# Patient Record
Sex: Male | Born: 1945 | Race: White | Hispanic: No | Marital: Married | Smoking: Former smoker
Health system: Southern US, Community
[De-identification: ages and names within clinical notes are randomized; demographics above are authoritative.]

## PROBLEM LIST (undated history)

## (undated) DIAGNOSIS — Z8739 Personal history of other diseases of the musculoskeletal system and connective tissue: Secondary | ICD-10-CM

## (undated) DIAGNOSIS — R7303 Prediabetes: Secondary | ICD-10-CM

## (undated) DIAGNOSIS — I7121 Aneurysm of the ascending aorta, without rupture: Secondary | ICD-10-CM

## (undated) DIAGNOSIS — Z951 Presence of aortocoronary bypass graft: Secondary | ICD-10-CM

## (undated) DIAGNOSIS — D6851 Activated protein C resistance: Secondary | ICD-10-CM

## (undated) DIAGNOSIS — Z9889 Other specified postprocedural states: Secondary | ICD-10-CM

## (undated) DIAGNOSIS — F1021 Alcohol dependence, in remission: Secondary | ICD-10-CM

## (undated) DIAGNOSIS — M1990 Unspecified osteoarthritis, unspecified site: Secondary | ICD-10-CM

## (undated) DIAGNOSIS — I712 Thoracic aortic aneurysm, without rupture: Secondary | ICD-10-CM

## (undated) DIAGNOSIS — Z86718 Personal history of other venous thrombosis and embolism: Secondary | ICD-10-CM

## (undated) DIAGNOSIS — K5904 Chronic idiopathic constipation: Secondary | ICD-10-CM

## (undated) DIAGNOSIS — N201 Calculus of ureter: Secondary | ICD-10-CM

## (undated) DIAGNOSIS — Z8546 Personal history of malignant neoplasm of prostate: Secondary | ICD-10-CM

## (undated) DIAGNOSIS — E119 Type 2 diabetes mellitus without complications: Secondary | ICD-10-CM

## (undated) DIAGNOSIS — I5032 Chronic diastolic (congestive) heart failure: Secondary | ICD-10-CM

## (undated) DIAGNOSIS — D3500 Benign neoplasm of unspecified adrenal gland: Secondary | ICD-10-CM

## (undated) DIAGNOSIS — Z85828 Personal history of other malignant neoplasm of skin: Secondary | ICD-10-CM

## (undated) DIAGNOSIS — Z923 Personal history of irradiation: Secondary | ICD-10-CM

## (undated) DIAGNOSIS — Z7901 Long term (current) use of anticoagulants: Secondary | ICD-10-CM

## (undated) DIAGNOSIS — Z86010 Personal history of colonic polyps: Secondary | ICD-10-CM

## (undated) DIAGNOSIS — R310 Gross hematuria: Secondary | ICD-10-CM

## (undated) DIAGNOSIS — E042 Nontoxic multinodular goiter: Secondary | ICD-10-CM

## (undated) DIAGNOSIS — IMO0002 Reserved for concepts with insufficient information to code with codable children: Secondary | ICD-10-CM

## (undated) DIAGNOSIS — I10 Essential (primary) hypertension: Secondary | ICD-10-CM

## (undated) DIAGNOSIS — I2510 Atherosclerotic heart disease of native coronary artery without angina pectoris: Secondary | ICD-10-CM

## (undated) DIAGNOSIS — Z860101 Personal history of adenomatous and serrated colon polyps: Secondary | ICD-10-CM

## (undated) DIAGNOSIS — Z9189 Other specified personal risk factors, not elsewhere classified: Secondary | ICD-10-CM

## (undated) DIAGNOSIS — K627 Radiation proctitis: Secondary | ICD-10-CM

## (undated) DIAGNOSIS — I482 Chronic atrial fibrillation, unspecified: Secondary | ICD-10-CM

## (undated) HISTORY — PX: CARDIOVASCULAR STRESS TEST: SHX262

## (undated) HISTORY — PX: TONSILLECTOMY: SUR1361

## (undated) HISTORY — PX: CORONARY ARTERY BYPASS GRAFT: SHX141

## (undated) HISTORY — PX: KNEE SURGERY: SHX244

## (undated) HISTORY — DX: Chronic idiopathic constipation: K59.04

## (undated) HISTORY — PX: TRANSTHORACIC ECHOCARDIOGRAM: SHX275

## (undated) HISTORY — PX: CARDIAC CATHETERIZATION: SHX172

## (undated) HISTORY — DX: Type 2 diabetes mellitus without complications: E11.9

## (undated) HISTORY — DX: Radiation proctitis: K62.7

---

## 1970-11-24 HISTORY — PX: OTHER SURGICAL HISTORY: SHX169

## 1998-04-26 ENCOUNTER — Ambulatory Visit (HOSPITAL_COMMUNITY): Admission: RE | Admit: 1998-04-26 | Discharge: 1998-04-26 | Payer: Self-pay | Admitting: Interventional Cardiology

## 1998-11-24 HISTORY — PX: HEMORRHOID SURGERY: SHX153

## 1999-03-02 ENCOUNTER — Emergency Department (HOSPITAL_COMMUNITY): Admission: EM | Admit: 1999-03-02 | Discharge: 1999-03-03 | Payer: Self-pay | Admitting: Emergency Medicine

## 1999-03-14 ENCOUNTER — Ambulatory Visit (HOSPITAL_COMMUNITY): Admission: RE | Admit: 1999-03-14 | Discharge: 1999-03-14 | Payer: Self-pay | Admitting: Orthopedic Surgery

## 1999-06-14 ENCOUNTER — Encounter: Payer: Self-pay | Admitting: Emergency Medicine

## 1999-06-14 ENCOUNTER — Emergency Department (HOSPITAL_COMMUNITY): Admission: EM | Admit: 1999-06-14 | Discharge: 1999-06-14 | Payer: Self-pay | Admitting: Emergency Medicine

## 1999-09-03 ENCOUNTER — Ambulatory Visit (HOSPITAL_COMMUNITY): Admission: RE | Admit: 1999-09-03 | Discharge: 1999-09-03 | Payer: Self-pay | Admitting: Interventional Cardiology

## 1999-09-14 ENCOUNTER — Encounter: Payer: Self-pay | Admitting: Emergency Medicine

## 1999-09-14 ENCOUNTER — Inpatient Hospital Stay (HOSPITAL_COMMUNITY): Admission: EM | Admit: 1999-09-14 | Discharge: 1999-09-22 | Payer: Self-pay | Admitting: Emergency Medicine

## 1999-09-16 ENCOUNTER — Encounter: Payer: Self-pay | Admitting: Cardiothoracic Surgery

## 1999-09-17 ENCOUNTER — Encounter: Payer: Self-pay | Admitting: Cardiothoracic Surgery

## 1999-09-18 ENCOUNTER — Encounter: Payer: Self-pay | Admitting: Cardiothoracic Surgery

## 1999-09-19 ENCOUNTER — Encounter: Payer: Self-pay | Admitting: Cardiothoracic Surgery

## 1999-10-08 ENCOUNTER — Encounter (HOSPITAL_COMMUNITY): Admission: RE | Admit: 1999-10-08 | Discharge: 2000-01-06 | Payer: Self-pay | Admitting: Interventional Cardiology

## 2000-01-07 ENCOUNTER — Encounter (HOSPITAL_COMMUNITY): Admission: RE | Admit: 2000-01-07 | Discharge: 2000-01-24 | Payer: Self-pay | Admitting: Interventional Cardiology

## 2000-02-21 ENCOUNTER — Ambulatory Visit (HOSPITAL_COMMUNITY): Admission: RE | Admit: 2000-02-21 | Discharge: 2000-02-21 | Payer: Self-pay | Admitting: Critical Care Medicine

## 2000-02-21 ENCOUNTER — Encounter: Payer: Self-pay | Admitting: Critical Care Medicine

## 2000-08-03 ENCOUNTER — Encounter: Payer: Self-pay | Admitting: Gastroenterology

## 2000-08-03 ENCOUNTER — Encounter: Admission: RE | Admit: 2000-08-03 | Discharge: 2000-08-03 | Payer: Self-pay | Admitting: Gastroenterology

## 2000-10-12 ENCOUNTER — Ambulatory Visit (HOSPITAL_COMMUNITY): Admission: RE | Admit: 2000-10-12 | Discharge: 2000-10-12 | Payer: Self-pay | Admitting: Gastroenterology

## 2000-10-12 ENCOUNTER — Encounter (INDEPENDENT_AMBULATORY_CARE_PROVIDER_SITE_OTHER): Payer: Self-pay | Admitting: Specialist

## 2002-08-09 ENCOUNTER — Encounter (INDEPENDENT_AMBULATORY_CARE_PROVIDER_SITE_OTHER): Payer: Self-pay | Admitting: *Deleted

## 2002-08-09 ENCOUNTER — Ambulatory Visit (HOSPITAL_BASED_OUTPATIENT_CLINIC_OR_DEPARTMENT_OTHER): Admission: RE | Admit: 2002-08-09 | Discharge: 2002-08-09 | Payer: Self-pay | Admitting: Orthopedic Surgery

## 2002-08-09 HISTORY — PX: OTHER SURGICAL HISTORY: SHX169

## 2003-02-16 ENCOUNTER — Encounter: Payer: Self-pay | Admitting: Urology

## 2003-02-20 ENCOUNTER — Inpatient Hospital Stay (HOSPITAL_COMMUNITY): Admission: RE | Admit: 2003-02-20 | Discharge: 2003-02-23 | Payer: Self-pay | Admitting: Urology

## 2003-02-20 ENCOUNTER — Encounter (INDEPENDENT_AMBULATORY_CARE_PROVIDER_SITE_OTHER): Payer: Self-pay | Admitting: Specialist

## 2003-02-20 HISTORY — PX: OTHER SURGICAL HISTORY: SHX169

## 2003-11-24 ENCOUNTER — Ambulatory Visit (HOSPITAL_COMMUNITY): Admission: RE | Admit: 2003-11-24 | Discharge: 2003-11-24 | Payer: Self-pay | Admitting: Gastroenterology

## 2003-11-24 ENCOUNTER — Encounter (INDEPENDENT_AMBULATORY_CARE_PROVIDER_SITE_OTHER): Payer: Self-pay | Admitting: Specialist

## 2007-10-15 ENCOUNTER — Ambulatory Visit (HOSPITAL_COMMUNITY): Admission: RE | Admit: 2007-10-15 | Discharge: 2007-10-15 | Payer: Self-pay | Admitting: Orthopedic Surgery

## 2008-04-04 ENCOUNTER — Ambulatory Visit: Payer: Self-pay | Admitting: Hematology & Oncology

## 2008-04-26 LAB — CBC WITH DIFFERENTIAL/PLATELET
BASO%: 0.5 % (ref 0.0–2.0)
Basophils Absolute: 0 10*3/uL (ref 0.0–0.1)
Eosinophils Absolute: 0 10*3/uL (ref 0.0–0.5)
LYMPH%: 26.9 % (ref 14.0–48.0)
MCHC: 34.3 g/dL (ref 32.0–35.9)
MONO#: 0.4 10*3/uL (ref 0.1–0.9)
MONO%: 7.8 % (ref 0.0–13.0)
NEUT%: 64 % (ref 40.0–75.0)
RBC: 4.96 10*6/uL (ref 4.20–5.71)
RDW: 13.8 % (ref 11.2–14.6)
WBC: 5.1 10*3/uL (ref 4.0–10.0)

## 2008-04-26 LAB — CHCC SMEAR

## 2008-04-28 LAB — PROTEIN ELECTROPHORESIS, SERUM
Albumin ELP: 68 % — ABNORMAL HIGH (ref 55.8–66.1)
Alpha-2-Globulin: 8.1 % (ref 7.1–11.8)
Beta Globulin: 6.6 % (ref 4.7–7.2)
Total Protein, Serum Electrophoresis: 7.1 g/dL (ref 6.0–8.3)

## 2008-04-28 LAB — ANA: Anti Nuclear Antibody(ANA): NEGATIVE

## 2008-04-28 LAB — PLATELET ANTIBODIES, DIRECT: Platelet IgG Ab, Direct: NEGATIVE

## 2008-04-28 LAB — LACTATE DEHYDROGENASE: LDH: 182 U/L (ref 94–250)

## 2008-04-28 LAB — VITAMIN B12: Vitamin B-12: 356 pg/mL (ref 211–911)

## 2008-07-04 ENCOUNTER — Ambulatory Visit: Payer: Self-pay | Admitting: Hematology & Oncology

## 2008-08-28 ENCOUNTER — Ambulatory Visit: Payer: Self-pay | Admitting: Vascular Surgery

## 2008-09-04 ENCOUNTER — Encounter: Admission: RE | Admit: 2008-09-04 | Discharge: 2008-09-04 | Payer: Self-pay | Admitting: Internal Medicine

## 2009-04-28 ENCOUNTER — Emergency Department (HOSPITAL_COMMUNITY): Admission: EM | Admit: 2009-04-28 | Discharge: 2009-04-28 | Payer: Self-pay | Admitting: Emergency Medicine

## 2009-05-07 ENCOUNTER — Ambulatory Visit: Payer: Self-pay | Admitting: Vascular Surgery

## 2009-08-21 ENCOUNTER — Encounter (HOSPITAL_COMMUNITY): Admission: RE | Admit: 2009-08-21 | Discharge: 2009-08-21 | Payer: Self-pay | Admitting: Internal Medicine

## 2011-04-08 NOTE — Procedures (Signed)
DUPLEX DEEP VENOUS EXAM - LOWER EXTREMITY   INDICATION:  Left lower extremity pain and swelling.   HISTORY:  Edema:  Left lower extremity.  Trauma/Surgery:  Larey Seat June 5th on left knee.  Pain:  Left lower extremity.  PE:  No.  Previous DVT:  Bilateral superficial femoral and popliteal vein DVT.  Anticoagulants:  Coumadin.  Other:   DUPLEX EXAM:                CFV   SFV   PopV  PTV    GSV                R  L  R  L  R  L  R   L  R  L  Thrombosis    0  0     +     +      0     0  Spontaneous   +  +     +     +      +     +  Phasic        +  +     +     +      +     +  Augmentation  +  +     +     +      +     +  Compressible  +  +     P     P      +     +  Competent     +  0     0     0            +   Legend:  + - yes  o - no  p - partial  D - decreased   IMPRESSION:  1. 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  2. All other imaged veins appear patent.  3. No significant changes from previous study 08/28/2008.          _____________________________  Janetta Hora. Fields, MD   AS/MEDQ  D:  05/07/2009  T:  05/07/2009  Job:  [id number]   cc:   Claude Manges. Cleophas Dunker, M.D.

## 2011-04-08 NOTE — Procedures (Signed)
DUPLEX DEEP VENOUS EXAM - LOWER EXTREMITY   INDICATION:  Evaluate for DVT.   HISTORY:  Edema:  Left leg swelling when patient flies.  Trauma/Surgery:  No.  Pain:  No.  PE:  No.  Previous DVT:  Bilateral lower extremity DVT 20 years ago.  Anticoagulants:  Yes.  Other:    DUPLEX EXAM:                CFV   SFV   PopV  PTV    GSV                R  L  R  L  R  L  R   L  R  L  Thrombosis    o  o  +  +  +  +  o   o  o  o  Spontaneous   +  +  +  +  +  +  +   +  +  +  Phasic        +  +  +  +  +  +  +   +  +  +  Augmentation  +  +  +  +  +  +  +   +  +  +  Compressible  +  +  P  P  P  P  +   +  +  +  Competent                                 +   Legend:  + - yes  o - no  p - partial  D - decreased   IMPRESSION:  1. No evidence of acute deep venous thrombosis noted in the bilateral      lower extremities.  2. Mild areas of minimal occlusive chronic thrombus noted in the      bilateral superficial femoral and popliteal veins.   Preliminary report was faxed to Dr. Petrinitz's office on 08/28/08.      _____________________________  Janetta Hora. Fields, MD   CH/MEDQ  D:  08/28/2008  T:  08/28/2008  Job:  [id number]

## 2011-04-11 NOTE — Op Note (Signed)
NAME:  Jacob [name], Jacob [name] NO.:  [id number]   MEDICAL RECORD NO.:  [id number]                   PATIENT TYPE:  AMB   LOCATION:  DSC                                  FACILITY:  [facility]   PHYSICIAN:  Katy Fitch. Naaman Plummer., M.D.          DATE OF BIRTH:  1946-02-24   DATE OF PROCEDURE:  08/09/2002  DATE OF DISCHARGE:                                 OPERATIVE REPORT   PREOPERATIVE DIAGNOSIS:  A 2.5 x 1.5 cm subcutaneous, subdermal, and  perifascial mass, left distal brachium.   POSTOPERATIVE DIAGNOSIS:  A 2.5 x 1.5 cm subcutaneous, subdermal, and  perifascial mass, left distal brachium.   PROCEDURE:  Excisional biopsy of mass, left distal brachium.   SURGEON:  Katy Fitch. Sypher, M.D.   ASSISTANT:  Jonni Sanger, P.A.   ANESTHESIA:  1% lidocaine and 0.25% Marcaine field block supplemented by IV  sedation supervised by the anesthesiologist, Janetta Hora. Gelene Mink, M.D.   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   He noticed this while showering several weeks ago and at one point noted  what he thought was some inflammation.   He was evaluated by Barbette Or, M.D., primary care physician, who  placed him on oral Keflex.  On the medication the patient thought his mass  may have become less inflamed; however, the mass became quite firm and  persisted.   He presented for an upper extremity orthopedic consult one week ago and at  that time was advised to observe this for another seven days.   With no significant change in the mass measurement, I recommended proceeding  with excisional biopsy for diagnosis.   His past medical history reveals a history of gout.  He is currently managed  on allopurinol.  He has had no episodes of acute gouty arthropathy or signs  of soft tissue tophus formation in the past.  In addition, he  has chronic  coronary artery disease and is status post coronary artery bypass graft.   DESCRIPTION OF PROCEDURE:  The patient is brought to the operating room and  placed in the supine position on the operating table.   Following light sedation, the left arm was prepped with Betadine soap and  solution and sterilely draped.  A pneumatic tourniquet was applied to the  proximal brachium.   Following exsanguination of the limb with an Esmarch bandage, an arterial  tourniquet on the proximal brachium was inflated to 220 mmHg.  The procedure  commenced with infiltration of 1% lidocaine and 0.25% Marcaine both with  epinephrine into the region of the intended incision and surrounding the  mass.   After waiting for approximately five minutes for proper analgesia, the mass  was exposed through a transverse incision directly over the palpable  fullness.  The subcutaneous tissues were carefully divided, revealing what  appeared to be a moderately firm inflammatory scirrhous mass, adherent to  the deep surface of the dermis and the superficial and deep fascia.  This  was circumferentially dissected and removed as a single entity, placed in  formalin, and passed off for pathologic evaluation.   The wound was then palpated for other satellite masses, and none were  identified.  The wound was then thoroughly lavaged with sterile saline,  subsequently repaired with intradermal 3-0 Prolene and a Steri-Strip.   A pressure dressing was applied with sterile gauze, a sterile ABD pad, and a  four-inch Ace wrap.   The patient was advised to elevate his arm for 48 hours.  He will remove his  dressing in 72 hours and apply a Band-Aid.  We should have the biopsy report  back within 72-96 hours.  Note that he is scheduled to return to our office  for follow-up in one week for suture removal.                                               Katy Fitch. Naaman Plummer., M.D.    RVS/MEDQ  D:  08/09/2002  T:   08/10/2002  Job:  [id number]   cc:   Barbette Or, MD  [address] [place]  [place] [address]  Fax: [phone]

## 2011-04-11 NOTE — Op Note (Signed)
NAME:  Jacob [name], Jacob [name] NO.:  [id number]   MEDICAL RECORD NO.:  [id number]                   PATIENT TYPE:  INP   LOCATION:  X003                                 FACILITY:  [facility]   PHYSICIAN:  Valetta Fuller, M.D.               DATE OF BIRTH:  May 20, 1946   DATE OF PROCEDURE:  DATE OF DISCHARGE:                                 OPERATIVE REPORT   PREOPERATIVE DIAGNOSES:  Clinical stage T1C adenocarcinoma of the prostate.   POSTOPERATIVE DIAGNOSES:  Clinical stage T1C adenocarcinoma of the prostate.   PROCEDURE:  Radical retropubic prostatectomy.   SURGEON:  Valetta Fuller, M.D.   ASSISTANT:  Bertram Millard. Dahlstedt, M.D.   ANESTHESIA:  General endotracheal.   INDICATIONS FOR PROCEDURE:  Mr. More is a 65 year old male. He was  recently noted to have a mildly elevated PSA of approximately 4.2. Digital  rectal exam was unremarkable and he was asymptomatic. The patient  subsequently had ultrasound and biopsy which revealed bilateral  adenocarcinoma of the prostate with a Gleason score of 3+3=6. Biopsies were  positive bilaterally but he had less than 10% of the biopsy material  involved with cancer. Given this PSA of well less than 10 and the Gleason 6  tumor, we did not feel CT and bone scan were indicated. The patient does  have some medical comorbidities but has been assessed and is felt to be a  reasonably good candidate for surgical intervention. The patient underwent  extensive counseling with regard to treatment options and specifically we at  length discussed the pros and cons of radical prostatectomy versus radiation  approach such as interstitial seed implantation. 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 We felt that given his  exceedingly low numbers and very low risk for pelvic lymph node metastases  that we could do away with the  pelvic lymph node dissection which would  shorten the procedure and also avoid dissection of the iliac vessels which  could potentially result in some trauma and increase his risk for DVT. After  much discussion, the patient elected to proceed with radical retropubic  prostatectomy. He had his Coumadin discontinued approximately 5-6 days ago  and has been on a Lovenox bridge for his anticoagulation with his last dose  last evening. His coag status is now normal. His labs are unremarkable and  he present now for the procedure.   TECHNIQUE:  The patient was brought to the operating room where he was  placed in the supine position. Compression beads were placed preoperatively  and compression was initiated before induction of anesthesia. Once  successful induction of general endotracheal anesthesia had occurred, the  patient was then prepped and draped in the usual manner. A Foley catheter  was inserted. We had difficulty establishing drainage  with a straight  catheter and a Coude catheter was applied. A standard lower midline incision  was performed. An Omni retractor was then utilized and the retropubic space  was dissected. Once the bladder was decompressed, we exposed the endopelvic  fascia bilaterally which was then incised. The apex of the prostate was  easily identified and there was no evidence of induration on either side. We  felt that a bilateral nerve sparing approach was reasonable in this case. A  back bleeder stitch to the dorsal vein was utilized. The puboprostatics were  easily identified and incised. A right angled clamp was placed behind the  dorsal vein complex and above the urethra. This was doubly ligated and then  suture ligated. The dorsal vein complex was then incised and a very nice  apical dissection ensued. One could identify the apex out the underlying  urethra. We gently dissected out the proximal urethral stump which was then  transected and the catheter was  brought out the incision. The posterior  aspect of the urethra was then transected and again a nice urethral stump  was retained. A small amount of rectourethralis fascia needed to be incised  utilizing sharp dissection with a right angle underneath that area. The  posterior plane of the prostate was then very easily established and there  was no evidence of difficulty in getting the posterior plane. The endopelvic  fascia was then incised sharply bilaterally with the neurovascular bundles  allowed to release. In the midline, we were able to identify the seminal  vesicle which then allowed [place] to easily establish a plane between the  seminal vesicles and the lateral pedicles of the prostate which were then  either clipped with hemolock clips or tied with silk suture. Once we had all  the pedicles taken down, we turned our attention to the bladder neck.  Utilizing a combination of sharp and blunt dissection technique, we were  able to do a complete bladder neck sparing. Those planes between the  prostate and bladder neck were also normal. The entire circular fibers of  the bladder neck were preserved. In the midline, the vas were identified,  clipped and transected. Both seminal vesicles were then dissected out  completely with the seminal vesicle vessels clipped with locking clips. The  prostate was then removed. Hemostasis was quite good. There was a slight  amount of oozing but nothing unusual. Bladder mucosa was everted with 4-0  Vicryl suture. The bladder neck did not need to be closed and admitted the  tip of my little finger. Utilizing the sound in the urethra, we were able to  identify a very nice urethral stump and five anastomotic sutures of 2-0  Vicryl were placed in anatomic positions and then placed at the  corresponding positions at the bladder neck. The reanastomosis was then done  over a 22 French Foley catheter. At the completion of the anastomosis, no obvious leakage  occurred on irrigation. A Jackson-Pratt drain was placed  through a separate stab incision. The pelvis and wound was copiously  irrigated. Marcaine was infiltrated. The fascia was closed with a #1 PDS and  the skin was closed with clips. The total procedure time was about an hour  and a half. Estimated blood loss was about 700 mL and the patient appeared  to tolerate the procedure well. Sponge, needle and instrument counts were  correct and he was brought to the recovery room in stable condition.  Valetta Fuller, M.D.    DSG/MEDQ  D:  02/20/2003  T:  02/20/2003  Job:  [id number]

## 2011-04-11 NOTE — H&P (Signed)
NAME:  Jacob [name], PROCH NO.:  [id number]   MEDICAL RECORD NO.:  [id number]                   PATIENT TYPE:  INP   LOCATION:  X003                                 FACILITY:  [facility]   PHYSICIAN:  Valetta Fuller, M.D.               DATE OF BIRTH:  1946-11-16   DATE OF ADMISSION:  02/20/2003  DATE OF DISCHARGE:                                HISTORY & PHYSICAL   ADMITTING DIAGNOSES:  Clinical stage T1c adenocarcinoma of the prostate for  radical retropubic prostatectomy.   HISTORY OF PRESENT ILLNESS:  The patient is a 65 year old male.  He was sent  to me with a mildly elevated PSA of just over 4.  Biopsies revealed  bilateral Gleason 3+3=6 adenocarcinoma of the prostate.  The 10% of the  biopsy material was positive on both sides.  We did not feel additional  staging studies were required.  We talked to the patient extensively about  treatment options.  He elected to proceed with radical retropubic  prostatectomy for management of his cancer.  He was completely asymptomatic.  Of note, the patient is chronically on Coumadin for a history of DVT and  factor V Leiden deficiency.  He has been converted on a Lovenox bridge which  was discontinued.   PAST MEDICAL HISTORY:  1. Coronary artery disease.  He had a Cardiolite stress test about three to     four months ago which he did well with.  2. He also has a history of chronic atrial fibrillation.  3. He has had bypass surgery.  4. He has also had a history of DVT in the 1980s.   CURRENT MEDICATIONS:  1. Zocor.  2. Coumadin which has been on hold.  3. Allopurinol.  4. Folic acid.  5. Cardizem.  6. Altace.  7. Coreg.   ALLERGIES:  He has no drug allergies, but has an intolerance to PERCOCET.   SOCIAL HISTORY:  He has a previous tobacco use history.  He did have a  previous alcohol consumption history, but has not had any alcohol for  approximately four years.   REVIEW OF SYSTEMS:  Otherwise  noncontributory.   PHYSICAL EXAMINATION:  GENERAL:  He is a well-developed, well-nourished  male.  His current weight is approximately 245 pounds.  His blood pressure  is 110/78 with a pulse of 76.  Room air saturation was 98%.  NECK:  Without JVD.  CHEST:  Clear to auscultation.  ABDOMEN:  Soft.  He had a couple of small bruised areas of ecchymosis  consistent with his Lovenox injections.  GENITOURINARY:  External genitalia shows normal penis, scrotum, testes,  adnexal structures.  His prostate is 1+ without worrisome nodules or  induration.  EXTREMITIES:  No edema.   DATA:  Hemoglobin was 15.7.  Platelet count was slightly reduced at 110,000.  His PT and INR were within normal limits and BMET was also  unremarkable.   ASSESSMENT:  Clinical stage T1c adenocarcinoma of the prostate.  The patient  is to undergo radical retropubic prostatectomy this morning and hopefully be  admitted for routine postoperative care.                                               Valetta Fuller, M.D.    DSG/MEDQ  D:  02/20/2003  T:  02/20/2003  Job:  [id number]

## 2011-04-11 NOTE — Discharge Summary (Signed)
NAME:  Jacob [name], Jacob [name] NO.:  [id number]   MEDICAL RECORD NO.:  [id number]                   PATIENT TYPE:  INP   LOCATION:  1610                                 FACILITY:  [facility]   PHYSICIAN:  Valetta Fuller, M.D.               DATE OF BIRTH:  12/03/1945   DATE OF ADMISSION:  02/20/2003  DATE OF DISCHARGE:  02/23/2003                                 DISCHARGE SUMMARY   DISCHARGE DIAGNOSES:  1. Clinical stage T1C and final pathologic stage P T3A adenocarcinoma of the     prostate.  2. History of deep vein thrombosis.  3. Thrombocytopenia.   PROCEDURE PERFORMED:  Radical retropubic prostatectomy on February 20, 2003.   HOSPITAL COURSE:  The patient is a 65 year old male.  He was sent to me with  a mildly elevated PSA of approximately 4.  Biopsies revealed bilateral  Gleason's 3+3=6 adenocarcinoma of the prostate.  The patient underwent  extensive counseling with regard to treatment options.  He was felt to be a  good candidate for radical retropubic prostatectomy, although he did have a  personal history of DVT and had been documented to have a factor V Leiden  deficiency.  He has a history of chronic atrial fibrillation and a history  of DVT and therefore is on Coumadin.  We had Candyce Churn, M.D.  involved and patient had been converted to Lovenox prior to his surgical  procedure.  That was discontinued approximately 24 hours prior to surgery.  On February 20, 2003 the patient underwent an uneventful radical retropubic  prostatectomy.  Given his very low PSA and Gleason's 6 tumor we did not feel  that lymph node dissection was necessary and felt that that would  potentially increase his risk of DVT.  The surgery itself was fairly  uncomplicated.  Estimated blood loss was approximately 700 mL.  Postoperatively he remained stable.  His urine continued to drain clear  urine.  He was noted postoperatively to have reduced platelet count of  approximately 73,000.  His hemoglobin was approximately 10.2.  Compression  stockings were utilized but given his low platelet count we elected not to  restart his anticoagulation at that time.  Early ambulation was encouraged.  The patient was able to resume a diet.  Urine output remained excellent and  JP drainage minimal.  He continued to have a reduced platelet count for  several days postoperatively with a platelet count of approximately 70,000.  Hemoglobin remained stable.  The patient was kept until postoperative day  three.  At that time he was up and ambulating well.  He was tolerating a  general diet well and had a bowel movement.  His examination was  unremarkable.   DISPOSITION:  The patient was discharged to home with a catheter to  drainage.  He will be sent home with some Vicodin.  He is to follow up with  Candyce Churn, M.D. in the next day or two to reassess platelet count  and hemoglobin.  Candyce Churn, M.D. will then resume his Lovenox and  Coumadin when he feels that it is  appropriate.  The patient will be set up to see me in a week for staple  removal, approximately 10 days for catheter removal.  Of note, his final  pathology did reveal extensive involvement in the right lobe and only focal  omental involvement in the left lobe.  Adjacent to the right seminal vesicle  there was a focal area of extracapsular extension.                                               Valetta Fuller, M.D.    DSG/MEDQ  D:  03/22/2003  T:  03/22/2003  Job:  [id number]   cc:   Candyce Churn, M.D.  [address] [place]  [place] [address]  Fax: [phone]

## 2011-04-11 NOTE — Cardiovascular Report (Signed)
[place]  Patient:    Jacob [name]                         MRN: [id number] Proc. Date: 09/03/99 Adm. Date:  [id number] Attending:  Lyn Records. Iii CC:         Pearla Dubonnet, M.D.             Celso Sickle, M.D.             Gwenith Daily Tyrone Sage, M.D.                        Cardiac Catheterization  CINE NUMBER:  [id number]  INDICATIONS FOR PROCEDURES:  Abnormal stress Cardiolite demonstrating anterior all ischemia and a fixed inferior wall defect.  This study is being done to rule out progression of coronary disease.  PROCEDURES PERFORMED: 1. Left heart catheterization. 2. Selective coronary angiography. 3. Left ventriculography.  EQUIPMENT:  #4 6-French left Judkins catheter, #4 6-French right Judkins catheter, and a 6-French angled pigtail catheter.  A 6-French A2 multipurpose catheter was inadequate to complete the case.  DESCRIPTION OF PROCEDURE:  After informed consent and following 2 mg of IV Versed, 2% Xylocaine was used to achieve local anesthesia.  The patient then underwent placement of an arterial sheath using the modified Seldinger technique.  A 6-French A2 multipurpose catheter was then used for hemodynamic recordings, but was unable to cross the aortic valve or cannulate the coronaries due to aortic ectasia. We then switched out to Judkins catheters for coronary angiography and angled pigtail catheter for ventriculography.  The guide wire was necessary at the end of the eft ventricle.  The patient tolerated the procedure without complications.  RESULTS:    I. HEMODYNAMIC DATA:       a. Left ventricular pressure:  134/21.       b. Aortic pressure:  134/82.   II. LEFT VENTRICULOGRAPHY:  The left ventricle is normal in size to mildly      dilated.  Overall contractility is normal.  The estimated ejection fraction      is 55% to 65%.  III. SELECTIVE CORONARY ANGIOGRAPHY:  All three coronaries,  especially in the      proximal segments, but in the LAD extending into the mid segment, contained      heavy circumferential calcification that is demonstrable by cine fluoroscopy.       a. Left main:  No significant obstructive lesions.       b. Left anterior descending coronary:  The left anterior descending coronary          artery is large and is free of significant obstruction in the proximal  segment where there is extremely heavy calcification.  In the mid vessel,          there is an eccentric 85% to 90% stenosis.  The remainder of the mid and          distal LAD is free of any significant obstruction.  Collaterals via the          septal perforator branches fill the distal right coronary.       c. Circumflex artery:  The circumflex artery is large.  It contains          significant proximal calcification.  There is a 65% to 80% proximal          segmental stenosis in the  first obtuse marginal.  A much small second          obtuse marginal is totally occluded in its mid portion and fills by          left-to-left collaterals.  The mid circumflex and the distal two obtuse          marginal branches are free of any significant obstruction.  Again, it oes          appear that collaterals from the distal left circumflex help to fill the          distal right coronary by collaterals.       d. Right coronary artery:  The right coronary artery contains heavy proximal          calcification.  There is total occlusion of the proximal right coronary.          This segment is heavily calcified.  The mid and distal right coronary          fills late by right-to-right bridging collaterals.  The distal right          coronary is large.  CONCLUSIONS: 1. Significant coronary atherosclerotic heart disease with an eccentric calcified    80% mid left anterior descending artery stenosis and total occlusion of the    proximal right coronary.  The right coronary fills late by collaterals  from oth    the right coronary proximal segment and also the left circumflex and left    anterior descending artery.  There is also a significant lesion in the first  obtuse marginal branch, with total occlusion of a very small second obtuse    marginal branch. 2. Preserved left ventricular systolic function, with evidence of diastolic    function noted by elevated end-diastolic pressure.  RECOMMENDATION:  Coronary artery bypass grafting. DD:  09/03/99 TD:  09/04/99 Job: [id number]

## 2011-04-11 NOTE — H&P (Signed)
[place]. [facility]  Patient:    Jacob [name]                         MRN: [id number] Adm. Date:  [id number] Attending:  Lyn Records. Iii CC:         Pearla Dubonnet, M.D.             Darci Needle, M.D.             Gwenith Daily Tyrone Sage, M.D.                         History and Physical  REASON FOR ADMISSION:  Shoulder discomfort.  HISTORY OF PRESENT ILLNESS:  This 65 year old male has a history of hypertension, chronic atrial fibrillation on Coumadin, hyperlipidemia, and asymptomatic coronary artery disease. His heart history goes back several years when he had a syncopal episode and wound up having a catheterization showing some moderate coronary disease which has been followed over the years. He recently had had an abnormal  adenosine Cardiolite showing a small focus of anterior ischemia. Repeat catheterization done by Dr. Katrinka Blazing recently showed heavily calcified left main and LAD vessels. The right coronary artery was 100% occluded. LV function was normal. The LAD had calcification and an 85 to 90% mid vessel stenosis. Collaterals in he ______  filled the distal right coronary artery with a 65 to 80% proximal marginal stenosis. Second marginal branch is occluded and fills the left ______ collaterals. Mid circumflex and distal two marginal branches were free of obstruction. Right coronary artery was occluded and filled by collaterals from he left coronary system. Dr. Katrinka Blazing felt that he would best be served with coronary  bypass grafting, and arrangements were made for him to see Dr. Tyrone Sage.  He had stopped his Coumadin with the last dose being Wednesday in anticipation f surgery this next Tuesday. He and wife were out at a movie this evening. He had the vague onset of left shoulder and upper arm aching type symptoms and some in the  scapular region. The symptoms were somewhat vague but were worrisome to him and  would wax and  wane. He presented to the emergency room where an EKG showed no acute changes. He had not previously had chest pressure, tightness, or heaviness or any symptoms of coronary disease. He has also had previous known DVT. In view of his known coronary artery disease and a coagulation abnormality, previous DVT, and atrial fibrillation, he is admitted at this time to institute anticoagulation and monitoring to rule out unstable angina and in anticipation of upcoming coronary  bypass grafting.  PAST MEDICAL HISTORY:  Prior history of hypertension. He has known hyperlipidemia. There is a history of gout. There is a history of some alcohol abuse. There is history of DVT in the past. He was diagnosed with factor V Leiden deficiency in  1997. There is a history of some back spasms and some skin cancer and some shingles.  PAST SURGICAL HISTORY:  Tonsillectomy and lumbar laminectomy. Left knee surgery.  ALLERGIES:  None.  CURRENT MEDICATIONS: 1. Lipitor 10 mg daily. 2. Atenolol 50 mg daily. 3. Cardizem 300 mg daily. 4. He has been off of Coumadin since last Wednesday. 5. He has also been on allopurinol 100 mg daily for gout. 6. Has been taking colchicine 0.6 mg daily.  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 He quit smoking 12 years ago. He has had an extensive alcohol history in the past. Drank heavily for 25 years until December 1994. He had an alcoholic binge earlier this year but is not currently drinking at this time.  FAMILY HISTORY:  Positive for heart disease. Father died of an MI at age 84. First MI was in his 9s. Mother died of CHF and valvular disease.  REVIEW OF SYSTEMS:  Since his previous catheterization two weeks ago, denies any other symptoms or problems.  PHYSICAL EXAMINATION:  GENERAL:  He is a slightly obese male who is mildly  anxious.  VITAL SIGNS:  Blood pressure is 130/80, pulse is 60 and irregular.  SKIN:  Warm and dry. Changes of chronic venous stasis are noted.  ENT/NECK:  No JVD. No carotid bruits.  LUNGS:  Clear.  CARDIAC:  Irregular rhythm. No S3 or murmur.  ABDOMEN:  Obese and soft.  EXTREMITIES:  There are changes of chronic venous stasis in both lower extremities. Pedal pulses are 2+.  LABORATORY DATA:  Twelve-lead ECG shows atrial fibrillation with moderate nonspecific ST-T wave changes.  CPK is slightly elevated with an MB of 4. CBC was normal.  IMPRESSION: 1. Vague left shoulder pain, not associated with tenderness in the shoulder area.    Rule out an atypical presentation of ischemia. No ECG abnormalities are noted. 2. Severe three-vessel coronary artery disease awaiting surgery on Tuesday by    Gwenith Daily. Tyrone Sage, M.D. 3. Hyperlipidemia under treatment. 4. Hypertensive heart disease. 5. History of deep venous thrombosis with venous insufficiency. 6. Factor V Leiden deficiency. 7. Gout. 8. Prior history of alcohol abuse, none currently.  RECOMMENDATIONS:  Dr. Tyrone Sage was notified of the patients admission. He will e admitted. We will give a single dose of aspirin, begin Lovenox per pharmacy protocol in preparation for upcoming surgery to cover with anticoagulation. Continue beta blockers. Rule out MI with serial CPK and EKG. If recurrent symptoms, IV nitroglycerin. DD:  09/14/99 TD:  09/15/99 Job: [id number]

## 2011-04-11 NOTE — Discharge Summary (Signed)
[place]. [facility]  Patient:    Jacob [name]                         MRN: [id number] Adm. Date:  [id number] Disc. Date: 09/22/99 Attending:  Lyn Records. Iii Dictator:   Loura Pardon, P.A. CC:         Gwenith Daily. Tyrone Sage, M.D.             Pearla Dubonnet, M.D.             Darci Needle, M.D.                           Discharge Summary  DATE OF BIRTH:  [birth year]  ATTENDING SURGEON:  Dr. Sheliah Plane.  PRIMARY CAREGIVER:  Dr. Robley Fries.  CARDIOLOGIST:  Dr. Darci Needle.  FINAL DIAGNOSIS:  Atherosclerotic coronary artery disease.  SECONDARY DIAGNOSES:  1. History of chronic atrial fibrillation.  2. History of deep venous thrombosis.  3. Factor V Leiden deficiency.  4. 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  5. Gout.  6. Status post lumbar fusion.  7. Hypertension.  8. Hypercholesterolemia.  9. History of skin cancer and chronic back pain. 10. Herpes zoster November 1998. 11. Status post tonsillectomy. 12. Status post left knee arthroplasty.  PROCEDURE:  September 17, 1999:  Coronary artery bypass graft surgery x 2, Sheliah Plane, Careers adviser.  In this procedure the left internal mammary artery was connected in an end-to-side fashion to the left anterior descending coronary artery and the right internal mammary artery was connected in an end-to-side fashion to the right coronary artery.  DISCHARGE DISPOSITION:  Jacob [name] is judged a suitable candidate for discharge on postoperative day #5, October 29.  He has remained afebrile in the  postoperative period, his wounds are healing nicely, he is ambulating without desaturation, he is taking oral nourishment and tolerate it well, and he has full GI tract function.  He has been alert and oriented without confusion in the postoperative period.  For a brief period after the surgery he was in a sinus rhythm; however, he converted quickly on the  evening of postoperative day #1 to  atrial fibrillation and has remained in atrial fibrillation throughout his recovery period here at [facility].  He was started on Coumadin on postoperative day #2 nd has received three doses prior to his discharge on 7.5 mg q.d.  DISCHARGE MEDICATIONS:  He goes home on the following medications: 1. Darvocet-N 100 one to two tabs p.o. q.3-4h. p.r.n. pain. 2. Coumadin 7.5 mg q.d. 3. Lopressor 50 mg one-half tab in the a.m., one-half tab in the p.m. 4. Pepcid 20 mg h.s. 5. Folic acid 1 mg q.d. 6. Colchicine 0.6 mg q.d. 7. Lipitor 10 mg h.s.  DISCHARGE INSTRUCTIONS:  Activity:  Ambulate as tolerated.  Is asked not to lift any weight more than 10 pounds for the next four to six weeks.  He is asked not to drive until he sees his cardiologist, Dr. Darci Needle.  Wound care:  He may shower daily keeping his incision clean and dry.  DISCHARGE DIET:  Low-sodium low-cholesterol diet.  FOLLOWUP:  He will see Dr. Katrinka Blazing in the office two weeks from his discharge. He is asked to call [phone] to arrange the appointment.  Chest x-ray will be taken at that time.  He also has an office visit with Dr. Tyrone Sage scheduled for three weeks after discharge and the office of [facility] will call him to arrange this appointment. He is to bring the chest x-ray to this visit.  ADMISSION HISTORY:  Jacob [name] is a 65 year old male with a known history of atherosclerotic coronary artery disease.  He has a history also of recurrent atrial fibrillation and chronic Coumadin therapy.  He also has a history of deep venous thrombosis and factor V Leiden deficiency.  He has had no classic anginal symptoms; however, he did have a Cardiolite stress test which was administered by Dr. Verdis Prime.  The study showed anterior ischemia.  He also had a left heart catheterization which was performed September 03, 1999.  This study showed normal  left ventricular  contractility with an ejection fraction of 55%.  The left anterior descending had extensive circumferential calcification with an 85-90% stenosis t the midpoint.  The left circumflex coronary artery contains a proximal calcification with a 70-80% proximal lesion at the first obtuse marginal and a second obtuse marginal is totally occluded.  Collaterals from the circumflex help fill the distal right coronary artery.  The right coronary artery is totally occluded proximally.  HOSPITAL COURSE:  Jacob [name] was admitted to [facility] on October 21 with a diagnosis of atherosclerotic coronary artery disease.  He had  stopped taking his Coumadin prior admission, and he was maintained on a subcu dose of Lovenox.  On October 24 he underwent coronary artery bypass graft surgery x by Sheliah Plane.  The details of the procedure have been dictated above.  On postoperative day #1 his cardiac index was 2.73.  He had a good urine output, hematocrit of 32%.  He was initially in sinus rhythm, but converted to his atrial fibrillation in the evening of postoperative day #1.  Postoperative day #2, he as in atrial fibrillation with rapid ventricular rate which was controlled on Cardizem and Lopressor.  He was relieved of all supplemental oxygen by postoperative day #2, chest tubes were removed, hematocrit was 27.5%, and INR was 1.5.  He was started on a dose of 5 mg Coumadin.  By postoperative day #3 his atrial fibrillation continued.  He was totally asymptomatic with this.  He had a room air oxygen saturation of 95%.  His wounds were healing nicely, he remained afebrile, and his INR was still 1.5.  By postoperative day #4 he was ambulating without desaturation, his appetite was continuing to improve, his incision was without swelling, erythema, or drainage, and he was judged a suitable candidate for discharge on postoperative day #5, October 29, going home with his home dose of  Coumadin 7.5 mg. DD:  09/21/99 TD:  09/22/99 Job: [id number]

## 2011-08-06 ENCOUNTER — Ambulatory Visit
Admission: RE | Admit: 2011-08-06 | Discharge: 2011-08-06 | Disposition: A | Discharge status: Home / Self Care | Payer: Commercial Managed Care - PPO | Source: Ambulatory Visit | Attending: Radiation Oncology | Admitting: Radiation Oncology

## 2011-08-06 DIAGNOSIS — Z79899 Other long term (current) drug therapy: Secondary | ICD-10-CM | POA: Insufficient documentation

## 2011-08-06 DIAGNOSIS — C61 Malignant neoplasm of prostate: Secondary | ICD-10-CM | POA: Insufficient documentation

## 2011-08-06 DIAGNOSIS — E785 Hyperlipidemia, unspecified: Secondary | ICD-10-CM | POA: Insufficient documentation

## 2011-08-06 DIAGNOSIS — Z951 Presence of aortocoronary bypass graft: Secondary | ICD-10-CM | POA: Insufficient documentation

## 2011-08-06 DIAGNOSIS — Z7901 Long term (current) use of anticoagulants: Secondary | ICD-10-CM | POA: Insufficient documentation

## 2011-08-06 DIAGNOSIS — I4891 Unspecified atrial fibrillation: Secondary | ICD-10-CM | POA: Insufficient documentation

## 2011-08-06 DIAGNOSIS — I2510 Atherosclerotic heart disease of native coronary artery without angina pectoris: Secondary | ICD-10-CM | POA: Insufficient documentation

## 2012-02-02 ENCOUNTER — Other Ambulatory Visit: Payer: Self-pay

## 2012-02-02 DIAGNOSIS — C61 Malignant neoplasm of prostate: Secondary | ICD-10-CM

## 2012-02-12 ENCOUNTER — Encounter (HOSPITAL_COMMUNITY): Payer: Self-pay

## 2012-02-12 ENCOUNTER — Encounter (HOSPITAL_COMMUNITY)
Admission: RE | Admit: 2012-02-12 | Discharge: 2012-02-12 | Disposition: A | Discharge status: Home / Self Care | Payer: Commercial Managed Care - PPO | Source: Ambulatory Visit | Attending: Radiology | Admitting: Radiology

## 2012-02-12 DIAGNOSIS — C7951 Secondary malignant neoplasm of bone: Secondary | ICD-10-CM | POA: Insufficient documentation

## 2012-02-12 DIAGNOSIS — C7952 Secondary malignant neoplasm of bone marrow: Secondary | ICD-10-CM | POA: Insufficient documentation

## 2012-02-12 DIAGNOSIS — C61 Malignant neoplasm of prostate: Secondary | ICD-10-CM | POA: Insufficient documentation

## 2012-02-12 MED ORDER — FLUDEOXYGLUCOSE F - 18 (FDG) INJECTION
16.1000 | Freq: Once | INTRAVENOUS | Status: AC | PRN
  Administered 2012-02-12: 16.1 via INTRAVENOUS

## 2012-06-02 ENCOUNTER — Other Ambulatory Visit: Payer: Self-pay | Admitting: Interventional Cardiology

## 2012-06-02 DIAGNOSIS — I719 Aortic aneurysm of unspecified site, without rupture: Secondary | ICD-10-CM

## 2012-08-23 ENCOUNTER — Other Ambulatory Visit: Payer: Self-pay | Admitting: Internal Medicine

## 2012-08-23 DIAGNOSIS — C61 Malignant neoplasm of prostate: Secondary | ICD-10-CM

## 2012-08-25 ENCOUNTER — Ambulatory Visit
Admission: RE | Admit: 2012-08-25 | Discharge: 2012-08-25 | Disposition: A | Discharge status: Home / Self Care | Payer: Commercial Managed Care - PPO | Source: Ambulatory Visit | Attending: Internal Medicine | Admitting: Internal Medicine

## 2012-08-25 DIAGNOSIS — C61 Malignant neoplasm of prostate: Secondary | ICD-10-CM

## 2012-08-25 MED ORDER — IOHEXOL 300 MG/ML  SOLN
75.0000 mL | Freq: Once | INTRAMUSCULAR | Status: AC | PRN
  Administered 2012-08-25: 75 mL via INTRAVENOUS

## 2012-12-14 ENCOUNTER — Other Ambulatory Visit: Payer: Self-pay | Admitting: Dermatology

## 2013-01-12 ENCOUNTER — Encounter (HOSPITAL_COMMUNITY): Payer: Self-pay

## 2013-01-13 ENCOUNTER — Encounter (HOSPITAL_COMMUNITY)
Admission: RE | Admit: 2013-01-13 | Discharge: 2013-01-13 | Disposition: A | Discharge status: Home / Self Care | Payer: Commercial Managed Care - PPO | Source: Ambulatory Visit | Attending: Orthopaedic Surgery | Admitting: Orthopaedic Surgery

## 2013-01-13 ENCOUNTER — Encounter (HOSPITAL_COMMUNITY): Payer: Self-pay

## 2013-01-13 HISTORY — DX: Atherosclerotic heart disease of native coronary artery without angina pectoris: I25.10

## 2013-01-13 HISTORY — DX: Essential (primary) hypertension: I10

## 2013-01-13 HISTORY — DX: Activated protein C resistance: D68.51

## 2013-01-13 LAB — BASIC METABOLIC PANEL
BUN: 16 mg/dL (ref 6–23)
Chloride: 107 mEq/L (ref 96–112)
GFR calc Af Amer: >90 mL/min (ref >90)
GFR calc non Af Amer: >90 mL/min (ref >90)
Glucose, Bld: 98 mg/dL (ref 70–99)
Potassium: 4.6 mEq/L (ref 3.5–5.1)
Sodium: 144 mEq/L (ref 135–145)

## 2013-01-13 LAB — HEPATIC FUNCTION PANEL
ALT: 27 U/L (ref 0–53)
AST: 30 U/L (ref 0–37)
Albumin: 4.2 g/dL (ref 3.5–5.2)

## 2013-01-13 LAB — TYPE AND SCREEN
ABO/RH(D): A NEG
Antibody Screen: NEGATIVE

## 2013-01-13 LAB — CBC
HCT: 40.7 % (ref 39.0–52.0)
Hemoglobin: 13.2 g/dL (ref 13.0–17.0)
MCHC: 32.4 g/dL (ref 30.0–36.0)
RBC: 4.78 MIL/uL (ref 4.22–5.81)

## 2013-01-13 LAB — PROTIME-INR
INR: 1.78 — ABNORMAL HIGH (ref 0.00–1.49)
Prothrombin Time: 20.1 seconds — ABNORMAL HIGH (ref 11.6–15.2)

## 2013-01-13 NOTE — H&P (Signed)
Jacob Campbell, MD   Jacob Code, PA-C [address] [place], [place]  [address]                             [phone]   Jacob [name] MRN:  [id number] DOB/SEX:  [birth year]/[name]  ORTHOPAEDIC HISTORY & PHYSICAL  CHIEF COMPLAINT:  Painful left Hip  HISTORY: Jacob [name] a 67 y.o. [name]  Who has a history of pain and functional disability in the left hip(s) due to arthritis and patient has failed non-surgical conservative treatments for greater than 12 weeks to include NSAID's and/or analgesics and activity modification.  Onset of symptoms was gradual starting 1 years ago with gradually worsening course since that time.The patient noted no past surgery on the left hip(s).  Patient currently rates pain in the left hip at 7 out of 10 with activity. Patient has night pain, worsening of pain with activity and weight bearing, trendelenberg gait, pain that interfers with activities of daily living and pain with passive range of motion. Patient has evidence of subchondral cysts, subchondral sclerosis, periarticular osteophytes and joint space narrowing by imaging studies. This condition presents safety issues increasing the risk of falls.   There is no current active infection.  PAST MEDICAL HISTORY: There are no active problems to display for this patient.  Past Medical History  Diagnosis Date  . Coronary artery disease   . Hypertension   . Factor 5 Leiden mutation, heterozygous   . DVT (deep venous thrombosis)   . Hemorrhoid   . Arthritis   . Cancer     . Skin cancer - basal cell. Forehead.  . Aortic aneurysm     be monitored   Past Surgical History  Procedure Laterality Date  . Prostate surgery    . Cardiac catheterization  2000  . Colonscopy       Polyps   . Skin cancer excision    . Coronary artery bypass graft  2000    2 vesssels  . Back surgery      Laminectomy fusion- --L 4/5     MEDICATIONS PRIOR TO ADMISSION: Prior to Admission medications     Medication Sig Start Date End Date Taking? Authorizing Provider  acetaminophen (TYLENOL) 500 MG tablet Take 500 mg by mouth 2 (two) times daily as needed for pain.   Yes Historical Provider, MD  Calcium-Magnesium-Vitamin D (CALCIUM 500 PO) Take 2 tablets by mouth 2 (two) times daily.   Yes Historical Provider, MD  celecoxib (CELEBREX) 200 MG capsule Take 200 mg by mouth 2 (two) times daily.   Yes Historical Provider, MD  Cholecalciferol (VITAMIN D3) 5000 UNITS TABS Take 1-2 tablets by mouth See admin instructions. Alternates 5000 to 10000 units every other day. Takes 5000 units every day then every other day takes 46962 units   Yes Historical Provider, MD  dutasteride (AVODART) 0.5 MG capsule Take 0.5 mg by mouth daily.   Yes Historical Provider, MD  folic acid (FOLVITE) 1 MG tablet Take 1 mg by mouth daily.   Yes Historical Provider, MD  magnesium gluconate (MAGONATE) 500 MG tablet Take 500 mg by mouth 2 (two) times daily.   Yes Historical Provider, MD  MELATONIN PO Take 1 tablet by mouth at bedtime as needed (sleep).   Yes Historical Provider, MD  metFORMIN (GLUCOPHAGE-XR) 750 MG 24 hr tablet Take 750 mg by mouth daily with breakfast.   Yes Historical Provider, MD  nebivolol (BYSTOLIC) 5 MG tablet Take 5 mg by mouth 2 (two) times daily.   Yes Historical Provider, MD  ramipril (ALTACE) 2.5 MG capsule Take 2.5 mg by mouth daily.   Yes Historical Provider, MD  rosuvastatin (CRESTOR) 40 MG tablet Take 40 mg by mouth daily.   Yes Historical Provider, MD  warfarin (COUMADIN) 5 MG tablet Take 2.5-5 mg by mouth See admin instructions. Takes 7.5 mg (1.5 tab)  on Monday, Wednesday and Friday and takes 5 mg (1 tab)on Sunday, Tuesday, Thursday, and saturday   Yes Historical Provider, MD     ALLERGIES:   Allergies  Allergen Reactions  . Percocet (Oxycodone-Acetaminophen) Other (See Comments)    Paranoid    REVIEW OF SYSTEMS: See past medical history  FAMILY HISTORY:  No family history on  file.  SOCIAL HISTORY:   History  Substance Use Topics  . Smoking status: Former Smoker -- 15 years  . Smokeless tobacco: Not on file     Comment: quit 25 years ago  . Alcohol Use: No     EXAMINATION:  Vital signs in last 24 hours: Temp:  [97.8 F (36.6 C)] 97.8 F (36.6 C) (02/25 0812) Pulse Rate:  [70] 70 (02/25 0812) Resp:  [20] 20 (02/25 0812) BP: (151)/(84) 151/84 mmHg (02/25 0812) SpO2:  [99 %] 99 % (02/25 0812)  Head is normocephalic.   Eyes:  Pupils equal, round and reactive to light and accommodation.  Extraocular intact. ENT: Ears, nose, and throat were benign.   Neck: supple, no bruits were noted.   Chest: good expansion.   Lungs: essentially clear.   Cardiac: regular rhythm and rate, normal S1, S2.  No murmurs appreciated. Pulses :  1+ bilateral and symmetric in lower extremities. Abdomen is scaphoid, soft, nontender, no masses palpable, normal bowel sounds                  present. CNS:  He is oriented x3 and cranial nerves II-XII grossly intact. Breast, rectal, and genital exams: not performed and not indicated for an orthopedic evaluation. Musculoskeletal: ROM IR 10 degrees,  ER 10 degrees. Positive log roll. Pain with ROM   Imaging Review Plain radiographs demonstrate severe degenerative joint disease of the left hip. The bone quality appears to be good for age and reported activity level.  Assessment: End stage arthritis, left Hip  Past Medical History  Diagnosis Date  . Coronary artery disease   . Hypertension   . Factor 5 Leiden mutation, heterozygous   . DVT (deep venous thrombosis)   . Hemorrhoid   . Arthritis   . Cancer     . Skin cancer - basal cell. Forehead.  . Aortic aneurysm     be monitored    Plan: for left total hip replacement.  The patient history, physical examination, clinical judgement of the provider and imaging studies are consistent with end stage degenerative joint disease of the left hip(s) and total hip arthroplasty is  deemed medically necessary. The treatment options including medical management, injection therapy, arthroscopy and arthroplasty were discussed at length. The risks and benefits of total hip arthroplasty were presented and reviewed. The risks due to aseptic loosening, infection, stiffness, dislocation/subluxation,  thromboembolic complications and other imponderables were discussed.  The patient acknowledged the explanation, agreed to proceed with the plan. The clearance notes recently received were reviewed and concurs with proceeding then surgical intervention.  Patient is being admitted for inpatient treatment for surgery, pain control, PT, OT, prophylactic antibiotics, VTE  prophylaxis, progressive ambulation and ADL's and discharge planning.The patient is planning to be discharged home with home health services   [facility] 01/18/2013, 9:41 AM

## 2013-01-13 NOTE — Pre-Procedure Instructions (Signed)
IAAN OREGEL  01/13/2013   Your procedure is scheduled on:  Tuesday, February 25th.  Report to [facility] at 8:10AM.  Call this number if you have problems the morning of surgery: [phone]   Remember:   Do not eat food or drink liquids after midnight.   Take these medicines the morning of surgery with A SIP OF WATER: Avodart, Nebivolol (Bystolic).  Stop taking Aspirin, Coumadin, Plavix, Effient and Herbal medications.  Do not take any NSAIDs  (Celebrex) ie: Ibuprofen,  Advil,Naproxen or any medication containing Aspirin.   Do not wear jewelry, make-up or nail polish.  Do not wear lotions, powders, or perfumes. You may wear deodorant.             Men may shave face and neck.  Do not bring valuables to the hospital.  Contacts, dentures or bridgework may not be worn into surgery.  Leave suitcase in the car. After surgery it may be brought to your room.  For patients admitted to the hospital, checkout time is 11:00 AM the day of discharge.    Special Instructions: Shower using CHG 2 nights before surgery and the night before surgery.  If you shower the day of surgery use CHG.  Use special wash - you have one bottle of CHG for all showers.  You should use approximately 1/3 of the bottle for each shower.    Please read over the following fact sheets that you were given: Pain Booklet, Coughing and Deep Breathing, Blood Transfusion Information and Surgical Site Infection Prevention

## 2013-01-13 NOTE — Progress Notes (Signed)
01/13/13 1608  OBSTRUCTIVE SLEEP APNEA  Have you ever been diagnosed with sleep apnea through a sleep study? No  Do you snore loudly (loud enough to be heard through closed doors)?  1  Do you often feel tired, fatigued, or sleepy during the daytime? 0  Has anyone observed you stop breathing during your sleep? 0  Do you have, or are you being treated for high blood pressure? 1  BMI more than 35 kg/m2? 0  Age over 69 years old? 1  Neck circumference greater than 40 cm/18 inches? 0  Gender: 1  Obstructive Sleep Apnea Score 4

## 2013-01-13 NOTE — Progress Notes (Addendum)
Medical clearance from Dr. Kevan Ny and cardiology history from Dr Mendel Ryder on chart.  Pt thinks he had a chest X- Ray at Dr Kevan Ny' office. I faxed a request to Dr Kevan Ny office.  I called Dr Rexene Edison Smith's office and requested EKG, Echo and Stress test.

## 2013-01-17 MED ORDER — CEFAZOLIN SODIUM-DEXTROSE 2-3 GM-% IV SOLR
2.0000 g | INTRAVENOUS | Status: AC
  Administered 2013-01-18: 2 g via INTRAVENOUS
  Filled 2013-01-17: qty 50

## 2013-01-18 ENCOUNTER — Inpatient Hospital Stay (HOSPITAL_COMMUNITY)
Admission: RE | Admit: 2013-01-18 | Discharge: 2013-01-20 | DRG: 470 | Disposition: A | Discharge status: Home Health | Payer: Commercial Managed Care - PPO | Source: Ambulatory Visit | Attending: Orthopaedic Surgery | Admitting: Orthopaedic Surgery

## 2013-01-18 ENCOUNTER — Ambulatory Visit (HOSPITAL_COMMUNITY): Payer: Commercial Managed Care - PPO | Admitting: Anesthesiology

## 2013-01-18 ENCOUNTER — Ambulatory Visit (HOSPITAL_COMMUNITY): Payer: Commercial Managed Care - PPO

## 2013-01-18 ENCOUNTER — Encounter (HOSPITAL_COMMUNITY): Payer: Self-pay | Admitting: Anesthesiology

## 2013-01-18 ENCOUNTER — Inpatient Hospital Stay (HOSPITAL_COMMUNITY): Payer: Commercial Managed Care - PPO

## 2013-01-18 ENCOUNTER — Encounter (HOSPITAL_COMMUNITY)
Admission: RE | Disposition: A | Discharge status: Home Health | Payer: Self-pay | Source: Ambulatory Visit | Attending: Orthopaedic Surgery

## 2013-01-18 DIAGNOSIS — D6859 Other primary thrombophilia: Secondary | ICD-10-CM | POA: Diagnosis present

## 2013-01-18 DIAGNOSIS — I4891 Unspecified atrial fibrillation: Secondary | ICD-10-CM

## 2013-01-18 DIAGNOSIS — I10 Essential (primary) hypertension: Secondary | ICD-10-CM | POA: Diagnosis present

## 2013-01-18 DIAGNOSIS — M1612 Unilateral primary osteoarthritis, left hip: Secondary | ICD-10-CM | POA: Diagnosis present

## 2013-01-18 DIAGNOSIS — D6851 Activated protein C resistance: Secondary | ICD-10-CM | POA: Diagnosis present

## 2013-01-18 DIAGNOSIS — R55 Syncope and collapse: Secondary | ICD-10-CM | POA: Diagnosis not present

## 2013-01-18 DIAGNOSIS — I714 Abdominal aortic aneurysm, without rupture, unspecified: Secondary | ICD-10-CM | POA: Diagnosis present

## 2013-01-18 DIAGNOSIS — M1710 Unilateral primary osteoarthritis, unspecified knee: Principal | ICD-10-CM | POA: Diagnosis present

## 2013-01-18 DIAGNOSIS — Z7901 Long term (current) use of anticoagulants: Secondary | ICD-10-CM

## 2013-01-18 DIAGNOSIS — Z85828 Personal history of other malignant neoplasm of skin: Secondary | ICD-10-CM

## 2013-01-18 DIAGNOSIS — I2510 Atherosclerotic heart disease of native coronary artery without angina pectoris: Secondary | ICD-10-CM | POA: Diagnosis present

## 2013-01-18 DIAGNOSIS — Z86718 Personal history of other venous thrombosis and embolism: Secondary | ICD-10-CM

## 2013-01-18 HISTORY — PX: TOTAL HIP ARTHROPLASTY: SHX124

## 2013-01-18 LAB — URINALYSIS, ROUTINE W REFLEX MICROSCOPIC
Bilirubin Urine: NEGATIVE
Glucose, UA: NEGATIVE mg/dL
Hgb urine dipstick: NEGATIVE
Specific Gravity, Urine: 1.01 (ref 1.005–1.030)
Urobilinogen, UA: 0.2 mg/dL (ref 0.0–1.0)
pH: 7 (ref 5.0–8.0)

## 2013-01-18 LAB — HEMOGLOBIN AND HEMATOCRIT, BLOOD: Hemoglobin: 10.4 g/dL — ABNORMAL LOW (ref 13.0–17.0)

## 2013-01-18 LAB — CREATININE, SERUM
Creatinine, Ser: 0.78 mg/dL (ref 0.50–1.35)
GFR calc non Af Amer: >90 mL/min (ref >90)

## 2013-01-18 LAB — CBC
MCHC: 33.8 g/dL (ref 30.0–36.0)
RDW: 14.1 % (ref 11.5–15.5)

## 2013-01-18 SURGERY — ARTHROPLASTY, HIP, TOTAL,POSTERIOR APPROACH
Anesthesia: General | Site: Hip | Laterality: Left | Wound class: Clean

## 2013-01-18 MED ORDER — MENTHOL 3 MG MT LOZG: 1.0000 | LOZENGE | OROMUCOSAL | Status: DC | PRN

## 2013-01-18 MED ORDER — DEXTROSE 5 % IV SOLN
INTRAVENOUS | Status: DC | PRN
  Administered 2013-01-18: 10:00:00 via INTRAVENOUS

## 2013-01-18 MED ORDER — RAMIPRIL 2.5 MG PO CAPS
2.5000 mg | ORAL_CAPSULE | Freq: Every day | ORAL | Status: DC
  Administered 2013-01-18 – 2013-01-20 (×2): 2.5 mg via ORAL
  Filled 2013-01-18 (×3): qty 1

## 2013-01-18 MED ORDER — DOCUSATE SODIUM 100 MG PO CAPS
100.0000 mg | ORAL_CAPSULE | Freq: Two times a day (BID) | ORAL | Status: DC
  Administered 2013-01-18 – 2013-01-20 (×4): 100 mg via ORAL
  Filled 2013-01-18 (×5): qty 1

## 2013-01-18 MED ORDER — FLEET ENEMA 7-19 GM/118ML RE ENEM: 1.0000 | ENEMA | Freq: Once | RECTAL | Status: AC | PRN

## 2013-01-18 MED ORDER — PHENOL 1.4 % MT LIQD
1.0000 | OROMUCOSAL | Status: DC | PRN
  Administered 2013-01-18: 1 via OROMUCOSAL
  Filled 2013-01-18: qty 177

## 2013-01-18 MED ORDER — ROCURONIUM BROMIDE 100 MG/10ML IV SOLN
INTRAVENOUS | Status: DC | PRN
  Administered 2013-01-18: 50 mg via INTRAVENOUS

## 2013-01-18 MED ORDER — HYDROMORPHONE HCL PF 1 MG/ML IJ SOLN
0.2500 mg | INTRAMUSCULAR | Status: DC | PRN
  Administered 2013-01-18 (×4): 0.5 mg via INTRAVENOUS

## 2013-01-18 MED ORDER — HYDROMORPHONE HCL 2 MG PO TABS
1.0000 mg | ORAL_TABLET | ORAL | Status: DC | PRN
  Administered 2013-01-18: 1 mg via ORAL
  Administered 2013-01-19 – 2013-01-20 (×5): 2 mg via ORAL
  Filled 2013-01-18: qty 1
  Filled 2013-01-18: qty 2
  Filled 2013-01-18 (×4): qty 1

## 2013-01-18 MED ORDER — FENTANYL CITRATE 0.05 MG/ML IJ SOLN
INTRAMUSCULAR | Status: DC | PRN
  Administered 2013-01-18 (×2): 50 ug via INTRAVENOUS
  Administered 2013-01-18: 100 ug via INTRAVENOUS

## 2013-01-18 MED ORDER — SODIUM CHLORIDE 0.9 % IR SOLN
Status: DC | PRN
  Administered 2013-01-18: 1000 mL

## 2013-01-18 MED ORDER — BUPIVACAINE-EPINEPHRINE 0.25% -1:200000 IJ SOLN
50.0000 mL | Freq: Once | INTRAMUSCULAR | Status: DC
Start: 2013-01-18 — End: 2013-01-18
  Filled 2013-01-18: qty 50

## 2013-01-18 MED ORDER — LIDOCAINE HCL (CARDIAC) 20 MG/ML IV SOLN
INTRAVENOUS | Status: DC | PRN
  Administered 2013-01-18: 100 mg via INTRAVENOUS

## 2013-01-18 MED ORDER — OXYCODONE HCL 5 MG PO TABS: 5.0000 mg | ORAL_TABLET | Freq: Once | ORAL | Status: DC | PRN

## 2013-01-18 MED ORDER — WARFARIN SODIUM 7.5 MG PO TABS
7.5000 mg | ORAL_TABLET | ORAL | Status: AC
  Administered 2013-01-18: 7.5 mg via ORAL
  Filled 2013-01-18: qty 1

## 2013-01-18 MED ORDER — ONDANSETRON HCL 4 MG PO TABS: 4.0000 mg | ORAL_TABLET | Freq: Four times a day (QID) | ORAL | Status: DC | PRN

## 2013-01-18 MED ORDER — METOCLOPRAMIDE HCL 5 MG/ML IJ SOLN: 5.0000 mg | Freq: Three times a day (TID) | INTRAMUSCULAR | Status: DC | PRN

## 2013-01-18 MED ORDER — DEXTROSE 5 % IV SOLN
500.0000 mg | Freq: Four times a day (QID) | INTRAVENOUS | Status: DC | PRN
  Administered 2013-01-18: 500 mg via INTRAVENOUS
  Filled 2013-01-18: qty 5

## 2013-01-18 MED ORDER — BUPIVACAINE-EPINEPHRINE PF 0.25-1:200000 % IJ SOLN
INTRAMUSCULAR | Status: DC | PRN
  Administered 2013-01-18: 25 mL

## 2013-01-18 MED ORDER — PHENYLEPHRINE HCL 10 MG/ML IJ SOLN
INTRAMUSCULAR | Status: DC | PRN
  Administered 2013-01-18 (×2): 80 ug via INTRAVENOUS

## 2013-01-18 MED ORDER — INSULIN ASPART 100 UNIT/ML ~~LOC~~ SOLN: 0.0000 [IU] | Freq: Every day | SUBCUTANEOUS | Status: DC

## 2013-01-18 MED ORDER — ATORVASTATIN CALCIUM 80 MG PO TABS
80.0000 mg | ORAL_TABLET | Freq: Every day | ORAL | Status: DC
  Administered 2013-01-18 – 2013-01-19 (×2): 80 mg via ORAL
  Filled 2013-01-18 (×3): qty 1

## 2013-01-18 MED ORDER — HYDROMORPHONE HCL PF 1 MG/ML IJ SOLN
0.5000 mg | INTRAMUSCULAR | Status: DC | PRN
  Administered 2013-01-18: 0.5 mg via INTRAVENOUS
  Filled 2013-01-18 (×2): qty 1

## 2013-01-18 MED ORDER — CEFAZOLIN SODIUM-DEXTROSE 2-3 GM-% IV SOLR
2.0000 g | Freq: Four times a day (QID) | INTRAVENOUS | Status: AC
  Administered 2013-01-18 (×2): 2 g via INTRAVENOUS
  Filled 2013-01-18 (×4): qty 50

## 2013-01-18 MED ORDER — PROPOFOL 10 MG/ML IV BOLUS
INTRAVENOUS | Status: DC | PRN
  Administered 2013-01-18: 100 mg via INTRAVENOUS

## 2013-01-18 MED ORDER — METOCLOPRAMIDE HCL 10 MG PO TABS: 5.0000 mg | ORAL_TABLET | Freq: Three times a day (TID) | ORAL | Status: DC | PRN

## 2013-01-18 MED ORDER — HYDROMORPHONE HCL PF 1 MG/ML IJ SOLN
INTRAMUSCULAR | Status: AC
  Filled 2013-01-18: qty 1

## 2013-01-18 MED ORDER — SODIUM CHLORIDE 0.9 % IV SOLN: INTRAVENOUS | Status: DC

## 2013-01-18 MED ORDER — INSULIN ASPART 100 UNIT/ML ~~LOC~~ SOLN
0.0000 [IU] | Freq: Three times a day (TID) | SUBCUTANEOUS | Status: DC
Start: 2013-01-18 — End: 2013-01-18

## 2013-01-18 MED ORDER — ACETAMINOPHEN 10 MG/ML IV SOLN
1000.0000 mg | Freq: Four times a day (QID) | INTRAVENOUS | Status: AC
  Administered 2013-01-18 – 2013-01-19 (×3): 1000 mg via INTRAVENOUS
  Filled 2013-01-18 (×6): qty 100

## 2013-01-18 MED ORDER — ONDANSETRON HCL 4 MG/2ML IJ SOLN: 4.0000 mg | Freq: Four times a day (QID) | INTRAMUSCULAR | Status: DC | PRN

## 2013-01-18 MED ORDER — WARFARIN - PHARMACIST DOSING INPATIENT: Freq: Every day | Status: DC

## 2013-01-18 MED ORDER — LACTATED RINGERS IV SOLN
INTRAVENOUS | Status: DC
  Administered 2013-01-18: 10:00:00 via INTRAVENOUS

## 2013-01-18 MED ORDER — OXYCODONE HCL 5 MG/5ML PO SOLN: 5.0000 mg | Freq: Once | ORAL | Status: DC | PRN

## 2013-01-18 MED ORDER — MIDAZOLAM HCL 5 MG/5ML IJ SOLN
INTRAMUSCULAR | Status: DC | PRN
  Administered 2013-01-18 (×2): 1 mg via INTRAVENOUS

## 2013-01-18 MED ORDER — ALUM & MAG HYDROXIDE-SIMETH 200-200-20 MG/5ML PO SUSP: 30.0000 mL | ORAL | Status: DC | PRN

## 2013-01-18 MED ORDER — MAGNESIUM GLUCONATE 500 MG PO TABS: 500.0000 mg | ORAL_TABLET | Freq: Two times a day (BID) | ORAL | Status: DC

## 2013-01-18 MED ORDER — CHLORHEXIDINE GLUCONATE 4 % EX LIQD: 60.0000 mL | Freq: Once | CUTANEOUS | Status: DC

## 2013-01-18 MED ORDER — ARTIFICIAL TEARS OP OINT
TOPICAL_OINTMENT | OPHTHALMIC | Status: DC | PRN
  Administered 2013-01-18: 1 via OPHTHALMIC

## 2013-01-18 MED ORDER — NEBIVOLOL HCL 5 MG PO TABS
5.0000 mg | ORAL_TABLET | Freq: Two times a day (BID) | ORAL | Status: DC
  Administered 2013-01-18 – 2013-01-20 (×3): 5 mg via ORAL
  Filled 2013-01-18 (×5): qty 1

## 2013-01-18 MED ORDER — ENOXAPARIN SODIUM 30 MG/0.3ML ~~LOC~~ SOLN
30.0000 mg | Freq: Two times a day (BID) | SUBCUTANEOUS | Status: DC
  Administered 2013-01-19 – 2013-01-20 (×3): 30 mg via SUBCUTANEOUS
  Filled 2013-01-18 (×5): qty 0.3

## 2013-01-18 MED ORDER — MAGNESIUM HYDROXIDE 400 MG/5ML PO SUSP: 30.0000 mL | Freq: Every day | ORAL | Status: DC | PRN

## 2013-01-18 MED ORDER — ONDANSETRON HCL 4 MG/2ML IJ SOLN: 4.0000 mg | Freq: Once | INTRAMUSCULAR | Status: DC | PRN

## 2013-01-18 MED ORDER — NEOSTIGMINE METHYLSULFATE 1 MG/ML IJ SOLN
INTRAMUSCULAR | Status: DC | PRN
  Administered 2013-01-18: 5 mg via INTRAVENOUS

## 2013-01-18 MED ORDER — MEPERIDINE HCL 25 MG/ML IJ SOLN: 6.2500 mg | INTRAMUSCULAR | Status: DC | PRN

## 2013-01-18 MED ORDER — LACTATED RINGERS IV SOLN
INTRAVENOUS | Status: DC | PRN
  Administered 2013-01-18 (×3): via INTRAVENOUS

## 2013-01-18 MED ORDER — ENOXAPARIN SODIUM 30 MG/0.3ML ~~LOC~~ SOLN
30.0000 mg | Freq: Once | SUBCUTANEOUS | Status: AC
  Administered 2013-01-19: 30 mg via SUBCUTANEOUS
  Filled 2013-01-18: qty 0.3

## 2013-01-18 MED ORDER — BISACODYL 10 MG RE SUPP: 10.0000 mg | Freq: Every day | RECTAL | Status: DC | PRN

## 2013-01-18 MED ORDER — FOLIC ACID 1 MG PO TABS
1.0000 mg | ORAL_TABLET | Freq: Every day | ORAL | Status: DC
  Administered 2013-01-18 – 2013-01-20 (×3): 1 mg via ORAL
  Filled 2013-01-18 (×3): qty 1

## 2013-01-18 MED ORDER — CHLORHEXIDINE GLUCONATE 4 % EX LIQD: 60.0000 mL | Freq: Every day | CUTANEOUS | Status: DC

## 2013-01-18 MED ORDER — METHOCARBAMOL 500 MG PO TABS
500.0000 mg | ORAL_TABLET | Freq: Four times a day (QID) | ORAL | Status: DC | PRN
  Administered 2013-01-19 – 2013-01-20 (×4): 500 mg via ORAL
  Filled 2013-01-18 (×5): qty 1

## 2013-01-18 MED ORDER — GLYCOPYRROLATE 0.2 MG/ML IJ SOLN
INTRAMUSCULAR | Status: DC | PRN
  Administered 2013-01-18: .8 mg via INTRAVENOUS

## 2013-01-18 MED ORDER — ONDANSETRON HCL 4 MG/2ML IJ SOLN
INTRAMUSCULAR | Status: DC | PRN
  Administered 2013-01-18: 4 mg via INTRAVENOUS

## 2013-01-18 MED ORDER — KETOROLAC TROMETHAMINE 15 MG/ML IJ SOLN: 7.5000 mg | Freq: Four times a day (QID) | INTRAMUSCULAR | Status: DC

## 2013-01-18 MED ORDER — DUTASTERIDE 0.5 MG PO CAPS
0.5000 mg | ORAL_CAPSULE | Freq: Every day | ORAL | Status: DC
  Administered 2013-01-19 – 2013-01-20 (×2): 0.5 mg via ORAL
  Filled 2013-01-18 (×3): qty 1

## 2013-01-18 SURGICAL SUPPLY — 62 items
BLADE SAW SAG 73X25 THK (BLADE) ×1
BLADE SAW SGTL 73X25 THK (BLADE) ×1 IMPLANT
BRUSH FEMORAL CANAL (MISCELLANEOUS) IMPLANT
CATH FOLEY 2WAY SLVR  5CC 12FR (CATHETERS) ×1
CATH FOLEY 2WAY SLVR 5CC 12FR (CATHETERS) ×1 IMPLANT
CLOTH BEACON ORANGE TIMEOUT ST (SAFETY) ×2 IMPLANT
COVER BACK TABLE 24X17X13 BIG (DRAPES) IMPLANT
COVER SURGICAL LIGHT HANDLE (MISCELLANEOUS) ×2 IMPLANT
DRAPE INCISE IOBAN 66X45 STRL (DRAPES) ×2 IMPLANT
DRAPE ORTHO SPLIT 77X108 STRL (DRAPES) ×2
DRAPE SURG ORHT 6 SPLT 77X108 (DRAPES) ×2 IMPLANT
DRSG MEPILEX BORDER 4X12 (GAUZE/BANDAGES/DRESSINGS) ×2 IMPLANT
DURAPREP 26ML APPLICATOR (WOUND CARE) ×4 IMPLANT
ELECT BLADE 6.5 EXT (BLADE) IMPLANT
ELECT REM PT RETURN 9FT ADLT (ELECTROSURGICAL) ×2
ELECTRODE REM PT RTRN 9FT ADLT (ELECTROSURGICAL) ×1 IMPLANT
EVACUATOR 1/8 PVC DRAIN (DRAIN) IMPLANT
FACESHIELD LNG OPTICON STERILE (SAFETY) ×4 IMPLANT
GLOVE BIOGEL PI IND STRL 7.0 (GLOVE) ×1 IMPLANT
GLOVE BIOGEL PI IND STRL 8 (GLOVE) ×2 IMPLANT
GLOVE BIOGEL PI IND STRL 8.5 (GLOVE) ×1 IMPLANT
GLOVE BIOGEL PI INDICATOR 7.0 (GLOVE) ×1
GLOVE BIOGEL PI INDICATOR 8 (GLOVE) ×2
GLOVE BIOGEL PI INDICATOR 8.5 (GLOVE) ×1
GLOVE ECLIPSE 8.0 STRL XLNG CF (GLOVE) ×6 IMPLANT
GLOVE SURG ORTHO 8.5 STRL (GLOVE) ×6 IMPLANT
GLOVE SURG SS PI 6.5 STRL IVOR (GLOVE) ×2 IMPLANT
GOWN PREVENTION PLUS XLARGE (GOWN DISPOSABLE) ×2 IMPLANT
GOWN PREVENTION PLUS XXLARGE (GOWN DISPOSABLE) ×2 IMPLANT
GOWN STRL NON-REIN LRG LVL3 (GOWN DISPOSABLE) ×2 IMPLANT
HANDPIECE INTERPULSE COAX TIP (DISPOSABLE)
IMMOBILIZER KNEE 20 (SOFTGOODS)
IMMOBILIZER KNEE 20 THIGH 36 (SOFTGOODS) IMPLANT
IMMOBILIZER KNEE 22 UNIV (SOFTGOODS) IMPLANT
IMMOBILIZER KNEE 24 THIGH 36 (MISCELLANEOUS) IMPLANT
IMMOBILIZER KNEE 24 UNIV (MISCELLANEOUS)
KIT BASIN OR (CUSTOM PROCEDURE TRAY) ×2 IMPLANT
KIT ROOM TURNOVER OR (KITS) ×2 IMPLANT
MANIFOLD NEPTUNE II (INSTRUMENTS) ×2 IMPLANT
NEEDLE 22X1 1/2 (OR ONLY) (NEEDLE) ×2 IMPLANT
NS IRRIG 1000ML POUR BTL (IV SOLUTION) ×2 IMPLANT
PACK TOTAL JOINT (CUSTOM PROCEDURE TRAY) ×2 IMPLANT
PAD ARMBOARD 7.5X6 YLW CONV (MISCELLANEOUS) ×4 IMPLANT
PRESSURIZER FEMORAL UNIV (MISCELLANEOUS) IMPLANT
SET HNDPC FAN SPRY TIP SCT (DISPOSABLE) IMPLANT
STAPLER VISISTAT 35W (STAPLE) ×2 IMPLANT
SUCTION FRAZIER TIP 10 FR DISP (SUCTIONS) ×2 IMPLANT
SUT BONE WAX W31G (SUTURE) IMPLANT
SUT ETHIBOND NAB CT1 #1 30IN (SUTURE) ×4 IMPLANT
SUT MNCRL AB 3-0 PS2 18 (SUTURE) ×2 IMPLANT
SUT VIC AB 0 CT1 27 (SUTURE) ×2
SUT VIC AB 0 CT1 27XBRD ANBCTR (SUTURE) ×2 IMPLANT
SUT VIC AB 1 CT1 27 (SUTURE) ×2
SUT VIC AB 1 CT1 27XBRD ANBCTR (SUTURE) ×2 IMPLANT
SUT VIC AB 2-0 CT1 27 (SUTURE) ×1
SUT VIC AB 2-0 CT1 TAPERPNT 27 (SUTURE) ×1 IMPLANT
SYR CONTROL 10ML LL (SYRINGE) ×2 IMPLANT
TOWEL OR 17X24 6PK STRL BLUE (TOWEL DISPOSABLE) ×2 IMPLANT
TOWEL OR 17X26 10 PK STRL BLUE (TOWEL DISPOSABLE) ×2 IMPLANT
TOWER CARTRIDGE SMART MIX (DISPOSABLE) IMPLANT
TRAY FOLEY CATH 14FR (SET/KITS/TRAYS/PACK) ×2 IMPLANT
WATER STERILE IRR 1000ML POUR (IV SOLUTION) ×4 IMPLANT

## 2013-01-18 NOTE — Anesthesia Preprocedure Evaluation (Addendum)
Anesthesia Evaluation  Patient identified by MRN, date of birth, ID band Patient awake    Reviewed: Allergy & Precautions, H&P , NPO status , Patient's Chart, lab work & pertinent test results, reviewed documented beta blocker date and time   Airway Mallampati: I TM Distance: >3 FB Neck ROM: Full    Dental  (+) Teeth Intact and Dental Advisory Given   Pulmonary asthma , COPD Childhood asthma.  No current problem.         Cardiovascular hypertension, Pt. on medications and Pt. on home beta blockers + CAD  CABG 2000   Neuro/Psych    GI/Hepatic   Endo/Other    Renal/GU      Musculoskeletal   Abdominal   Peds  Hematology Factor 5 Leiden  deficiency   Anesthesia Other Findings   Reproductive/Obstetrics                         Anesthesia Physical Anesthesia Plan  ASA: III  Anesthesia Plan: General   Post-op Pain Management:    Induction: Intravenous  Airway Management Planned: Oral ETT  Additional Equipment:   Intra-op Plan:   Post-operative Plan: Extubation in OR  Informed Consent: I have reviewed the patients History and Physical, chart, labs and discussed the procedure including the risks, benefits and alternatives for the proposed anesthesia with the patient or authorized representative who has indicated his/her understanding and acceptance.     Plan Discussed with: CRNA and Surgeon  Anesthesia Plan Comments:         Anesthesia Quick Evaluation

## 2013-01-18 NOTE — Progress Notes (Signed)
PHARMACIST - PHYSICIAN ORDER COMMUNICATION  CONCERNING: P&T Medication Policy on Herbal Medications  DESCRIPTION:  This patient's order for:  Magnesium gluconate  has been noted.  This product(s) is classified as an "herbal" or natural product.  It contains a very low dose of Magnesium that is not available in a formulary alternative.  Due to a lack of definitive safety studies or FDA approval, nonstandard manufacturing practices, plus the potential risk of unknown drug-drug interactions while on inpatient medications, the Pharmacy and Therapeutics Committee does not permit the use of "herbal" or natural products of this type within [facility].   ACTION TAKEN: The pharmacy department is unable to verify this order at this time and your patient has been informed of this safety policy. Please reevaluate patient's clinical condition at discharge and address if the herbal or natural product(s) should be resumed at that time.

## 2013-01-18 NOTE — Anesthesia Procedure Notes (Signed)
Procedure Name: Intubation Date/Time: 01/18/2013 10:15 AM Performed by: Darcey Nora B Pre-anesthesia Checklist: Patient identified, Emergency Drugs available, Suction available and Patient being monitored Patient Re-evaluated:Patient Re-evaluated prior to inductionOxygen Delivery Method: Circle system utilized Preoxygenation: Pre-oxygenation with 100% oxygen Intubation Type: IV induction Ventilation: Mask ventilation without difficulty Laryngoscope Size: Mac and 4 Grade View: Grade I Tube size: 7.5 mm Number of attempts: 1 Airway Equipment and Method: Stylet Placement Confirmation: ETT inserted through vocal cords under direct vision,  breath sounds checked- equal and bilateral and positive ETCO2 Secured at: 23 (cm at teeth) cm Tube secured with: Tape Dental Injury: Teeth and Oropharynx as per pre-operative assessment

## 2013-01-18 NOTE — Progress Notes (Signed)
Patient called RN to beside for pain medication. When RN arrived to room patient was nonverbal, diaphoretic and bradycardic. Vital signs are 92/49(55), HR 58, Temp 98.3 Resp 12 and CBG 152. Dr. August Saucer notified. 250 NS bolus ordered and given, HBG and EKG ordered. Will continue to monitor.

## 2013-01-18 NOTE — Preoperative (Signed)
Beta Blockers   Reason not to administer Beta Blockers:Bystolic 0715

## 2013-01-18 NOTE — Progress Notes (Signed)
Patient reports no history of diabetes. Patient takes Metformin at home for prostate cancer regimen. Dr. August Saucer notified. CBG's and insulin orders discontinued.

## 2013-01-18 NOTE — Op Note (Signed)
PATIENT ID:      Jacob [name]  MRN:     [id number] DOB/AGE:    01-14-1946 / 67 y.o.       OPERATIVE REPORT    DATE OF PROCEDURE:  01/18/2013       PREOPERATIVE DIAGNOSIS:   Left Hip Osteoarthrosis                                                       There is no weight on file to calculate BMI.     POSTOPERATIVE DIAGNOSIS:   Left Hip Osteoarthrosis                                                                     There is no weight on file to calculate BMI.     PROCEDURE:  Procedure(s): TOTAL HIP ARTHROPLASTY left     SURGEON:  [name], MD    ASSISTANT:   [name], PA-C   (Present and scrubbed throughout the case, critical for assistance with exposure, retraction, instrumentation, and closure.)          ANESTHESIA: general     DRAINS: none :      TOURNIQUET TIME: * No tourniquets in log *    COMPLICATIONS:  None   CONDITION:  stable  PROCEDURE IN [id number]   [name] W 01/18/2013, 12:14 PM

## 2013-01-18 NOTE — Transfer of Care (Signed)
Immediate Anesthesia Transfer of Care Note  Patient: Jacob [name]  Procedure(s) Performed: Procedure(s) with comments: TOTAL HIP ARTHROPLASTY (Left) - Left Total Hip Arthroplasty [id number] Patient Location: PACU  Anesthesia Type:General  Level of Consciousness: awake, alert , oriented and patient cooperative  Airway & Oxygen Therapy: Patient Spontanous Breathing and Patient connected to nasal cannula oxygen  Post-op Assessment: Report given to PACU RN and Post -op Vital signs reviewed and stable  Post vital signs: Reviewed and stable  Complications: No apparent anesthesia complications

## 2013-01-18 NOTE — Consult Note (Signed)
ANTICOAGULATION CONSULT NOTE - Initial Consult  Pharmacy Consult for Coumadin  Indication: factor V Leiden deficiency, history of DVT  Allergies: Allergies  Allergen Reactions  . Percocet (Oxycodone-Acetaminophen) Other (See Comments)    Paranoid    Height/Weight: Height: 6' 3.2" (191 cm) Weight: 223 lb 8.7 oz (101.4 kg) IBW/kg (Calculated) : 84.95  Vital Signs: BP 130/78  Pulse 74  Temp(Src) 99 F (37.2 C) (Oral)  Resp 18  Ht 6' 3.2" (1.91 m)  Wt 223 lb 8.7 oz (101.4 kg)  BMI 27.8 kg/m2  SpO2 100%  Active Problems: Principal Problem:   Osteoarthritis of left hip   Labs:  Recent Labs  01/18/13 1623 01/18/13 1751  HGB 11.0*  --   HCT 32.5*  --   PLT 71*  --   LABPROT  --  13.9  INR  --  1.08  CREATININE 0.78  --    Lab Results  Component Value Date   INR 1.08 01/18/2013   INR 1.78* 01/13/2013   Estimated Creatinine Clearance: 106.3 ml/min (by C-G formula based on Cr of 0.78).  Medical / Surgical History: Past Medical History  Diagnosis Date  . Coronary artery disease   . Hypertension   . Factor 5 Leiden mutation, heterozygous   . DVT (deep venous thrombosis)   . Hemorrhoid   . Arthritis   . Cancer     . Skin cancer - basal cell. Forehead.  . Aortic aneurysm     be monitored   Past Surgical History  Procedure Laterality Date  . Prostate surgery    . Cardiac catheterization  2000  . Colonscopy       Polyps   . Skin cancer excision    . Coronary artery bypass graft  2000    2 vesssels  . Back surgery      Laminectomy fusion- --L 4/5  . Total hip arthroplasty Left 01/18/2013    Dr Cleophas Dunker    Medications:  Medication Sig  . acetaminophen (TYLENOL) 500 MG tablet Take 500 mg by mouth 2 (two) times daily as needed for pain.  . Calcium-Magnesium-Vitamin D (CALCIUM 500 PO) Take 2 tablets by mouth 2 (two) times daily.  . celecoxib (CELEBREX) 200 MG capsule Take 200 mg by mouth 2 (two) times daily.  . Cholecalciferol (VITAMIN D3) 5000 UNITS  TABS Take 1-2 tablets by mouth See admin instructions. Alternates 5000 to 10000 units every other day. Takes 5000 units every day then every other day takes 62130 units  . dutasteride (AVODART) 0.5 MG capsule Take 0.5 mg by mouth daily.  . folic acid (FOLVITE) 1 MG tablet Take 1 mg by mouth daily.  . magnesium gluconate (MAGONATE) 500 MG tablet Take 500 mg by mouth 2 (two) times daily.  Marland Kitchen MELATONIN PO Take 1 tablet by mouth at bedtime as needed (sleep).  . metFORMIN (GLUCOPHAGE-XR) 750 MG 24 hr tablet Take 750 mg by mouth daily with breakfast.  . nebivolol (BYSTOLIC) 5 MG tablet Take 5 mg by mouth 2 (two) times daily.  . ramipril (ALTACE) 2.5 MG capsule Take 2.5 mg by mouth daily.  . rosuvastatin (CRESTOR) 40 MG tablet Take 40 mg by mouth daily.  Marland Kitchen warfarin (COUMADIN) 5 MG tablet Takes 7.5 mg (1.5 tab)  on Monday, Wednesday and Friday and takes  5 mg (1 tab) on Sunday, Tuesday, Thursday, and Saturday    Assessment:  66 y.o.male s/p L-THA and medical history significant for Factor 5 Leiden mutation and DVT now to be restarted  on Coumadin for VTE prophylaxis.  Home dose of Coumadin is 5 mg TTSS and 7.5 mg MWF. WBC/Hgb/Hct/Plts:  5.8/11.0/32.5/71 .  The thrombocytopenia has been noted for some time.  He was on Lovenox bridging prior to surgery without adverse effects.  Goal of Therapy:   INR 2-3    Monitor for bleeding complications, further drops in Platelet count.   Plan:   Coumadin 7.5 mg tonight. Daily INR's, CBC. Watch Platelets closely while on Lovenox bridging.  Liisa Picone, Elisha Headland,  Pharm.D.. 01/18/2013,  7:00 PM

## 2013-01-18 NOTE — Progress Notes (Signed)
Receiving report from Tammi Klippel RN

## 2013-01-18 NOTE — H&P (Signed)
  The recent History & Physical has been reviewed. I have personally examined the patient today. There is no interval change to the documented History & Physical. The patient would like to proceed with the procedure.  [name] W 01/18/2013,  9:34 AM

## 2013-01-19 LAB — TROPONIN I
Troponin I: <0.3 ng/mL (ref <0.30)
Troponin I: <0.3 ng/mL (ref <0.30)
Troponin I: <0.3 ng/mL (ref <0.30)

## 2013-01-19 LAB — URINE CULTURE
Colony Count: NO GROWTH
Culture: NO GROWTH

## 2013-01-19 LAB — CBC
MCH: 28.5 pg (ref 26.0–34.0)
MCHC: 34.3 g/dL (ref 30.0–36.0)
Platelets: 71 10*3/uL — ABNORMAL LOW (ref 150–400)

## 2013-01-19 LAB — PROTIME-INR: INR: 1.24 (ref 0.00–1.49)

## 2013-01-19 LAB — BASIC METABOLIC PANEL
Calcium: 8.7 mg/dL (ref 8.4–10.5)
GFR calc non Af Amer: >90 mL/min (ref >90)
Glucose, Bld: 132 mg/dL — ABNORMAL HIGH (ref 70–99)
Sodium: 137 mEq/L (ref 135–145)

## 2013-01-19 LAB — HEMOGLOBIN A1C
Hgb A1c MFr Bld: 5.8 % — ABNORMAL HIGH (ref <5.7)
Mean Plasma Glucose: 120 mg/dL — ABNORMAL HIGH (ref <117)

## 2013-01-19 MED ORDER — WARFARIN SODIUM 7.5 MG PO TABS
7.5000 mg | ORAL_TABLET | Freq: Once | ORAL | Status: AC
  Administered 2013-01-19: 7.5 mg via ORAL
  Filled 2013-01-19: qty 1

## 2013-01-19 NOTE — Progress Notes (Signed)
CARE MANAGEMENT NOTE 01/19/2013  Patient:  Jacob [name],Jacob [name]   Account Number:  [id number]  Date Initiated:  01/19/2013  Documentation initiated by:  Vance Peper  Subjective/Objective Assessment:   67 yr old male s/p left total hip arthroplasty.     Action/Plan:   CM spoke with patient concerning home health and DME needs. Choice offered. patient states he has [name] raised toilet so doesnt need 3in1.   Anticipated DC Date:  01/20/2013   Anticipated DC Plan:  HOME W HOME HEALTH SERVICES      DC Planning Services  CM consult      PAC Choice  DURABLE MEDICAL EQUIPMENT  HOME HEALTH   Choice offered to / List presented to:  C-1 Patient   DME arranged  [facility]      DME agency  [employer]     HH arranged  HH-1 RN  HH-2 PT      HH agency  [facility]   Status of service:  Completed, signed off Medicare Important Message given?   (If response is "NO", the following Medicare IM given date fields will be blank) Date Medicare IM given:   Date Additional Medicare IM given:    Discharge Disposition:  HOME W HOME HEALTH SERVICES  Per UR Regulation:    If discussed at Long Length of Stay Meetings, dates discussed:    Comments:

## 2013-01-19 NOTE — Consult Note (Signed)
ANTICOAGULATION CONSULT NOTE -follow up  Pharmacy Consult for Coumadin  Indication: factor V Leiden deficiency, history of DVT  Allergies: Allergies  Allergen Reactions  . Percocet (Oxycodone-Acetaminophen) Other (See Comments)    Paranoid    Height/Weight: Height: 6' 3.2" (191 cm) Weight: 223 lb 8.7 oz (101.4 kg) IBW/kg (Calculated) : 84.95  Vital Signs: BP 119/57  Pulse 92  Temp(Src) 98.8 F (37.1 C) (Oral)  Resp 18  Ht 6' 3.2" (1.91 m)  Wt 223 lb 8.7 oz (101.4 kg)  BMI 27.8 kg/m2  SpO2 96%  Active Problems: Principal Problem:   Osteoarthritis of left hip   Labs:  Recent Labs  01/18/13 1623 01/18/13 1751 01/18/13 1937 01/19/13 0624  HGB 11.0*  --  10.4* 9.9*  HCT 32.5*  --  31.2* 28.9*  PLT 71*  --   --  71*  LABPROT  --  13.9  --  15.4*  INR  --  1.08  --  1.24  CREATININE 0.78  --   --  0.64   Lab Results  Component Value Date   INR 1.24 01/19/2013   INR 1.08 01/18/2013   INR 1.78* 01/13/2013   Estimated Creatinine Clearance: 106.3 ml/min (by C-G formula based on Cr of 0.64).   Assessment:  66 y.o.male s/p L-THA and medical history significant for Factor 5 Leiden mutation and DVT restarted on Coumadin for VTE prophylaxis.  Home dose of Coumadin is 5 mg TTSS and 7.5 mg MWF. Hgb/Hct/Plts:  9.9/28.9/71 .  The thrombocytopenia has been noted for some time.  He was on Lovenox bridging prior to surgery without adverse effects. INR 1.24 today after 1 dose of 7.5 last night No bleeding reported  Goal of Therapy:   INR 2-3    Monitor for bleeding complications, further drops in Platelet count.   Plan:   Repeat Coumadin 7.5 mg tonight. Daily INR's, CBC. LMWH 30 q12 until INR tx Watch Platelets closely while on Lovenox bridging. Herby Abraham, Pharm.D. [phone] 01/19/2013 1:54 PM

## 2013-01-19 NOTE — Progress Notes (Signed)
Patient ID: Jacob [name], male   DOB: Jul 19, 1946, 67 y.o.   MRN: [id number] PATIENT ID: Jacob [name]        MRN:  [id number]          DOB/AGE: 03/05/1946 / 67 y.o.    Jacob Campbell, MD   Jacob Code, PA-C [address] [place], [place]  [address]                             [phone]   PROGRESS NOTE  PATIENT SEEN AT 8:00AM  Subjective:  negative for Chest Pain  negative for Shortness of Breath  negative for Nausea/Vomiting   negative for Calf Pain    Tolerating Diet: yes         Patient reports pain as mild and moderate.     Had an episode of syncope last night while in bed.  Wife states that he has had those previously but not for a good while.  It apparently resolved and has not had any since.    Objective: Vital signs in last 24 hours:   Patient Vitals for the past 24 hrs:  BP Temp Pulse Resp SpO2 Height Weight  01/19/13 0800 - - - 16 - - -  01/19/13 0500 124/50 mmHg 98.6 F (37 C) 62 18 100 % - -  01/18/13 2300 120/48 mmHg 97.9 F (36.6 C) 65 16 100 % - -  01/18/13 2033 115/49 mmHg - 63 14 100 % - -  01/18/13 2000 - - - 16 100 % - -  01/18/13 1729 130/78 mmHg 99 F (37.2 C) 74 18 100 % - -  01/18/13 1707 - - - - - 6' 3.2" (1.91 m) 101.4 kg (223 lb 8.7 oz)  01/18/13 1600 - - - 15 94 % - -  01/18/13 1404 - - 76 15 100 % - -  01/18/13 1403 - - 69 11 100 % - -  01/18/13 1402 - - 73 13 100 % - -  01/18/13 1401 - - 69 16 100 % - -  01/18/13 1400 - 97.2 F (36.2 C) 81 14 100 % - -  01/18/13 1359 - - 70 17 100 % - -  01/18/13 1358 - - 70 12 100 % - -  01/18/13 1357 - - 66 22 100 % - -  01/18/13 1356 - - 67 13 100 % - -  01/18/13 1355 - - 69 22 100 % - -  01/18/13 1354 - - 65 15 100 % - -  01/18/13 1353 - - 75 14 100 % - -  01/18/13 1352 - - 63 15 100 % - -  01/18/13 1351 - - 68 14 100 % - -  01/18/13 1350 117/59 mmHg - 67 12 100 % - -  01/18/13 1349 - - 68 12 100 % - -  01/18/13 1348 - - 75 26 100 % - -  01/18/13 1347 - - 74 18 100 % - -  01/18/13  1346 - - 71 14 100 % - -  01/18/13 1345 - - 75 17 100 % - -  01/18/13 1344 - - 62 11 100 % - -  01/18/13 1343 - - 69 14 100 % - -  01/18/13 1342 - - 70 13 100 % - -  01/18/13 1341 - - 67 13 100 % - -  01/18/13 1340 - - 73 18 100 % - -  01/18/13 1339 - - 58 13 100 % - -  01/18/13 1338 - - 62 13 100 % - -  01/18/13 1337 - - 97 14 100 % - -  01/18/13 1336 - - 72 15 100 % - -  01/18/13 1335 126/68 mmHg - 75 11 100 % - -  01/18/13 1334 - - 67 15 100 % - -  01/18/13 1333 - - 48 14 100 % - -  01/18/13 1332 - - 62 14 100 % - -  01/18/13 1331 - - 79 17 100 % - -  01/18/13 1330 - - 64 13 100 % - -  01/18/13 1329 - - 39 11 100 % - -  01/18/13 1328 - - 64 15 100 % - -  01/18/13 1327 - - 69 12 100 % - -  01/18/13 1326 - - 89 15 100 % - -  01/18/13 1325 - - 62 12 100 % - -  01/18/13 1324 - - 59 15 100 % - -  01/18/13 1323 - - 63 12 100 % - -  01/18/13 1322 - - 64 15 100 % - -  01/18/13 1321 - - 65 14 100 % - -  01/18/13 1320 - - 71 14 100 % - -  01/18/13 1319 126/79 mmHg - 57 19 100 % - -  01/18/13 1318 - - 69 15 100 % - -  01/18/13 1317 - - 69 13 100 % - -  01/18/13 1316 - - 72 11 100 % - -  01/18/13 1315 - - 65 16 100 % - -  01/18/13 1314 - - 63 15 100 % - -  01/18/13 1313 - - 70 13 100 % - -  01/18/13 1312 - - 56 16 100 % - -  01/18/13 1311 - - 58 14 100 % - -  01/18/13 1310 - - 72 15 100 % - -  01/18/13 1309 - - 60 14 100 % - -  01/18/13 1308 - - 63 17 100 % - -  01/18/13 1307 - - 65 17 100 % - -  01/18/13 1306 - - 64 17 100 % - -  01/18/13 1305 115/101 mmHg - 69 13 100 % - -  01/18/13 1304 - - 71 14 100 % - -  01/18/13 1303 - - 80 14 100 % - -  01/18/13 1302 - - 76 19 100 % - -  01/18/13 1301 - - 70 19 100 % - -  01/18/13 1300 - 97.4 F (36.3 C) 72 16 100 % - -  01/18/13 1258 - - 58 15 99 % - -  01/18/13 1257 - - 59 23 98 % - -  01/18/13 1256 - - 75 19 99 % - -  01/18/13 1255 - - 66 14 98 % - -  01/18/13 1254 - - 71 18 99 % - -  01/18/13 1253 - - 61 20 100 % - -  01/18/13  1252 - - 73 15 99 % - -  01/18/13 1251 - - 74 15 100 % - -  01/18/13 1250 139/84 mmHg - 81 19 100 % - -  01/18/13 1249 - - 82 14 100 % - -  01/18/13 1248 - - 71 15 100 % - -      Intake/Output from previous day:   02/25 0701 - 02/26 0700 In: 2475 [I.V.:2425] Out: 950 [Urine:600]   Intake/Output this shift:  Intake/Output     02/25 0701 - 02/26 0700 02/26 0701 - 02/27 0700   I.V. (mL/kg) 2425 (23.9)    IV Piggyback 50    Total Intake(mL/kg) 2475 (24.4)    Urine (mL/kg/hr) 600    Blood 350    Total Output 950     Net +1525             LABORATORY DATA:  Recent Labs  01/13/13 1147 01/18/13 1623 01/18/13 1937 01/19/13 0624  WBC 5.1 5.8  --  5.1  HGB 13.2 11.0* 10.4* 9.9*  HCT 40.7 32.5* 31.2* 28.9*  PLT 79* 71*  --  71*    Recent Labs  01/13/13 1147 01/18/13 1623 01/19/13 0624  NA 144  --  137  K 4.6  --  3.6  CL 107  --  103  CO2 30  --  26  BUN 16  --  9  CREATININE 0.76 0.78 0.64  GLUCOSE 98  --  132*  CALCIUM 9.8  --  8.7   Lab Results  Component Value Date   INR 1.24 01/19/2013   INR 1.08 01/18/2013   INR 1.78* 01/13/2013    Recent Radiographic Studies :   Chest 2 View  01/18/2013  *RADIOLOGY REPORT*  Clinical Data: Preop hip replacement  CHEST - 2 VIEW  Comparison: PET CT scan 02/12/2012  Findings: Sternotomy wires overlie normal mediastinum and cardiac silhouette with ectatic aorta.  Lungs are mildly hyperinflated.  No effusion, infiltrate, pneumothorax. Small 8 mm rounded nodule projecting over the right lung base and similar nodule at the left lung bases are most consistent nipple shadows.  No aggressive osseous lesions.  IMPRESSION:   No acute cardiopulmonary findings.   Original Report Authenticated By: Genevive Bi, M.D.    Dg Pelvis Portable  01/18/2013  *RADIOLOGY REPORT*  Clinical Data: Left hip arthritis.  PORTABLE PELVIS  Comparison: None  Findings: AP portable view of the pelvis demonstrates that the acetabular and femoral components  of the new left total hip prosthesis appear in good position.  The tip of the stem is not included on this view but is visible on another AP view done today. Osseous structures of the pelvis appear normal.  IMPRESSION: Satisfactory appearance of the pelvis and left hip after left total hip prosthesis insertion.   Original Report Authenticated By: Francene Boyers, M.D.    Dg Femur Left Port  01/18/2013  *RADIOLOGY REPORT*  Clinical Data: Status post left hip arthroplasty.  PORTABLE LEFT FEMUR - 2 VIEW  Comparison: None.  Findings: The left hip arthroplasty shows normal alignment.  There is no evidence of fracture surrounding the femoral stem or more distally in the femur.  The soft tissues are unremarkable.  IMPRESSION: Normal alignment of left hip arthroplasty.  No evidence of femoral fracture.   Original Report Authenticated By: Irish Lack, M.D.      Examination:  General appearance: alert, cooperative and mild distress Resp: clear to auscultation bilaterally Cardio: irregularly irregular rhythm GI: soft, non-tender; bowel sounds normal; no masses,  no organomegaly  Wound Exam: clean, dry, intact dressing  Drainage:  None: wound tissue dry  Motor Exam: EHL, FHL, Anterior Tibial and Posterior Tibial Intact  Sensory Exam: Superficial Peroneal, Deep Peroneal and Tibial normal  Vascular Exam: Right posterior tibial artery has 1+ (weak) pulse  Assessment:    1 Day Post-Op  Procedure(s) (LRB): TOTAL HIP ARTHROPLASTY (Left)  ADDITIONAL DIAGNOSIS:  Principal Problem:   Osteoarthritis of left hip  Acute  Blood Loss Anemia Thrombocytopenia Syncopal event last night H/O afib  Plan: Physical Therapy as ordered Weight Bearing as Tolerated (WBAT)  DVT Prophylaxis:  Lovenox, Coumadin, Foot Pumps and TED hose  DISCHARGE PLAN: Home possible SNF  DISCHARGE NEEDS: HHPT, HHRN, Walker and 3-in-1 comode seat  EKG ordered with troponin Remote tele ordered        [facility] 01/19/2013 08:20 AM

## 2013-01-19 NOTE — Evaluation (Signed)
Physical Therapy Evaluation Patient Details Name: Jacob [name] MRN: [id number] DOB: 1946-06-14 Today's Date: 01/19/2013 Time: 4098-1191 PT Time Calculation (min): 36 min  PT Assessment / Plan / Recommendation Clinical Impression  pt rpesents with L THA.  pt moving well, but safety limited by cognition.  Unsure if this is baseline cognition.  pt will need DME and HHPT at D/C.      PT Assessment  Patient needs continued PT services    Follow Up Recommendations  Home health PT;Supervision/Assistance - 24 hour    Does the patient have the potential to tolerate intense rehabilitation      Barriers to Discharge None      Equipment Recommendations  Rolling walker with 5" wheels (3-in-1)    Recommendations for Other Services OT consult   Frequency 7X/week    Precautions / Restrictions Precautions Precautions: Posterior Hip;Fall Precaution Booklet Issued: Yes (comment) Restrictions Weight Bearing Restrictions: Yes LLE Weight Bearing: Weight bearing as tolerated   Pertinent Vitals/Pain Pt indicates soreness, but was premedicated.        Mobility  Bed Mobility Bed Mobility: Not assessed Transfers Transfers: Sit to Stand;Stand to Sit Sit to Stand: 3: Mod assist;With upper extremity assist;From bed Stand to Sit: 4: Min assist;With upper extremity assist;To chair/3-in-1;With armrests Details for Transfer Assistance: cues for UE use, positioning of LEs, controlling descent to chair.   Ambulation/Gait Ambulation/Gait Assistance: 4: Min assist Ambulation Distance (Feet): 100 Feet Assistive device: Rolling walker Ambulation/Gait Assistance Details: cues for positioning in RW, upright posture, use of RW, safety with RW.   Gait Pattern: Step-to pattern;Decreased step length - right;Decreased stance time - left;Trunk flexed Stairs: No Wheelchair Mobility Wheelchair Mobility: No    Exercises Total Joint Exercises Ankle Circles/Pumps: AROM;Both;10 reps Quad Sets: AROM;Both;10  reps Hip ABduction/ADduction: AAROM;Left;10 reps Long Arc Quad: AAROM;Left;10 reps   PT Diagnosis: Abnormality of gait;Acute pain  PT Problem List: Decreased strength;Decreased activity tolerance;Decreased balance;Decreased mobility;Decreased cognition;Decreased knowledge of use of DME;Decreased safety awareness;Decreased knowledge of precautions;Pain PT Treatment Interventions: DME instruction;Gait training;Stair training;Functional mobility training;Therapeutic activities;Therapeutic exercise;Balance training;Cognitive remediation;Patient/family education   PT Goals Acute Rehab PT Goals PT Goal Formulation: With patient Time For Goal Achievement: 01/26/13 Potential to Achieve Goals: Good Pt will go Supine/Side to Sit: with modified independence PT Goal: Supine/Side to Sit - Progress: Goal set today Pt will go Sit to Supine/Side: with modified independence PT Goal: Sit to Supine/Side - Progress: Goal set today Pt will go Sit to Stand: with modified independence PT Goal: Sit to Stand - Progress: Goal set today Pt will go Stand to Sit: with modified independence PT Goal: Stand to Sit - Progress: Goal set today Pt will Ambulate: >150 feet;with modified independence;with rolling walker PT Goal: Ambulate - Progress: Goal set today Pt will Go Up / Down Stairs: 1-2 stairs;with min assist;with rolling walker PT Goal: Up/Down Stairs - Progress: Goal set today Pt will Perform Home Exercise Program: with supervision, verbal cues required/provided PT Goal: Perform Home Exercise Program - Progress: Goal set today Additional Goals Additional Goal #1: pt will verbalize and follow hip precautions.   PT Goal: Additional Goal #1 - Progress: Goal set today  Visit Information  Last PT Received On: 01/19/13 Assistance Needed: +1    Subjective Data  Subjective: pt repeating lots of questions.   Patient Stated Goal: None stated.     Prior Functioning  Home Living Lives With: Spouse Available Help  at Discharge: Family;Available 24 hours/day Type of Home: House Home Access: Stairs to  enter Entrance Stairs-Number of Steps: 1,1 Entrance Stairs-Rails: None Home Layout: Two level;Able to live on main level with bedroom/bathroom Home Adaptive Equipment: None Prior Function Level of Independence: Independent Able to Take Stairs?: Yes Driving: Yes Communication Communication: No difficulties    Cognition  Cognition Overall Cognitive Status: Impaired Area of Impairment: Attention;Memory;Safety/judgement;Awareness of deficits;Problem solving;Executive functioning Arousal/Alertness: Awake/alert Orientation Level: Appears intact for tasks assessed Behavior During Session: [facility] for tasks performed Current Attention Level: Selective Memory Deficits: Decreased STM.   Safety/Judgement: Decreased safety judgement for tasks assessed;Impulsive;Decreased awareness of need for assistance;Decreased awareness of safety precautions Problem Solving: Slow to process    Extremity/Trunk Assessment Right Lower Extremity Assessment RLE ROM/Strength/Tone: WFL for tasks assessed RLE Sensation: WFL - Light Touch Left Lower Extremity Assessment LLE ROM/Strength/Tone: Deficits LLE ROM/Strength/Tone Deficits: Generally weak post-op.   LLE Sensation: WFL - Light Touch Trunk Assessment Trunk Assessment: Normal   Balance Balance Balance Assessed: No  End of Session PT - End of Session Equipment Utilized During Treatment: Gait belt Activity Tolerance: Patient tolerated treatment well Patient left: in chair;with call bell/phone within reach Nurse Communication: Mobility status  GP     Sunny Schlein, [place] [phone] 01/19/2013, 12:23 PM

## 2013-01-19 NOTE — Progress Notes (Signed)
UR COMPLETED  

## 2013-01-19 NOTE — Consult Note (Signed)
Admit date: 01/18/2013 Referring Physician  Dr. Cleophas Dunker Primary Physician No primary provider on file. Primary Cardiologist  Dr. Katrinka Blazing  Reason for Consultation  Afib, syncope  HPI: 67 year old male with long-standing history of atrial fibrillation on chronic anticoagulation with coronary artery disease status post bypass surgery, with left total hip replacement on 2/26 who yesterday while in bed, talking to his wife about the hip surgery briefly became unresponsive. According to his wife, his blood pressure was low at the time, he looked pale. No excessive diaphoresis. No nausea. He did not complain of any chest pain or significant palpitations previous to it. After a few seconds and after calling nurse over, he began to feel better. Troponin was drawn and was normal. Telemetry was also placed which thus far has been normal. EKG shows atrial fibrillation with normal ventricular response. No signs of ST segment changes.  He states that he has passed out about a half dozen times over his life. At one point in the 1990s, he was on an airplane and had a similar situation as described last night. 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  He is a prior history of factor V Leiden to his and DVT, he is on chronic anticoagulation. Dr. Katrinka Blazing has also been following a mild ascending aortic aneurysm.  Currently, his wife is at bedside and she described the experience last night. She witnessed. He does not remember. He is feeling normal today. Denies any chest pain, shortness of breath, palpitations.   PMH:   Past Medical History  Diagnosis Date  . Coronary artery disease   . Hypertension   . Factor 5 Leiden mutation, heterozygous   . DVT (deep venous thrombosis)   . Hemorrhoid   . Arthritis   . Cancer     . Skin cancer - basal cell. Forehead.  . Aortic aneurysm     be monitored    PSH:   Past Surgical History  Procedure Laterality Date  . Prostate surgery    . Cardiac catheterization  2000   . Colonscopy       Polyps   . Skin cancer excision    . Coronary artery bypass graft  2000    2 vesssels  . Back surgery      Laminectomy fusion- --L 4/5  . Total hip arthroplasty Left 01/18/2013    Dr Cleophas Dunker   Allergies:  Percocet Prior to Admit Meds:   Prescriptions prior to admission  Medication Sig Dispense Refill  . acetaminophen (TYLENOL) 500 MG tablet Take 500 mg by mouth 2 (two) times daily as needed for pain.      . Calcium-Magnesium-Vitamin D (CALCIUM 500 PO) Take 2 tablets by mouth 2 (two) times daily.      . celecoxib (CELEBREX) 200 MG capsule Take 200 mg by mouth 2 (two) times daily.      . Cholecalciferol (VITAMIN D3) 5000 UNITS TABS Take 1-2 tablets by mouth See admin instructions. Alternates 5000 to 10000 units every other day. Takes 5000 units every day then every other day takes 78295 units      . dutasteride (AVODART) 0.5 MG capsule Take 0.5 mg by mouth daily.      . folic acid (FOLVITE) 1 MG tablet Take 1 mg by mouth daily.      . magnesium gluconate (MAGONATE) 500 MG tablet Take 500 mg by mouth 2 (two) times daily.      Marland Kitchen MELATONIN PO Take 1 tablet by mouth at bedtime as needed (  sleep).      . metFORMIN (GLUCOPHAGE-XR) 750 MG 24 hr tablet Take 750 mg by mouth daily with breakfast.      . nebivolol (BYSTOLIC) 5 MG tablet Take 5 mg by mouth 2 (two) times daily.      . ramipril (ALTACE) 2.5 MG capsule Take 2.5 mg by mouth daily.      . rosuvastatin (CRESTOR) 40 MG tablet Take 40 mg by mouth daily.      Marland Kitchen warfarin (COUMADIN) 5 MG tablet Take 2.5-5 mg by mouth See admin instructions. Takes 7.5 mg (1.5 tab)  on Monday, Wednesday and Friday and takes 5 mg (1 tab)on Sunday, Tuesday, Thursday, and saturday       Fam HX:   History reviewed. No pertinent family history. Social HX:    History   Social History  . Marital Status: Married    Spouse Name: [name]    Number of Children: [name]  . Years of Education: [name]   Occupational History  . Not on file.   Social History  Main Topics  . Smoking status: Former Smoker -- 15 years    Quit date: 11/24/1985  . Smokeless tobacco: Never Used     Comment: quit 25 years ago  . Alcohol Use: No  . Drug Use: No  . Sexually Active: Not on file   Other Topics Concern  . Not on file   Social History Narrative  . No narrative on file     ROS:  Standard postoperative pain. No excessive unilateral leg swelling. All 11 ROS were addressed and are negative except what is stated in the HPI  Physical Exam: Blood pressure 119/57, pulse 92, temperature 98.8 F (37.1 C), temperature source Oral, resp. rate 18, height 6' 3.2" (1.91 m), weight 101.4 kg (223 lb 8.7 oz), SpO2 96.00%.    General: Well developed, well nourished, in no acute distress Head: Eyes PERRLA, No xanthomas.   Normal cephalic and atramatic  Lungs:   Clear bilaterally to auscultation and percussion. Normal respiratory effort. No wheezes, no rales. Heart:   Irregularly irregular, normal rate Pulses are 2+ & equal.            No carotid bruit. No JVD.  No abdominal bruits. No femoral bruits. Abdomen: Bowel sounds are positive, abdomen soft and non-tender without masses. No hepatosplenomegaly. Msk:  Back normal. Normal strength and tone for age. Extremities:   No clubbing, cyanosis or edema, support hose in place.  DP +1 Neuro: Alert and oriented X 3, non-focal, MAE x 4 GU: Deferred Rectal: Deferred Psych:  Good affect, responds appropriately    Labs:   Lab Results  Component Value Date   WBC 5.1 01/19/2013   HGB 9.9* 01/19/2013   HCT 28.9* 01/19/2013   MCV 83.3 01/19/2013   PLT 71* 01/19/2013    Recent Labs Lab 01/13/13 1147  01/19/13 0624  NA 144  --  137  K 4.6  --  3.6  CL 107  --  103  CO2 30  --  26  BUN 16  --  9  CREATININE 0.76  < > 0.64  CALCIUM 9.8  --  8.7  PROT 6.6  --   --   BILITOT 0.5  --   --   ALKPHOS 53  --   --   ALT 27  --   --   AST 30  --   --   GLUCOSE 98  --  132*  < > = values  in this interval not displayed. No  results found for this basename: PTT   Lab Results  Component Value Date   INR 1.24 01/19/2013   INR 1.08 01/18/2013   INR 1.78* 01/13/2013   Lab Results  Component Value Date   TROPONINI <0.30 01/19/2013        Radiology:   Chest 2 View  01/18/2013  *RADIOLOGY REPORT*  Clinical Data: Preop hip replacement  CHEST - 2 VIEW  Comparison: PET CT scan 02/12/2012  Findings: Sternotomy wires overlie normal mediastinum and cardiac silhouette with ectatic aorta.  Lungs are mildly hyperinflated.  No effusion, infiltrate, pneumothorax. Small 8 mm rounded nodule projecting over the right lung base and similar nodule at the left lung bases are most consistent nipple shadows.  No aggressive osseous lesions.  IMPRESSION:   No acute cardiopulmonary findings.   Original Report Authenticated By: Genevive Bi, M.D.    Dg Pelvis Portable  01/18/2013  *RADIOLOGY REPORT*  Clinical Data: Left hip arthritis.  PORTABLE PELVIS  Comparison: None  Findings: AP portable view of the pelvis demonstrates that the acetabular and femoral components of the new left total hip prosthesis appear in good position.  The tip of the stem is not included on this view but is visible on another AP view done today. Osseous structures of the pelvis appear normal.  IMPRESSION: Satisfactory appearance of the pelvis and left hip after left total hip prosthesis insertion.   Original Report Authenticated By: Francene Boyers, M.D.    Dg Femur Left Port  01/18/2013  *RADIOLOGY REPORT*  Clinical Data: Status post left hip arthroplasty.  PORTABLE LEFT FEMUR - 2 VIEW  Comparison: None.  Findings: The left hip arthroplasty shows normal alignment.  There is no evidence of fracture surrounding the femoral stem or more distally in the femur.  The soft tissues are unremarkable.  IMPRESSION: Normal alignment of left hip arthroplasty.  No evidence of femoral fracture.   Original Report Authenticated By: Irish Lack, M.D.    Personally viewed.  EKG:   Atrial fibrillation, no ST changes Personally viewed.   ASSESSMENT/PLAN:   67 year old male with syncopal episode last night, persistent atrial fibrillation, chronic anticoagulation, he states previously bridged with Lovenox, post left hip replacement.  1. Syncope-this fits with the previous description of syncopal episodes in the past as described by he and his wife. This may have been a vagal event. It is stated that his blood pressures were low at that time. Currently his blood pressures are normal. I'm going to check an echocardiogram to make sure that there is been no change in his structure or function. In June of 2013, ejection fraction was low normal at 50-55%. He had moderate mitral regurgitation as well. Aortic root was 41 mm. We will maintain hydration. Troponin is reassuring. He is at risk for pulmonary embolism post surgery which can sometimes present with syncope however he is not hypoxic, not tachypneic. He has been on Lovenox DVT prophylaxis dosing currently. He is back on warfarin. Awaiting INR increase. We will continue to monitor.  2. Atrial fibrillation-currently well rate controlled. Continue with Bystolic.  3. Chronic anticoagulation-awaiting INR increase. Currently on DVT prophylaxis dosing Lovenox. Patient previously requested Lovenox bridge prior to surgery. Dr. Kevan Ny, primary physician, provided.   4. CAD-stable, no exertional angina, reassuring troponin. Post bypass. Two-vessel, Dr. Tyrone Sage.  I will inform Dr. Katrinka Blazing.  Donato Schultz, MD  01/19/2013  1:24 PM

## 2013-01-19 NOTE — Anesthesia Postprocedure Evaluation (Signed)
Anesthesia Post Note  Patient: Jacob [name]  Procedure(s) Performed: Procedure(s) (LRB): TOTAL HIP ARTHROPLASTY (Left)  Anesthesia type: general  Patient location: PACU  Post pain: Pain level controlled  Post assessment: Patient's Cardiovascular Status Stable  Last Vitals:  Filed Vitals:   01/19/13 0800  BP:   Pulse:   Temp:   Resp: 16    Post vital signs: Reviewed and stable  Level of consciousness: sedated  Complications: No apparent anesthesia complications

## 2013-01-19 NOTE — Op Note (Signed)
NAMELEKENDRICK, ALPERN NO.:  [id number]  MEDICAL RECORD NO.:  [id number]  LOCATION:  5N04C                        FACILITY:  [facility]  PHYSICIAN:  Claude Manges. Beryl Hornberger, M.D.DATE OF BIRTH:  [birth year]  DATE OF PROCEDURE:  01/18/2013 DATE OF DISCHARGE:                              OPERATIVE REPORT   PREOPERATIVE DIAGNOSIS:  End-stage osteoarthritis, left hip.  POSTOPERATIVE DIAGNOSIS:  End-stage osteoarthritis, left hip.  PROCEDURES:  Left total hip replacement.  SURGEON:  Claude Manges. Cleophas Dunker, M.D.  ASSISTANT:  Arlys John D. Petrarca, P.A.-C.  ANESTHESIA:  General.  COMPLICATIONS:  None.  COMPONENTS:  DePuy with AML of 18-mm large stature femoral component with a 36-mm outer diameter hip ball, +8.5-mm neck length.  A 62-mm outer diameter sector 3 Gription metallic acetabular component with a Marathon polyethylene liner, +10-degree posterior lip and apex hole eliminator.  PROCEDURE:  Mr. Robson was met in the holding area, identified the left as the appropriate operative site.  The patient was then transported to room #7 and placed under general anesthesia without difficulty.  A Foley catheter was inserted with just a little difficulty at first, they moved from a 14 to a 12-French, then there was no problem.  There was no bleeding.  The urine was clear.  The patient was then placed in the lateral decubitus position with the left side up and secured to the operating room table with the Innomed hip system.  The left hip was then prepped with Betadine scrub and then DuraPrep from iliac crest to the ankle.  Sterile draping was performed.  A routine Southern incision was utilized and via sharp dissection, carried down to the subcutaneous tissue where sharp dissection, the superficial fascia was incised.  Any bleeding was Bovie coagulated. Self-retaining retractors were inserted.  The iliotibial band was identified and incised along its length of the skin  incision. Retractors were placed more deeply.  Short external rotators were identified.  They were tagged and then incised along the tendinous structures to the posterior aspect of the greater trochanter.  Capsule was identified and incised carefully with the Bovie.  There was a small clear yellow joint effusion.  The hip was then easily dislocated posteriorly and was osteotomized with the oscillating saw using the calcar guide.  The head was misshapen and at least 80% was devoid of articular cartilage.  There were several small osteocartilaginous loose bodies in the hip joint.  I initially prepared the femoral canal.  A starter hole was then made in the piriformis fossa.  Reaming was performed initially to 16 mm to accept a 16.5 component, but even rasping sequentially to a large component, there was still some toggling.  So then I re-reamed to 17.5 to accept an 18-mm component, re- reamed sequentially, and then inserted the large stature 18-mm component with a very nice fit.  Retractors were then placed about the acetabulum.  It was a large redundant labrum, which was sharply excised.  Retractors were carefully placed about the acetabulum for good visualization.  I did palpate the sciatic nerve, it was well out of the operative field.  Reaming was performed sequentially initially to 59-mm as I tried a 60-mm, thought it  would seat completely and then tried a 62, which was too tight.  So then I reamed to 61-mm to accept a 62-mm outer diameter component, thought that it was a perfect fit, had very nice bleeding circumferentially. There were several large cysts that I debrided and filled with bone from the acetabular reaming.  I then inserted the 62-mm outer diameter Gription 3 metallic acetabular component using the external guide to obtain flexion and abduction.  The trial polyethylene component was inserted followed by the 18-mm large stature femoral rasp.  We trialed several neck  lengths and thought that the 8.5-mm neck length provided perfect stability.  The patient's left lower extremity was about a quarter to half an inch short preoperatively and I thought we had reestablished leg lengths, did not appear to be tight in extension.  The trial components were then removed.  I re-impacted the acetabular component, and thought that it was perfectly stable without need for supplementary screws.  The wound was irrigated with saline solution copiously.  I inserted the apex hole eliminator followed by the final Marathon polyethylene liner.  We then carefully impacted the femoral component with a very nice fit and then I again re-trialed several neck lengths including the 5 and 8.5, thought that the 8.5 was more stable and re-establish leg length.  We cleaned the [facility] taper neck and then reapplied the 36-mm outer diameter hip ball with 8.5-cm neck length. This was then reduced after cleaning the acetabulum and then with full range of motion, we could not dislocate the hip.  We had a very stable construct.  The wound was then irrigated with saline solution.  Capsule was closed anatomically with interrupted #1 Ethibond.  Short external rotators were closed anatomically with the same suture.  The wound was again irrigated.  Deep capsule was infiltrated with 0.25% Marcaine with epinephrine.  The iliotibial band was closed with running 0 Vicryl, subcu with 2-0 Vicryl and 3-0 Monocryl.  Skin was closed with skin clips.  Sterile bulky dressing was applied followed by the patient's support stocking, was then carefully placed in the supine position. Transferred to the operating room stretcher and returned to the postanesthesia recovery room without complications.     Claude Manges. Cleophas Dunker, M.D.     PWW/MEDQ  D:  01/18/2013  T:  01/19/2013  Job:  [id number]

## 2013-01-19 NOTE — Progress Notes (Signed)
On call MD called back to check up on pt and stat labs.  Pt is stable, alert x4, VS bp 115/43, rr16, hr73, pulse 64, temp 98.3 and 100% on 2L via [place].  Pt has anxiety when staff enters the room, pt consoled with verbal reassurance.  Will continue to monitor for status changes.

## 2013-01-19 NOTE — Progress Notes (Signed)
Physical Therapy Note   01/19/13 1400  PT Visit Information  Last PT Received On 01/19/13  Assistance Needed +1  PT Time Calculation  PT Start Time 1331  PT Stop Time 1355  PT Time Calculation (min) 24 min  Subjective Data  Subjective So, maybe we talked about this, but can I put one foot behind the other?    Precautions  Precautions Posterior Hip;Fall  Precaution Comments pt verbalized 1 hip precautions.  Reviewed hip precautions.    Restrictions  Weight Bearing Restrictions Yes  LLE Weight Bearing WBAT  Cognition  Overall Cognitive Status Impaired  Area of Impairment Attention;Memory;Safety/judgement;Awareness of deficits;Problem solving;Executive functioning  Arousal/Alertness Awake/alert  Orientation Level Appears intact for tasks assessed  Behavior During Session [facility] for tasks performed  Current Attention Level Selective  Memory Deficits Decreased STM.    Safety/Judgement Decreased safety judgement for tasks assessed;Impulsive;Decreased awareness of need for assistance;Decreased awareness of safety precautions  Problem Solving Slow to process  Bed Mobility  Bed Mobility Supine to Sit;Sitting - Scoot to Edge of Bed;Sit to Supine  Supine to Sit 4: Min assist;HOB flat;With rails  Sitting - Scoot to Edge of Bed 5: Supervision  Sit to Supine 4: Min assist;With rail;HOB flat  Details for Bed Mobility Assistance cues for hip precautions and safe technique.    Transfers  Transfers Sit to Stand;Stand to Sit  Sit to Stand 4: Min assist;With upper extremity assist;From bed  Stand to Sit 4: Min assist;With upper extremity assist;To bed  Details for Transfer Assistance cues for UE use, positioning of LEs, controlling descent to bed.    Ambulation/Gait  Ambulation/Gait Assistance 4: Min guard  Ambulation Distance (Feet) 120 Feet  Assistive device Rolling walker  Ambulation/Gait Assistance Details cues for positioning in RW, upright posture, safety with mobility, hip precautions with  turns.    Gait Pattern Step-to pattern;Decreased step length - right;Decreased stance time - left;Trunk flexed  Stairs No  Wheelchair Mobility  Wheelchair Mobility No  Balance  Balance Assessed No  Exercises  Exercises Total Joint  Total Joint Exercises  Ankle Circles/Pumps AROM;Both;10 reps  Quad Sets AROM;Both;10 reps  Hip ABduction/ADduction AAROM;Left;10 reps  Heel Slides AAROM;Left;10 reps  PT - End of Session  Equipment Utilized During Treatment Gait belt  Activity Tolerance Patient tolerated treatment well  Patient left in bed;with call bell/phone within reach;with family/visitor present  Nurse Communication Mobility status  PT - Assessment/Plan  Comments on Treatment Session pt presents with L THA. pt continues to need cueing more for safety and cognitive deficits then mobility needs.    PT Plan Discharge plan remains appropriate;Frequency remains appropriate  PT Frequency 7X/week  Recommendations for Other Services OT consult  Follow Up Recommendations Home health PT;Supervision/Assistance - 24 hour  PT equipment Rolling walker with 5" wheels (3-in-1)  Acute Rehab PT Goals  Time For Goal Achievement 01/26/13  Potential to Achieve Goals Good  PT Goal: Supine/Side to Sit - Progress Progressing toward goal  PT Goal: Sit to Supine/Side - Progress Progressing toward goal  PT Goal: Sit to Stand - Progress Progressing toward goal  PT Goal: Stand to Sit - Progress Progressing toward goal  PT Goal: Ambulate - Progress Progressing toward goal  PT Goal: Perform Home Exercise Program - Progress Progressing toward goal  Additional Goals  PT Goal: Additional Goal #1 - Progress Progressing toward goal  PT General Charges  $$ ACUTE PT VISIT 1 Procedure  PT Treatments  $Gait Training 23-37 mins   Jacob [name], PT  [phone]  

## 2013-01-20 ENCOUNTER — Encounter (HOSPITAL_COMMUNITY): Payer: Self-pay | Admitting: Orthopaedic Surgery

## 2013-01-20 DIAGNOSIS — D6851 Activated protein C resistance: Secondary | ICD-10-CM | POA: Diagnosis present

## 2013-01-20 DIAGNOSIS — I4891 Unspecified atrial fibrillation: Secondary | ICD-10-CM | POA: Diagnosis present

## 2013-01-20 DIAGNOSIS — R55 Syncope and collapse: Secondary | ICD-10-CM | POA: Diagnosis not present

## 2013-01-20 LAB — BASIC METABOLIC PANEL
GFR calc Af Amer: >90 mL/min (ref >90)
GFR calc non Af Amer: >90 mL/min (ref >90)
Glucose, Bld: 130 mg/dL — ABNORMAL HIGH (ref 70–99)
Potassium: 3.7 mEq/L (ref 3.5–5.1)
Sodium: 138 mEq/L (ref 135–145)

## 2013-01-20 LAB — PROTIME-INR
INR: 1.32 (ref 0.00–1.49)
Prothrombin Time: 16.1 seconds — ABNORMAL HIGH (ref 11.6–15.2)

## 2013-01-20 LAB — CBC
Hemoglobin: 9.3 g/dL — ABNORMAL LOW (ref 13.0–17.0)
MCHC: 33.7 g/dL (ref 30.0–36.0)
Platelets: 68 10*3/uL — ABNORMAL LOW (ref 150–400)
RDW: 14.4 % (ref 11.5–15.5)

## 2013-01-20 MED ORDER — ENOXAPARIN SODIUM 30 MG/0.3ML ~~LOC~~ SOLN: 30.0000 mg | Freq: Two times a day (BID) | SUBCUTANEOUS | Status: DC

## 2013-01-20 MED ORDER — HYDROMORPHONE HCL 2 MG PO TABS: 1.0000 mg | ORAL_TABLET | ORAL | Status: DC | PRN

## 2013-01-20 MED ORDER — WARFARIN SODIUM 7.5 MG PO TABS
7.5000 mg | ORAL_TABLET | Freq: Once | ORAL | Status: DC
  Filled 2013-01-20: qty 1

## 2013-01-20 MED ORDER — METHOCARBAMOL 500 MG PO TABS: 500.0000 mg | ORAL_TABLET | Freq: Three times a day (TID) | ORAL | Status: DC | PRN

## 2013-01-20 NOTE — Progress Notes (Signed)
Patient ID: Jacob [name], male   DOB: [birth year], 67 y.o.   MRN: [id number] PATIENT ID: Jacob [name]        MRN:  [id number]          DOB/AGE: 1946/06/25 / 67 y.o.    Jacob Campbell, MD   Jacob Code, PA-C [address] Jacob [name], Jacob [name]  [id number]                             [phone]   PROGRESS NOTE  Subjective:  negative for Chest Pain  negative for Shortness of Breath  negative for Nausea/Vomiting   negative for Calf Pain    Tolerating Diet: yes         Patient reports pain as mild.     Slept well last night-no furthersyncopal episodes, great effort in PT  Objective: Vital signs in last 24 hours:   Patient Vitals for the past 24 hrs:  BP Temp Temp src Pulse Resp SpO2  01/20/13 1359 127/56 mmHg 98.7 F (37.1 C) - 89 18 98 %  01/20/13 1034 123/54 mmHg - - - - -  01/20/13 0636 124/65 mmHg 98.9 F (37.2 C) Oral 72 16 96 %  01/19/13 2014 118/50 mmHg 99.8 F (37.7 C) Oral 66 16 97 %      Intake/Output from previous day:   02/26 0701 - 02/27 0700 In: 960 [P.O.:960] Out: 1900 [Urine:1900]   Intake/Output this shift:   02/27 0701 - 02/27 1900 In: 480 [P.O.:480] Out: 600 [Urine:600]   Intake/Output     02/26 0701 - 02/27 0700 02/27 0701 - 02/28 0700   P.O. 960 480   I.V. (mL/kg)     IV Piggyback     Total Intake(mL/kg) 960 (9.5) 480 (4.7)   Urine (mL/kg/hr) 1900 (0.8) 600 (0.6)   Blood     Total Output 1900 600   Net -940 -120           LABORATORY DATA:  Recent Labs  01/18/13 1623 01/18/13 1937 01/19/13 0624 01/20/13 0655  WBC 5.8  --  5.1 5.7  HGB 11.0* 10.4* 9.9* 9.3*  HCT 32.5* 31.2* 28.9* 27.6*  PLT 71*  --  71* 68*    Recent Labs  01/18/13 1623 01/19/13 0624 01/20/13 0655  NA  --  137 138  K  --  3.6 3.7  CL  --  103 102  CO2  --  26 25  BUN  --  9 10  CREATININE 0.78 0.64 0.67  GLUCOSE  --  132* 130*  CALCIUM  --  8.7 8.9   Lab Results  Component Value Date   INR 1.32 01/20/2013   INR 1.24 01/19/2013   INR 1.08  01/18/2013    Recent Radiographic Studies :   Chest 2 View  01/18/2013  *RADIOLOGY REPORT*  Clinical Data: Preop hip replacement  CHEST - 2 VIEW  Comparison: PET CT scan 02/12/2012  Findings: Sternotomy wires overlie normal mediastinum and cardiac silhouette with ectatic aorta.  Lungs are mildly hyperinflated.  No effusion, infiltrate, pneumothorax. Small 8 mm rounded nodule projecting over the right lung base and similar nodule at the left lung bases are most consistent nipple shadows.  No aggressive osseous lesions.  IMPRESSION:   No acute cardiopulmonary findings.   Original Report Authenticated By: Genevive Bi, M.D.    Dg Pelvis Portable  01/18/2013  *RADIOLOGY REPORT*  Clinical Data: Left hip arthritis.  PORTABLE  PELVIS  Comparison: None  Findings: AP portable view of the pelvis demonstrates that the acetabular and femoral components of the new left total hip prosthesis appear in good position.  The tip of the stem is not included on this view but is visible on another AP view done today. Osseous structures of the pelvis appear normal.  IMPRESSION: Satisfactory appearance of the pelvis and left hip after left total hip prosthesis insertion.   Original Report Authenticated By: Francene Boyers, M.D.    Dg Femur Left Port  01/18/2013  *RADIOLOGY REPORT*  Clinical Data: Status post left hip arthroplasty.  PORTABLE LEFT FEMUR - 2 VIEW  Comparison: None.  Findings: The left hip arthroplasty shows normal alignment.  There is no evidence of fracture surrounding the femoral stem or more distally in the femur.  The soft tissues are unremarkable.  IMPRESSION: Normal alignment of left hip arthroplasty.  No evidence of femoral fracture.   Original Report Authenticated By: Irish Lack, M.D.      Examination:  General appearance: alert, cooperative and no distress  Wound Exam: clean, dry, intact   Drainage:  None: wound tissue dry  Motor Exam: EHL, FHL, Anterior Tibial and Posterior Tibial  Intact  Sensory Exam: Superficial Peroneal, Deep Peroneal and Tibial normal  Vascular Exam: Normal  Assessment:    2 Days Post-Op  Procedure(s) (LRB): TOTAL HIP ARTHROPLASTY (Left)  ADDITIONAL DIAGNOSIS:  Principal Problem:   Osteoarthritis of left hip Active Problems:   Neurologic cardiac syncope   Atrial fibrillation  Acute Blood Loss Anemia- asymptomatic   Plan: Physical Therapy as ordered Weight Bearing as Tolerated (WBAT)  DVT Prophylaxis:  Lovenox and Coumadin  DISCHARGE PLAN: Home  DISCHARGE NEEDS: HHPT   will D/C today- excellent progress, INR will be monitored by Dr Jacob [name], Jacob [name] 01/20/2013 4:18 PM

## 2013-01-20 NOTE — Evaluation (Signed)
Occupational Therapy Evaluation Patient Details Name: Jacob [name] MRN: [id number] DOB: Jun 12, 1946 Today's Date: 01/20/2013 Time: 4098-1191 OT Time Calculation (min): 34 min  OT Assessment / Plan / Recommendation Clinical Impression  Pt demos decline in function with ADLs, balance, safety and activity tolerance following L hip surgery. Pt would benefit from OT services to address these impairments to help restore PLOF to return home safely    OT Assessment  Patient needs continued OT Services    Follow Up Recommendations  Home health OT;Supervision/Assistance - 24 hour    Barriers to Discharge None    Equipment Recommendations  Tub/shower seat;Other (comment) (ADL A/E kit)    Recommendations for Other Services    Frequency  Min 2X/week    Precautions / Restrictions Precautions Precautions: Posterior Hip;Fall Precaution Booklet Issued: Yes (comment) Precaution Comments: pt verbalized 2/3 hip precautions.  Restrictions Weight Bearing Restrictions: Yes LLE Weight Bearing: Weight bearing as tolerated   Pertinent Vitals/Pain     ADL  Grooming: Performed;Wash/dry hands;Wash/dry face;Min guard;Set up Where Assessed - Grooming: Supported standing Upper Body Bathing: Set up;Simulated;Supervision/safety Lower Body Bathing: Simulated;Moderate assistance Upper Body Dressing: Performed;Supervision/safety;Set up Lower Body Dressing: Performed;Moderate assistance Toilet Transfer: Performed;Minimal assistance Toilet Transfer Method: Sit to stand;Other (comment) (ambulating from RW level) Toilet Transfer Equipment: Raised toilet seat with arms (or 3-in-1 over toilet) Toileting - Clothing Manipulation and Hygiene: Performed;Min guard Tub/Shower Transfer: Paramedic Method: Ambulating Transfers/Ambulation Related to ADLs: verbal cues for correct hand placement ADL Comments: pt and wife provided with education and demo of ADL A/E and shower chair  for use at home    OT Diagnosis: Generalized weakness;Acute pain  OT Problem List: Decreased knowledge of use of DME or AE;Decreased knowledge of precautions;Pain;Impaired balance (sitting and/or standing);Decreased safety awareness;Decreased activity tolerance;Decreased strength OT Treatment Interventions: Self-care/ADL training;Balance training;Therapeutic exercise;Neuromuscular education;Therapeutic activities;DME and/or AE instruction;Patient/family education   OT Goals Acute Rehab OT Goals OT Goal Formulation: With patient/family Time For Goal Achievement: 01/20/13 Potential to Achieve Goals: Good ADL Goals Pt Will Perform Grooming: with set-up;with supervision;Standing at sink ADL Goal: Grooming - Progress: Goal set today Pt Will Perform Lower Body Bathing: with min assist;with adaptive equipment ADL Goal: Lower Body Bathing - Progress: Goal set today Pt Will Perform Lower Body Dressing: with min assist;with adaptive equipment ADL Goal: Lower Body Dressing - Progress: Goal set today Pt Will Transfer to Toilet: with supervision;with DME ADL Goal: Toilet Transfer - Progress: Goal set today Pt Will Perform Toileting - Clothing Manipulation: with supervision;Standing ADL Goal: Toileting - Clothing Manipulation - Progress: Goal set today Pt Will Perform Tub/Shower Transfer: Shower transfer;with DME ADL Goal: Tub/Shower Transfer - Progress: Goal set today  Visit Information  Last OT Received On: 01/20/13 Assistance Needed: +1 PT/OT Co-Evaluation/Treatment: Yes    Subjective Data  Subjective: " I think I may go home this evening " Patient Stated Goal: To return home   Prior Functioning     Home Living Lives With: Spouse Available Help at Discharge: Family;Available 24 hours/day Type of Home: House Home Access: Stairs to enter [employer] of Steps: 1,1 Entrance Stairs-Rails: None Home Layout: Two level;Able to live on main level with bedroom/bathroom Bathroom  Shower/Tub: Health visitor: Handicapped height (with riser) Home Adaptive Equipment: None Prior Function Level of Independence: Independent Able to Take Stairs?: Yes Driving: Yes Vocation: Retired Musician: No difficulties Dominant Hand: Left         Vision/Perception Vision - History Baseline Vision: Wears glasses only for  reading Patient Visual Report: No change from baseline Perception Perception: Within Functional Limits   Cognition  Cognition Overall Cognitive Status: Impaired Area of Impairment: Attention;Memory;Safety/judgement;Awareness of deficits;Problem solving;Executive functioning Arousal/Alertness: Awake/alert Orientation Level: Appears intact for tasks assessed Behavior During Session: [facility] for tasks performed Current Attention Level: Selective Memory Deficits: Decreased STM.   Safety/Judgement: Decreased safety judgement for tasks assessed;Impulsive;Decreased awareness of need for assistance;Decreased awareness of safety precautions Problem Solving: Slow to process    Extremity/Trunk Assessment Right Upper Extremity Assessment RUE ROM/Strength/Tone: [facility] for tasks assessed Left Upper Extremity Assessment LUE ROM/Strength/Tone: [facility] for tasks assessed     Mobility Bed Mobility Bed Mobility: Supine to Sit;Sitting - Scoot to Edge of Bed Supine to Sit: 4: Min assist;With rails Sitting - Scoot to [place] of Bed: 5: Supervision Sit to Supine: 4: Min assist;With rail;HOB flat Details for Bed Mobility Assistance: cues for hip precautions and safe technique.   Transfers Sit to Stand: 4: Min assist;With upper extremity assist;From bed Stand to Sit: 4: Min assist;With upper extremity assist;To bed Details for Transfer Assistance: cues for hand placement and safety.        Balance Balance Balance Assessed: No   End of Session OT - End of Session Equipment Utilized During Treatment: Gait belt (shower chair, ADL A/E) Activity  Tolerance: Patient tolerated treatment well Patient left: in bed;with call bell/phone within reach;with family/visitor present  GO     Galen Manila 01/20/2013, 2:39 PM

## 2013-01-20 NOTE — Progress Notes (Signed)
Patient Name: Jacob [name] Date of Encounter: 01/20/2013    SUBJECTIVE: No CV complaints  TELEMETRY:  A fib with controlled rate: Filed Vitals:   01/19/13 1523 01/19/13 2014 01/20/13 0636 01/20/13 1034  BP:  118/50 124/65 123/54  Pulse:  66 72   Temp:  99.8 F (37.7 C) 98.9 F (37.2 C)   TempSrc:  Oral Oral   Resp: 16 16 16    Height:      Weight:      SpO2:  97% 96%     Intake/Output Summary (Last 24 hours) at 01/20/13 1058 Last data filed at 01/20/13 0636  Gross per 24 hour  Intake    960 ml  Output   1900 ml  Net   -940 ml    LABS: Basic Metabolic Panel:  Recent Labs  16/10/96 0624 01/20/13 0655  NA 137 138  K 3.6 3.7  CL 103 102  CO2 26 25  GLUCOSE 132* 130*  BUN 9 10  CREATININE 0.64 0.67  CALCIUM 8.7 8.9   CBC:  Recent Labs  01/19/13 0624 01/20/13 0655  WBC 5.1 5.7  HGB 9.9* 9.3*  HCT 28.9* 27.6*  MCV 83.3 83.9  PLT 71* 68*   Cardiac Enzymes:  Recent Labs  01/19/13 0823 01/19/13 1458 01/19/13 1954  TROPONINI <0.30 <0.30 <0.30   BNP: No components found with this basename: POCBNP,  Hemoglobin A1C:  Recent Labs  01/18/13 1623  HGBA1C 5.8*   Fasting Lipid Panel: No results found for this basename: CHOL, HDL, LDLCALC, TRIG, CHOLHDL, LDLDIRECT,  in the last 72 hours  Radiology/Studies:  No new  Physical Exam: Blood pressure 123/54, pulse 72, temperature 98.9 F (37.2 C), temperature source Oral, resp. rate 16, height 6' 3.2" (1.91 m), weight 101.4 kg (223 lb 8.7 oz), SpO2 96.00%. Weight change:    Irregular rhythm  ASSESSMENT:  1. Vasovagal near syncope, resolved. No evidence of cardiac injury and no change in exam. 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  Plan:  1. Cancel echo. No specific w/u needed.  Selinda Eon 01/20/2013, 10:58 AM

## 2013-01-20 NOTE — Clinical Social Work Note (Addendum)
CSW reviewed chart and SNF vs. HHPT was recommended by physical therapy. CSW spoke to the patient's spouse who stated she wanted HHPT. RNCM is aware of HH needs. CSW signing off, no other psychosocial concerns identified. Please re-consult as needed.  Lia Foyer, LCSWA [facility] Clinical Social Worker Contact #: [phone]

## 2013-01-20 NOTE — Progress Notes (Signed)
Physical Therapy Treatment Patient Details Name: Jacob [name] MRN: [id number] DOB: 1946-04-18 Today's Date: 01/20/2013 Time: 4401-0272 PT Time Calculation (min): 25 min  PT Assessment / Plan / Recommendation Comments on Treatment Session  pt presents with L THA. Requires cues for posterior hip precautions and safety with mobility secondary to decrease safety awareness and cognitive deficits. Pt progressing with mobility goals.     Follow Up Recommendations  Home health PT;Supervision/Assistance - 24 hour     Does the patient have the potential to tolerate intense rehabilitation     Barriers to Discharge        Equipment Recommendations  Rolling walker with 5" wheels (3-in-1)    Recommendations for Other Services    Frequency 7X/week   Plan Discharge plan remains appropriate;Frequency remains appropriate    Precautions / Restrictions Precautions Precautions: Posterior Hip;Fall Precaution Comments: pt verbalized 1 hip precautions.  Reviewed hip precautions.   Restrictions Weight Bearing Restrictions: Yes LLE Weight Bearing: Weight bearing as tolerated   Pertinent Vitals/Pain 5/10 pain. Feels better than yesterday.     Mobility  Bed Mobility Bed Mobility: Supine to Sit;Sitting - Scoot to Edge of Bed Supine to Sit: 4: Min assist;With rails (required min A to advance L LE to EOB) Sitting - Scoot to Edge of Bed: 5: Supervision Details for Bed Mobility Assistance: cues for hip precautions and safe technique.   Transfers Transfers: Sit to Stand;Stand to Sit Sit to Stand: 4: Min assist;With upper extremity assist;From bed Stand to Sit: 4: Min assist;With upper extremity assist;To bed Details for Transfer Assistance: cues for hand placement and safety, cues for controlling eccentric descent to chair.  Ambulation/Gait Ambulation/Gait Assistance: 4: Min guard Ambulation Distance (Feet): 150 Feet Assistive device: Rolling walker Ambulation/Gait Assistance Details: cues for  upright posture, safety with RW and hip precautions with turns. Gait Pattern: Step-to pattern;Decreased step length - right;Decreased stance time - left;Trunk flexed Stairs: Yes Stairs Assistance: 4: Min guard Stair Management Technique: Forwards;With walker (required cues for gait sequencing with stairs and safety ) Number of Stairs: 1 (x2) Wheelchair Mobility Wheelchair Mobility: No    Exercises     PT Diagnosis:    PT Problem List:   PT Treatment Interventions:     PT Goals Acute Rehab PT Goals PT Goal Formulation: With patient Time For Goal Achievement: 01/26/13 Potential to Achieve Goals: Good PT Goal: Supine/Side to Sit - Progress: Progressing toward goal PT Goal: Sit to Stand - Progress: Progressing toward goal PT Goal: Stand to Sit - Progress: Progressing toward goal PT Goal: Ambulate - Progress: Progressing toward goal PT Goal: Up/Down Stairs - Progress: Progressing toward goal Additional Goals PT Goal: Additional Goal #1 - Progress: Progressing toward goal  Visit Information  Last PT Received On: 01/20/13 Assistance Needed: +1    Subjective Data  Subjective: pt able to verbalize 1/3 hip precautions. Pt asked repeated questions. States he feels better today than yesterday.   Cognition  Cognition Overall Cognitive Status: Impaired Area of Impairment: Attention;Memory;Safety/judgement;Awareness of deficits;Problem solving;Executive functioning Arousal/Alertness: Awake/alert Orientation Level: Appears intact for tasks assessed Behavior During Session: [facility] for tasks performed Current Attention Level: Selective Memory Deficits: Decreased STM.   Safety/Judgement: Decreased safety judgement for tasks assessed;Impulsive;Decreased awareness of need for assistance;Decreased awareness of safety precautions Problem Solving: Slow to process    Balance  Balance Balance Assessed: No  End of Session PT - End of Session Equipment Utilized During Treatment: Gait  belt Activity Tolerance: Patient tolerated treatment well Patient left: in  chair;with call bell/phone within reach;with family/visitor present Nurse Communication: Mobility status   GP    Donnamarie Poag PT Kizzie Ide [place], [place] [phone] 01/20/2013, 9:25 AM

## 2013-01-20 NOTE — Progress Notes (Signed)
Physical Therapy Treatment Patient Details Name: Jacob [name] MRN: [id number] DOB: 12/14/1945 Today's Date: 01/20/2013 Time: 4098-1191 PT Time Calculation (min): 34 min  PT Assessment / Plan / Recommendation Comments on Treatment Session  Pt presents with L THA. Pt moving better today with less pain in L hip. Pt hopes to go home today. pt ready for D/C from PT stand point.     Follow Up Recommendations  Home health PT;Supervision/Assistance - 24 hour     Does the patient have the potential to tolerate intense rehabilitation     Barriers to Discharge        Equipment Recommendations   (3-in-1)    Recommendations for Other Services    Frequency 7X/week   Plan Discharge plan remains appropriate;Frequency remains appropriate    Precautions / Restrictions Precautions Precautions: Posterior Hip;Fall Precaution Booklet Issued: Yes (comment) Precaution Comments: pt verbalized 2/3 hip precautions.  Restrictions Weight Bearing Restrictions: Yes LLE Weight Bearing: Weight bearing as tolerated   Pertinent Vitals/Pain 0/10 pain. Premedicated.     Mobility  Bed Mobility Bed Mobility: Supine to Sit;Sitting - Scoot to Edge of Bed Supine to Sit: 4: Min assist;With rails Sitting - Scoot to [place] of Bed: 5: Supervision Sit to Supine: 4: Min assist;With rail;HOB flat Details for Bed Mobility Assistance: cues for hip precautions and safe technique.   Transfers Transfers: Sit to Stand;Stand to Sit Sit to Stand: 4: Min assist;With upper extremity assist;From bed Stand to Sit: 4: Min assist;With upper extremity assist;To bed Details for Transfer Assistance: cues for hand placement and safety. Ambulation/Gait Ambulation/Gait Assistance: 4: Min guard Ambulation Distance (Feet): 120 Feet (x2) Assistive device: Rolling walker Ambulation/Gait Assistance Details: cues for upright posture and safety with RW. Gait Pattern: Decreased step length - left;Decreased stance time - right Stairs:  No Wheelchair Mobility Wheelchair Mobility: No    Exercises Total Joint Exercises Ankle Circles/Pumps: AROM;15 reps Quad Sets: AROM;10 reps Heel Slides: 10 reps;AAROM Hip ABduction/ADduction: AAROM;10 reps   PT Diagnosis:    PT Problem List:   PT Treatment Interventions:     PT Goals Acute Rehab PT Goals PT Goal Formulation: With patient Time For Goal Achievement: 01/26/13 Potential to Achieve Goals: Good PT Goal: Supine/Side to Sit - Progress: Progressing toward goal PT Goal: Sit to Supine/Side - Progress: Progressing toward goal PT Goal: Sit to Stand - Progress: Progressing toward goal PT Goal: Stand to Sit - Progress: Progressing toward goal PT Goal: Ambulate - Progress: Progressing toward goal PT Goal: Perform Home Exercise Program - Progress: Progressing toward goal Additional Goals Additional Goal #1: pt will verbalize and follow hip precautions.   PT Goal: Additional Goal #1 - Progress: Progressing toward goal  Visit Information  Last PT Received On: 01/20/13 Assistance Needed: +1 PT/OT Co-Evaluation/Treatment: Yes    Subjective Data  Subjective: Pt reports pain 0/10 this afternoon. Hopes to go home today.    Cognition  Cognition Overall Cognitive Status: Impaired Area of Impairment: Attention;Memory;Safety/judgement;Awareness of deficits;Problem solving;Executive functioning Arousal/Alertness: Awake/alert Orientation Level: Appears intact for tasks assessed Behavior During Session: [facility] for tasks performed Current Attention Level: Selective Memory Deficits: Decreased STM.   Safety/Judgement: Decreased safety judgement for tasks assessed;Impulsive;Decreased awareness of need for assistance;Decreased awareness of safety precautions Problem Solving: Slow to process    Balance  Balance Balance Assessed: No  End of Session PT - End of Session Equipment Utilized During Treatment: Gait belt Activity Tolerance: Patient tolerated treatment well Patient left: in  bed;with call bell/phone within reach;with family/visitor present  Nurse Communication: Mobility status   GP    Donnamarie Poag, PT Kizzie Ide [place], [place] [phone] 01/20/2013, 2:37 PM

## 2013-01-20 NOTE — Plan of Care (Signed)
Problem: Phase III Progression Outcomes Goal: Discharge plan remains appropriate-arrangements made Recommend shower seat, ADL A/E kit for use at home after acute care d/c. Recommend HH OT for ADL trg and ADL mobility safety trg after acute care d/c

## 2013-01-20 NOTE — Discharge Summary (Addendum)
Jacob Campbell, MD   Jacob Code, PA-C [address], [place], [place]  [address]                             [phone]  PATIENT ID: Jacob [name]        MRN:  [id number]          DOB/AGE: February 20, 1946 / 67 y.o.    DISCHARGE SUMMARY  ADMISSION DATE:    01/18/2013 DISCHARGE DATE:   01/20/2013   ADMISSION DIAGNOSIS: Left Hip Osteoarthrosis    DISCHARGE DIAGNOSIS:  Left Hip Osteoarthrosis    ADDITIONAL DIAGNOSIS: Principal Problem:   Osteoarthritis of left hip Active Problems:   Neurologic cardiac syncope   Atrial fibrillation   Factor 5 Leiden mutation, heterozygous  Past Medical History  Diagnosis Date  . Coronary artery disease   . Hypertension   . Factor 5 Leiden mutation, heterozygous   . DVT (deep venous thrombosis)   . Hemorrhoid   . Arthritis   . Cancer     . Skin cancer - basal cell. Forehead.  . Aortic aneurysm     be monitored    PROCEDURE: Procedure(s): TOTAL HIP ARTHROPLASTY Lefton 01/18/2013  CONSULTS:  Treatment Team:  Lesleigh Noe, MD   HISTORY: Jacob Wendorff Medfordis a 67 y.o. male Who has a history of pain and functional disability in the left hip(s) due to arthritis and patient has failed non-surgical conservative treatments for greater than 12 weeks to include NSAID's and/or analgesics and activity modification. Onset of symptoms was gradual starting 1 years ago with gradually worsening course since that time.The patient noted no past surgery on the left hip(s). Patient currently rates pain in the left hip at 7 out of 10 with activity. Patient has night pain, worsening of pain with activity and weight bearing, trendelenberg gait, pain that interfers with activities of daily living and pain with passive range of motion. Patient has evidence of subchondral cysts, subchondral sclerosis, periarticular osteophytes and joint space narrowing by imaging studies. This condition presents safety issues increasing the risk of falls. There is no current active  infection.   HOSPITAL COURSE:  Jacob [name] is a 67 y.o. admitted on 01/18/2013 and found to have a diagnosis of Left Hip Osteoarthrosis.  After appropriate laboratory studies were obtained  they were taken to the operating room on 01/18/2013 and underwent  Procedure(s): TOTAL HIP ARTHROPLASTY Left.   They were given perioperative antibiotics:  Anti-infectives   Start     Dose/Rate Route Frequency Ordered Stop   01/18/13 1600  ceFAZolin (ANCEF) IVPB 2 g/50 mL premix     2 g 100 mL/hr over 30 Minutes Intravenous Every 6 hours 01/18/13 1539 01/18/13 2251   01/18/13 0600  ceFAZolin (ANCEF) IVPB 2 g/50 mL premix     2 g 100 mL/hr over 30 Minutes Intravenous On call to O.R. 01/17/13 1430 01/18/13 1010    .  Tolerated the procedure well.  Placed with a foley intraoperatively.  Given Ofirmev at induction and for 24 hours.   Toradol was given post op.  Had a vaso vagal episode night of surgery.  EKG revealed atrial flutter.  Fluid bolus given.  POD #1, consulted Cardiology,  Troponins ordered.  Allowed out of bed to a chair.  PT for ambulation and exercise program.  Foley D/C'd in morning.  IV saline locked.  O2 discontionued.  POD #2, continued PT and ambulation.  Cardiology  cleared.  Had previous episodes in the past.  Patient wanted to go home . The remainder of the hospital course was dedicated to ambulation and strengthening.   The patient was discharged on 2 Days Post-Op in  Stable condition.  Blood products given:none  DIAGNOSTIC STUDIES: Recent vital signs:  Patient Vitals for the past 24 hrs:  BP Temp Temp src Pulse Resp SpO2  01/20/13 1359 127/56 mmHg 98.7 F (37.1 C) - 89 18 98 %  01/20/13 1034 123/54 mmHg - - - - -  01/20/13 0636 124/65 mmHg 98.9 F (37.2 C) Oral 72 16 96 %  01/19/13 2014 118/50 mmHg 99.8 F (37.7 C) Oral 66 16 97 %       Recent laboratory studies:  Recent Labs  01/18/13 1623 01/18/13 1937 01/19/13 0624 01/20/13 0655  WBC 5.8  --  5.1 5.7  HGB  11.0* 10.4* 9.9* 9.3*  HCT 32.5* 31.2* 28.9* 27.6*  PLT 71*  --  71* 68*    Recent Labs  01/18/13 1623 01/19/13 0624 01/20/13 0655  NA  --  137 138  K  --  3.6 3.7  CL  --  103 102  CO2  --  26 25  BUN  --  9 10  CREATININE 0.78 0.64 0.67  GLUCOSE  --  132* 130*  CALCIUM  --  8.7 8.9   Lab Results  Component Value Date   INR 1.32 01/20/2013   INR 1.24 01/19/2013   INR 1.08 01/18/2013     Recent Radiographic Studies :   Chest 2 View  01/18/2013  *RADIOLOGY REPORT*  Clinical Data: Preop hip replacement  CHEST - 2 VIEW  Comparison: PET CT scan 02/12/2012  Findings: Sternotomy wires overlie normal mediastinum and cardiac silhouette with ectatic aorta.  Lungs are mildly hyperinflated.  No effusion, infiltrate, pneumothorax. Small 8 mm rounded nodule projecting over the right lung base and similar nodule at the left lung bases are most consistent nipple shadows.  No aggressive osseous lesions.  IMPRESSION:   No acute cardiopulmonary findings.   Original Report Authenticated By: Genevive Bi, M.D.    Dg Pelvis Portable  01/18/2013  *RADIOLOGY REPORT*  Clinical Data: Left hip arthritis.  PORTABLE PELVIS  Comparison: None  Findings: AP portable view of the pelvis demonstrates that the acetabular and femoral components of the new left total hip prosthesis appear in good position.  The tip of the stem is not included on this view but is visible on another AP view done today. Osseous structures of the pelvis appear normal.  IMPRESSION: Satisfactory appearance of the pelvis and left hip after left total hip prosthesis insertion.   Original Report Authenticated By: Francene Boyers, M.D.    Dg Femur Left Port  01/18/2013  *RADIOLOGY REPORT*  Clinical Data: Status post left hip arthroplasty.  PORTABLE LEFT FEMUR - 2 VIEW  Comparison: None.  Findings: The left hip arthroplasty shows normal alignment.  There is no evidence of fracture surrounding the femoral stem or more distally in the femur.  The soft  tissues are unremarkable.  IMPRESSION: Normal alignment of left hip arthroplasty.  No evidence of femoral fracture.   Original Report Authenticated By: Irish Lack, M.D.     DISCHARGE INSTRUCTIONS:     Discharge Orders   Future Appointments Provider Department Dept Phone   06/02/2013 8:30 AM [facility]  IMAGING AT [facility] [phone]   Patient to arrive 15 minutes prior to appointment time.   Future Orders  Complete By Expires     Call MD / Call 911  As directed     Comments:      If you experience chest pain or shortness of breath, CALL 911 and be transported to the hospital emergency room.  If you develope a fever above 101 F, pus (white drainage) or increased drainage or redness at the wound, or calf pain, call your surgeon's office.    Change dressing  As directed     Comments:      You may change your dressing on Sunday, then change the dressing daily with sterile 4 x 4 inch gauze dressing and paper tape.  You may clean the incision with alcohol prior to redressing    Constipation Prevention  As directed     Comments:      Drink plenty of fluids.  Prune juice and/or coffee may be helpful.  You may use a stool softener, such as Colace (over the counter) 100 mg twice a day.  Use MiraLax (over the counter) for constipation as needed but this may take several days to work.  Mag Citrate --OR-- Milk of Magnesia may also be used but follow directions on the label.    Diet - low sodium heart healthy  As directed     Driving restrictions  As directed     Comments:      No driving for 6 weeks    Follow the hip precautions as taught in Physical Therapy  As directed     Increase activity slowly as tolerated  As directed     Lifting restrictions  As directed     Comments:      No lifting for 6 weeks    Patient may shower  As directed     Comments:      You may shower over the pink dressing.  Once the dressing is removed you may shower without a dressing once there  is no drainage.  Do not wash over the wound.  If drainage remains, cover wound with plastic wrap and then shower.    TED hose  As directed     Comments:      Use stockings (TED hose) for 1-2 weeks on operative leg(s).  You may remove them at night for sleeping. May stop the NON-operative leg stocking when you go home.    Weight bearing as tolerated  As directed        DISCHARGE MEDICATIONS:     Medication List    STOP taking these medications       acetaminophen 500 MG tablet  Commonly known as:  TYLENOL     celecoxib 200 MG capsule  Commonly known as:  CELEBREX      TAKE these medications       CALCIUM 500 PO  Take 2 tablets by mouth 2 (two) times daily.     dutasteride 0.5 MG capsule  Commonly known as:  AVODART  Take 0.5 mg by mouth daily.     enoxaparin 30 MG/0.3ML injection  Commonly known as:  LOVENOX  Inject 0.3 mLs (30 mg total) into the skin every 12 (twelve) hours.     folic acid 1 MG tablet  Commonly known as:  FOLVITE  Take 1 mg by mouth daily.     HYDROmorphone 2 MG tablet  Commonly known as:  DILAUDID  Take 0.5 tablets (1 mg total) by mouth every 4 (four) hours as needed.     magnesium gluconate 500 MG  tablet  Commonly known as:  MAGONATE  Take 500 mg by mouth 2 (two) times daily.     MELATONIN PO  Take 1 tablet by mouth at bedtime as needed (sleep).     metFORMIN 750 MG 24 hr tablet  Commonly known as:  GLUCOPHAGE-XR  Take 750 mg by mouth daily with breakfast.     methocarbamol 500 MG tablet  Commonly known as:  ROBAXIN  Take 1 tablet (500 mg total) by mouth every 8 (eight) hours as needed (spasms).     nebivolol 5 MG tablet  Commonly known as:  BYSTOLIC  Take 5 mg by mouth 2 (two) times daily.     ramipril 2.5 MG capsule  Commonly known as:  ALTACE  Take 2.5 mg by mouth daily.     rosuvastatin 40 MG tablet  Commonly known as:  CRESTOR  Take 40 mg by mouth daily.     Vitamin D3 5000 UNITS Tabs  Take 1-2 tablets by mouth See admin  instructions. Alternates 5000 to 10000 units every other day. Takes 5000 units every day then every other day takes 16109 units     warfarin 5 MG tablet  Commonly known as:  COUMADIN  Take 2.5-5 mg by mouth See admin instructions. Takes 7.5 mg (1.5 tab)  on Monday, Wednesday and Friday and takes 5 mg (1 tab)on Sunday, Tuesday, Thursday, and saturday        FOLLOW UP VISIT:   Follow-up Information   Follow up with [facility], Claude Manges, MD. Schedule an appointment as soon as possible for a visit on 01/31/2013.   Contact information:   [address] [place] [address] [phone]       DISPOSITION:   Home  CONDITION:  Stable   Erving Sassano 01/20/2013, 4:59 PM

## 2013-01-20 NOTE — Consult Note (Signed)
ANTICOAGULATION CONSULT NOTE -follow up  Pharmacy Consult for Coumadin  Indication: factor V Leiden deficiency, history of DVT  Allergies: Allergies  Allergen Reactions  . Percocet (Oxycodone-Acetaminophen) Other (See Comments)    Paranoid    Height/Weight: Height: 6' 3.2" (191 cm) Weight: 223 lb 8.7 oz (101.4 kg) IBW/kg (Calculated) : 84.95  Vital Signs: BP 123/54  Pulse 72  Temp(Src) 98.9 F (37.2 C) (Oral)  Resp 16  Ht 6' 3.2" (1.91 m)  Wt 223 lb 8.7 oz (101.4 kg)  BMI 27.8 kg/m2  SpO2 96%  Active Problems: Principal Problem:   Osteoarthritis of left hip   Labs:  Recent Labs  01/18/13 1623 01/18/13 1751 01/18/13 1937 01/19/13 0624 01/20/13 0655  HGB 11.0*  --  10.4* 9.9* 9.3*  HCT 32.5*  --  31.2* 28.9* 27.6*  PLT 71*  --   --  71* 68*  LABPROT  --  13.9  --  15.4* 16.1*  INR  --  1.08  --  1.24 1.32  CREATININE 0.78  --   --  0.64 0.67    Estimated Creatinine Clearance: 106.3 ml/min (by C-G formula based on Cr of 0.67).   Assessment:  66 y.o.male s/p L-THA and medical history significant for Factor 5 Leiden mutation and DVT restarted on Coumadin for VTE prophylaxis.  Home dose of Coumadin is 5 mg TTSS and 7.5 mg MWF. Hgb/Hct/Plts:  9.3/27.69/68 .  The thrombocytopenia has been noted for some time.  He was on Lovenox bridging prior to surgery without adverse effects. INR 1.32 today after 2 doses of 7.5mg .  No bleeding reported  Goal of Therapy:   INR 2-3    Monitor for bleeding complications, further drops in Platelet count.   Plan:   Repeat Coumadin 7.5 mg tonight. Daily INR's, CBC. LMWH 30 q12 until INR tx Watch Platelets closely while on Lovenox bridging. Herby Abraham, Pharm.D. [phone] 01/20/2013 10:45 AM

## 2013-01-20 NOTE — Care Management Note (Signed)
    Page 1 of 2   01/20/2013     4:20:43 PM   CARE MANAGEMENT NOTE 01/20/2013  Patient:  Jacob [name],Jacob [name]   Account Number:  [id number]  Date Initiated:  01/19/2013  Documentation initiated by:  Vance Peper  Subjective/Objective Assessment:   67 yr old male s/p left total hip arthroplasty.     Action/Plan:   CM spoke with patient concerning home health and DME needs. Choice offered. patient states he has [name] raised toilet so doesnt need 3in1.   Anticipated DC Date:  01/20/2013   Anticipated DC Plan:  HOME W HOME HEALTH SERVICES      DC Planning Services  CM consult      PAC Choice  DURABLE MEDICAL EQUIPMENT  HOME HEALTH   Choice offered to / List presented to:  C-1 Patient   DME arranged  [facility]      DME agency  [employer]     HH arranged  HH-1 RN  HH-2 PT      HH agency  [facility]   Status of service:  Completed, signed off Medicare Important Message given?   (If response is "NO", the following Medicare IM given date fields will be blank) Date Medicare IM given:   Date Additional Medicare IM given:    Discharge Disposition:  HOME W HOME HEALTH SERVICES  Per UR Regulation:  Reviewed for med. necessity/level of care/duration of stay  If discussed at Long Length of Stay Meetings, dates discussed:    Comments:  01/20/13 16:12 Letha Cape RN, BSN [phone] patient lives with spouse, patient is for dc today, Mary with [facility] notified.  Spoke with patient and wife, they are ready to go but waiting for MD , MD now in room.  Spouse states she has an aide through private pay and they will be at their home tomorrow.  I asked if there was anything else I could for them she stated no they are ok now.

## 2013-03-22 ENCOUNTER — Other Ambulatory Visit: Payer: Self-pay | Admitting: Dermatology

## 2013-04-13 ENCOUNTER — Other Ambulatory Visit: Payer: Self-pay | Admitting: Internal Medicine

## 2013-04-13 DIAGNOSIS — R1310 Dysphagia, unspecified: Secondary | ICD-10-CM

## 2013-04-15 ENCOUNTER — Other Ambulatory Visit: Payer: Self-pay | Admitting: Dermatology

## 2013-04-21 ENCOUNTER — Ambulatory Visit
Admission: RE | Admit: 2013-04-21 | Discharge: 2013-04-21 | Disposition: A | Discharge status: Home / Self Care | Payer: Commercial Managed Care - PPO | Source: Ambulatory Visit | Attending: Internal Medicine | Admitting: Internal Medicine

## 2013-04-21 DIAGNOSIS — R1310 Dysphagia, unspecified: Secondary | ICD-10-CM

## 2013-05-06 ENCOUNTER — Ambulatory Visit
Admission: RE | Admit: 2013-05-06 | Discharge: 2013-05-06 | Disposition: A | Discharge status: Home / Self Care | Payer: Commercial Managed Care - PPO | Source: Ambulatory Visit | Attending: Internal Medicine | Admitting: Internal Medicine

## 2013-05-06 ENCOUNTER — Other Ambulatory Visit: Payer: Self-pay | Admitting: Internal Medicine

## 2013-05-06 DIAGNOSIS — R160 Hepatomegaly, not elsewhere classified: Secondary | ICD-10-CM

## 2013-05-24 ENCOUNTER — Other Ambulatory Visit: Payer: Self-pay | Admitting: *Deleted

## 2013-05-24 ENCOUNTER — Ambulatory Visit
Admission: RE | Admit: 2013-05-24 | Discharge: 2013-05-24 | Disposition: A | Discharge status: Home / Self Care | Payer: Commercial Managed Care - PPO | Source: Ambulatory Visit | Attending: Interventional Cardiology | Admitting: Interventional Cardiology

## 2013-05-24 DIAGNOSIS — R634 Abnormal weight loss: Secondary | ICD-10-CM

## 2013-05-24 DIAGNOSIS — C61 Malignant neoplasm of prostate: Secondary | ICD-10-CM

## 2013-05-24 DIAGNOSIS — C419 Malignant neoplasm of bone and articular cartilage, unspecified: Secondary | ICD-10-CM

## 2013-05-24 DIAGNOSIS — I719 Aortic aneurysm of unspecified site, without rupture: Secondary | ICD-10-CM

## 2013-05-24 MED ORDER — IOHEXOL 350 MG/ML SOLN
100.0000 mL | Freq: Once | INTRAVENOUS | Status: AC | PRN
  Administered 2013-05-24: 100 mL via INTRAVENOUS

## 2013-05-26 ENCOUNTER — Ambulatory Visit
Admission: RE | Admit: 2013-05-26 | Discharge: 2013-05-26 | Disposition: A | Discharge status: Home / Self Care | Payer: Commercial Managed Care - PPO | Source: Ambulatory Visit | Attending: *Deleted | Admitting: *Deleted

## 2013-05-26 DIAGNOSIS — R634 Abnormal weight loss: Secondary | ICD-10-CM

## 2013-05-26 DIAGNOSIS — C419 Malignant neoplasm of bone and articular cartilage, unspecified: Secondary | ICD-10-CM

## 2013-05-26 DIAGNOSIS — C61 Malignant neoplasm of prostate: Secondary | ICD-10-CM

## 2013-05-26 MED ORDER — IOHEXOL 300 MG/ML  SOLN
125.0000 mL | Freq: Once | INTRAMUSCULAR | Status: AC | PRN
  Administered 2013-05-26: 125 mL via INTRAVENOUS

## 2013-06-02 ENCOUNTER — Other Ambulatory Visit: Payer: Commercial Managed Care - PPO

## 2013-06-06 ENCOUNTER — Other Ambulatory Visit: Payer: Self-pay | Admitting: Internal Medicine

## 2013-06-06 DIAGNOSIS — R630 Anorexia: Secondary | ICD-10-CM

## 2013-06-06 DIAGNOSIS — R634 Abnormal weight loss: Secondary | ICD-10-CM

## 2013-06-08 ENCOUNTER — Other Ambulatory Visit: Payer: Self-pay | Admitting: Internal Medicine

## 2013-06-08 DIAGNOSIS — E041 Nontoxic single thyroid nodule: Secondary | ICD-10-CM

## 2013-06-10 ENCOUNTER — Ambulatory Visit
Admission: RE | Admit: 2013-06-10 | Discharge: 2013-06-10 | Disposition: A | Discharge status: Home / Self Care | Payer: Commercial Managed Care - PPO | Source: Ambulatory Visit | Attending: Internal Medicine | Admitting: Internal Medicine

## 2013-06-10 DIAGNOSIS — R630 Anorexia: Secondary | ICD-10-CM

## 2013-06-10 DIAGNOSIS — E041 Nontoxic single thyroid nodule: Secondary | ICD-10-CM

## 2013-06-10 DIAGNOSIS — R634 Abnormal weight loss: Secondary | ICD-10-CM

## 2013-06-15 ENCOUNTER — Other Ambulatory Visit: Payer: Self-pay | Admitting: Internal Medicine

## 2013-06-15 DIAGNOSIS — E041 Nontoxic single thyroid nodule: Secondary | ICD-10-CM

## 2013-06-16 ENCOUNTER — Other Ambulatory Visit (HOSPITAL_COMMUNITY)
Admission: RE | Admit: 2013-06-16 | Discharge: 2013-06-16 | Disposition: A | Discharge status: Home / Self Care | Payer: Commercial Managed Care - PPO | Source: Ambulatory Visit | Attending: Internal Medicine | Admitting: Internal Medicine

## 2013-06-16 ENCOUNTER — Ambulatory Visit
Admission: RE | Admit: 2013-06-16 | Discharge: 2013-06-16 | Disposition: A | Discharge status: Home / Self Care | Payer: Commercial Managed Care - PPO | Source: Ambulatory Visit | Attending: Internal Medicine | Admitting: Internal Medicine

## 2013-06-16 DIAGNOSIS — E049 Nontoxic goiter, unspecified: Secondary | ICD-10-CM | POA: Insufficient documentation

## 2013-06-16 DIAGNOSIS — E041 Nontoxic single thyroid nodule: Secondary | ICD-10-CM

## 2013-06-21 ENCOUNTER — Other Ambulatory Visit: Payer: Self-pay | Admitting: Dermatology

## 2013-06-22 ENCOUNTER — Other Ambulatory Visit (HOSPITAL_COMMUNITY): Payer: Self-pay | Admitting: Gastroenterology

## 2013-06-22 DIAGNOSIS — R1310 Dysphagia, unspecified: Secondary | ICD-10-CM

## 2013-06-23 DIAGNOSIS — M25559 Pain in unspecified hip: Secondary | ICD-10-CM | POA: Diagnosis not present

## 2013-06-23 DIAGNOSIS — M546 Pain in thoracic spine: Secondary | ICD-10-CM | POA: Diagnosis not present

## 2013-06-27 ENCOUNTER — Ambulatory Visit (HOSPITAL_COMMUNITY)
Admission: RE | Admit: 2013-06-27 | Discharge: 2013-06-27 | Disposition: A | Discharge status: Home / Self Care | Payer: Commercial Managed Care - PPO | Source: Ambulatory Visit | Attending: Gastroenterology | Admitting: Gastroenterology

## 2013-06-27 DIAGNOSIS — R0601 Orthopnea: Secondary | ICD-10-CM | POA: Insufficient documentation

## 2013-06-27 DIAGNOSIS — I10 Essential (primary) hypertension: Secondary | ICD-10-CM | POA: Insufficient documentation

## 2013-06-27 DIAGNOSIS — R1310 Dysphagia, unspecified: Secondary | ICD-10-CM

## 2013-06-27 DIAGNOSIS — R1313 Dysphagia, pharyngeal phase: Secondary | ICD-10-CM | POA: Insufficient documentation

## 2013-06-27 NOTE — Procedures (Signed)
Objective Swallowing Evaluation: Modified Barium Swallowing Study  Patient Details  Name: Jacob [name] MRN: [id number] Date of Birth: 05-Sep-1946  Today's Date: 06/27/2013 Time: 1040-1200 SLP Time Calculation (min): 80 min  Past Medical History:  Past Medical History  Diagnosis Date  . Coronary artery disease   . Hypertension   . Factor 5 Leiden mutation, heterozygous   . DVT (deep venous thrombosis)   . Hemorrhoid   . Arthritis   . Cancer     . Skin cancer - basal cell. Forehead.  . Aortic aneurysm     be monitored   Past Surgical History:  Past Surgical History  Procedure Laterality Date  . Prostate surgery    . Cardiac catheterization  2000  . Colonscopy       Polyps   . Skin cancer excision    . Coronary artery bypass graft  2000    2 vesssels  . Back surgery      Laminectomy fusion- --L 4/5  . Total hip arthroplasty Left 01/18/2013    Dr Cleophas Dunker  . Total hip arthroplasty Left 01/18/2013    Procedure: TOTAL HIP ARTHROPLASTY;  Surgeon: Valeria Batman, MD;  Location: [facility] OR;  Service: Orthopedics;  Laterality: Left;  Left Total Hip Arthroplasty   HPI:  Jacob [name] is 67 yrs old and seen for outpatient MBS accompanied by his wife.  Pt. with complaints of globus sensation in pharynx, coughing, changes in vocal qualtiy and throat clearing frequently during meals and approximately 25 pound unintentional weight loss since May of this year.  PMH:  prostate CA, A-fib, CABG, hip fx (12/2012), DVT, syncopal episodes, HTN, DAD.  Pt. has had several scans recently to investigate etiology of weight loss.  He had a UGI 05/2013 in which he aspirated barium and ST consult was ordered.           Assessment / Plan / Recommendation Clinical Impression  Dysphagia Diagnosis: Severe pharyngeal phase dysphagia;Moderate pharyngeal phase dysphagia Clinical impression: Pt. exhibited a moderate to severe pharygeal dysphagia primarily due to motor impairments characterized by reduced tongue base  retraction, decreased laryngeal elevation decreased anterior hyoid movement and epiglottic deflection.  Pharyngeal deficits resulted in laryngeal penetration to his vocal cords with thin and nectar consistency barium and silent aspiration x 2 with thin.  Decreased pharyngeal range of motion also led to moderate-maximum residue post swallow in valleculae and moderate pyriform sinus residue (with penetration after swallow).  Many compensatory techniques and head positions were performed to prevent vestibule penetration and clearance of pharyngeal residue (chin tuck, head rotation multiple and effortful swallows).  Most effective in attempting to reduce penetration and residue were small sips via cup, volitional multiple swallows and volitional coughs/throat clear and coughing ("hocking") pharyngeal residue into oral cavity.  Although eating is very fatiguing and cumbersome for pt. and may lead to lack of desire to intake po's, it would not account for the amount of weight loss he has experienced.  Wife reports that he actually eats almost the same amount as normal but may extend it throughout the day.  His swallow dysfunction resembles that of a neurological impairment and SLP recommends a possible neurology consult to further assess (SLP also witnessed pt with mild loss of balance x 1).  SLP recommends he continue thin liquids and regular texture diet using extra caution with dry crumbly foods, tough meats (use gravy), swallow 2-3 extra times, hard cough after swallows, alternate liquids and solids, pills with water unless large and recommend  put in tsp yogurt.          Treatment Recommendation  No treatment recommended at this time    Diet Recommendation Regular;Thin liquid   Liquid Administration via: Cup;No straw Medication Administration: Whole meds with liquid Supervision: Patient able to self feed Compensations: Slow rate;Small sips/bites;Multiple dry swallows after each bite/sip;Follow solids with  liquid;Hard cough after swallow;Effortful swallow;Clear throat intermittently Postural Changes and/or Swallow Maneuvers: Seated upright 90 degrees;Upright 30-60 min after meal    Other  Recommendations Recommended Consults:  (neurology consult) Oral Care Recommendations: Oral care BID   Follow Up Recommendations  None    noneFrequency and Duration        Pertinent Vitals/Pain           Reason for Referral Objectively evaluate swallowing function   Oral Phase Oral Preparation/Oral Phase Oral Phase: WFL   Pharyngeal Phase Pharyngeal Phase Pharyngeal Phase: Impaired Pharyngeal - Nectar Pharyngeal - Nectar Cup: Pharyngeal residue - valleculae;Pharyngeal residue - pyriform sinuses;Reduced laryngeal elevation;Reduced anterior laryngeal mobility;Reduced pharyngeal peristalsis;Reduced airway/laryngeal closure;Compensatory strategies attempted (Comment);Reduced tongue base retraction;Penetration/Aspiration after swallow;Penetration/Aspiration during swallow (chin tuck) Penetration/Aspiration details (nectar cup): Material enters airway, CONTACTS cords and not ejected out;Material enters airway, remains ABOVE vocal cords and not ejected out Pharyngeal - Thin Pharyngeal - Thin Cup: Reduced pharyngeal peristalsis;Reduced anterior laryngeal mobility;Reduced laryngeal elevation;Reduced airway/laryngeal closure;Reduced tongue base retraction;Penetration/Aspiration during swallow;Penetration/Aspiration after swallow;Pharyngeal residue - valleculae;Pharyngeal residue - pyriform sinuses (left head turn and chin tuck) Penetration/Aspiration details (thin cup): Material enters airway, CONTACTS cords and not ejected out;Material enters airway, passes BELOW cords without attempt by patient to eject out (silent aspiration);Material enters airway, remains ABOVE vocal cords and not ejected out Pharyngeal - Solids Pharyngeal - Regular: Pharyngeal residue - valleculae;Pharyngeal residue - pyriform sinuses;Reduced  anterior laryngeal mobility;Reduced laryngeal elevation;Reduced tongue base retraction;Reduced pharyngeal peristalsis  Cervical Esophageal Phase    GO    Cervical Esophageal Phase Cervical Esophageal Phase: [facility]    Functional Assessment Tool Used: clinical judgement Functional Limitations: Swallowing Swallow Current Status ([id number]): At least 80 percent but less than 100 percent impaired, limited or restricted Swallow Goal Status [phone]): At least 80 percent but less than 100 percent impaired, limited or restricted Swallow Discharge Status [phone]): At least 80 percent but less than 100 percent impaired, limited or restricted    Royce Macadamia M.Ed [employer] [phone]  06/27/2013

## 2013-06-28 DIAGNOSIS — M25559 Pain in unspecified hip: Secondary | ICD-10-CM | POA: Diagnosis not present

## 2013-07-14 DIAGNOSIS — M25559 Pain in unspecified hip: Secondary | ICD-10-CM | POA: Diagnosis not present

## 2013-07-14 DIAGNOSIS — M546 Pain in thoracic spine: Secondary | ICD-10-CM | POA: Diagnosis not present

## 2013-07-19 DIAGNOSIS — M25559 Pain in unspecified hip: Secondary | ICD-10-CM | POA: Diagnosis not present

## 2013-07-22 ENCOUNTER — Emergency Department (HOSPITAL_COMMUNITY): Payer: Commercial Managed Care - PPO

## 2013-07-22 ENCOUNTER — Observation Stay (HOSPITAL_COMMUNITY)
Admission: EM | Admit: 2013-07-22 | Discharge: 2013-07-23 | Disposition: A | Discharge status: Home / Self Care | Payer: Commercial Managed Care - PPO | Attending: Internal Medicine | Admitting: Internal Medicine

## 2013-07-22 ENCOUNTER — Encounter (HOSPITAL_COMMUNITY): Payer: Self-pay | Admitting: *Deleted

## 2013-07-22 DIAGNOSIS — I252 Old myocardial infarction: Secondary | ICD-10-CM | POA: Insufficient documentation

## 2013-07-22 DIAGNOSIS — F411 Generalized anxiety disorder: Secondary | ICD-10-CM | POA: Insufficient documentation

## 2013-07-22 DIAGNOSIS — R55 Syncope and collapse: Principal | ICD-10-CM | POA: Diagnosis present

## 2013-07-22 DIAGNOSIS — N400 Enlarged prostate without lower urinary tract symptoms: Secondary | ICD-10-CM | POA: Insufficient documentation

## 2013-07-22 DIAGNOSIS — R42 Dizziness and giddiness: Secondary | ICD-10-CM | POA: Insufficient documentation

## 2013-07-22 DIAGNOSIS — M1612 Unilateral primary osteoarthritis, left hip: Secondary | ICD-10-CM | POA: Diagnosis present

## 2013-07-22 DIAGNOSIS — Z7901 Long term (current) use of anticoagulants: Secondary | ICD-10-CM | POA: Insufficient documentation

## 2013-07-22 DIAGNOSIS — T148XXA Other injury of unspecified body region, initial encounter: Secondary | ICD-10-CM | POA: Diagnosis not present

## 2013-07-22 DIAGNOSIS — M169 Osteoarthritis of hip, unspecified: Secondary | ICD-10-CM | POA: Insufficient documentation

## 2013-07-22 DIAGNOSIS — D6859 Other primary thrombophilia: Secondary | ICD-10-CM | POA: Insufficient documentation

## 2013-07-22 DIAGNOSIS — Z86718 Personal history of other venous thrombosis and embolism: Secondary | ICD-10-CM | POA: Insufficient documentation

## 2013-07-22 DIAGNOSIS — Z951 Presence of aortocoronary bypass graft: Secondary | ICD-10-CM | POA: Insufficient documentation

## 2013-07-22 DIAGNOSIS — R5381 Other malaise: Secondary | ICD-10-CM | POA: Insufficient documentation

## 2013-07-22 DIAGNOSIS — I10 Essential (primary) hypertension: Secondary | ICD-10-CM | POA: Insufficient documentation

## 2013-07-22 DIAGNOSIS — I4891 Unspecified atrial fibrillation: Secondary | ICD-10-CM | POA: Diagnosis present

## 2013-07-22 DIAGNOSIS — I517 Cardiomegaly: Secondary | ICD-10-CM | POA: Insufficient documentation

## 2013-07-22 DIAGNOSIS — Z8546 Personal history of malignant neoplasm of prostate: Secondary | ICD-10-CM | POA: Insufficient documentation

## 2013-07-22 DIAGNOSIS — I2510 Atherosclerotic heart disease of native coronary artery without angina pectoris: Secondary | ICD-10-CM | POA: Insufficient documentation

## 2013-07-22 DIAGNOSIS — S8010XA Contusion of unspecified lower leg, initial encounter: Secondary | ICD-10-CM | POA: Insufficient documentation

## 2013-07-22 DIAGNOSIS — R404 Transient alteration of awareness: Secondary | ICD-10-CM | POA: Diagnosis not present

## 2013-07-22 DIAGNOSIS — R0602 Shortness of breath: Secondary | ICD-10-CM | POA: Insufficient documentation

## 2013-07-22 DIAGNOSIS — D6851 Activated protein C resistance: Secondary | ICD-10-CM

## 2013-07-22 DIAGNOSIS — M1610 Unilateral primary osteoarthritis, unspecified hip: Secondary | ICD-10-CM | POA: Insufficient documentation

## 2013-07-22 DIAGNOSIS — R634 Abnormal weight loss: Secondary | ICD-10-CM | POA: Insufficient documentation

## 2013-07-22 DIAGNOSIS — X58XXXA Exposure to other specified factors, initial encounter: Secondary | ICD-10-CM | POA: Insufficient documentation

## 2013-07-22 DIAGNOSIS — R110 Nausea: Secondary | ICD-10-CM | POA: Insufficient documentation

## 2013-07-22 LAB — PROTIME-INR: INR: 1.07 (ref 0.00–1.49)

## 2013-07-22 LAB — BASIC METABOLIC PANEL
CO2: 25 mEq/L (ref 19–32)
Chloride: 104 mEq/L (ref 96–112)
Glucose, Bld: 107 mg/dL — ABNORMAL HIGH (ref 70–99)
Sodium: 138 mEq/L (ref 135–145)

## 2013-07-22 LAB — CBC WITH DIFFERENTIAL/PLATELET
Basophils Relative: 1 % (ref 0–1)
Eosinophils Relative: 1 % (ref 0–5)
Hemoglobin: 9.4 g/dL — ABNORMAL LOW (ref 13.0–17.0)
Lymphs Abs: 1.1 10*3/uL (ref 0.7–4.0)
MCH: 27.1 pg (ref 26.0–34.0)
MCV: 82.4 fL (ref 78.0–100.0)
Monocytes Relative: 11 % (ref 3–12)
Neutrophils Relative %: 57 % (ref 43–77)
Platelets: 90 10*3/uL — ABNORMAL LOW (ref 150–400)
RBC: 3.47 MIL/uL — ABNORMAL LOW (ref 4.22–5.81)
WBC: 3.8 10*3/uL — ABNORMAL LOW (ref 4.0–10.5)

## 2013-07-22 LAB — HEPATIC FUNCTION PANEL
ALT: 23 U/L (ref 0–53)
Albumin: 3.4 g/dL — ABNORMAL LOW (ref 3.5–5.2)
Alkaline Phosphatase: 51 U/L (ref 39–117)
Total Bilirubin: 0.8 mg/dL (ref 0.3–1.2)
Total Protein: 5.7 g/dL — ABNORMAL LOW (ref 6.0–8.3)

## 2013-07-22 LAB — URINALYSIS, ROUTINE W REFLEX MICROSCOPIC
Bilirubin Urine: NEGATIVE
Glucose, UA: NEGATIVE mg/dL
Hgb urine dipstick: NEGATIVE
Ketones, ur: NEGATIVE mg/dL
pH: 7.5 (ref 5.0–8.0)

## 2013-07-22 MED ORDER — ONDANSETRON HCL 4 MG/2ML IJ SOLN: 4.0000 mg | Freq: Three times a day (TID) | INTRAMUSCULAR | Status: AC | PRN

## 2013-07-22 NOTE — ED Provider Notes (Signed)
Medical screening examination/treatment/procedure(s) were conducted as a shared visit with non-physician practitioner(s) and myself.  I personally evaluated the patient during the encounter  Patient here with near-syncope. Patient has had extensive work-up for unintended weight loss recently. Orthostatic near-syncope today. Patient here with normal labs, normal EKG. Hospitalist consulted. Hospitalist initially wanted admission after hearing story, however after talking to patient, hospitalist, Dr. Allena Katz, comfortable with discharge. Patient is feeling better. I agree with plan as patient is reasonable.   Dagmar Hait, MD 07/22/13 [phone]

## 2013-07-22 NOTE — ED Notes (Signed)
Patient transported to X-ray 

## 2013-07-22 NOTE — ED Notes (Signed)
Dr. Patel at bedside 

## 2013-07-22 NOTE — ED Notes (Signed)
Pt ambulated to restroom with no difficulty and stating that he "felt better than when he came in."

## 2013-07-22 NOTE — ED Notes (Signed)
Per EMS report: pt from home: pt c/o of dizziness upon standing.  EMS 12 lead unremarkable, CBG: 105.  Pt observed walking in hallway no difficulty. EMS reports orthostatics  were normal.  EMS reports hematoma on right butt cheek and was evaluated by PCP today.  Pt also got blood work done at PCP today but blood work has not resulted yetPt on Lovenox for pending endoscopy which got canceled d/t bruise.  Pt reports that something just doesn't feel right and wants to be evaluated.

## 2013-07-22 NOTE — ED Notes (Signed)
Bed: WA07 Expected date: 07/22/13 Expected time: 8:27 PM Means of arrival: Ambulance Comments: 67 yo M  General weakness

## 2013-07-22 NOTE — ED Provider Notes (Signed)
CSN: [id number]     Arrival date & time 07/22/13  2035 History   First MD Initiated Contact with Patient 07/22/13 2040     Chief Complaint  Patient presents with  . Weakness   (Consider location/radiation/quality/duration/timing/severity/associated sxs/prior Treatment) HPI  Jacob [name] is a 67 y.o. male past medical history significant for MI (CABG), hypertension, DVT, aortic aneurysm, prostate cancer, factor V laden and atrial fibrillation coming in today for feeling of anxiety, states he was scared and he has passed out from fright in the past, patient said that he was lying down on the couch got up to use the restroom and and felt nauseous and that he was going to syncopize. Patient was not having a bowel movement at the time he did not actually syncopized. Patient has been troubled with spells of nausea over the last few months and has had a recent 25 pound weight loss. Patient was scheduled to have a endoscopy at [facility] on Wednesday, this was postponed suicide secondary to a very large hematoma in the right buttock. Patient denies chest pain, shortness of breath, leg swelling, calf tenderness, calf asymmetry, recent immobilizations.   Past Medical History  Diagnosis Date  . Coronary artery disease   . Hypertension   . Factor 5 Leiden mutation, heterozygous   . DVT (deep venous thrombosis)   . Hemorrhoid   . Arthritis   . Cancer     . Skin cancer - basal cell. Forehead.  . Aortic aneurysm     be monitored   Past Surgical History  Procedure Laterality Date  . Prostate surgery    . Cardiac catheterization  2000  . Colonscopy       Polyps   . Skin cancer excision    . Coronary artery bypass graft  2000    2 vesssels  . Back surgery      Laminectomy fusion- --L 4/5  . Total hip arthroplasty Left 01/18/2013    Dr Cleophas Dunker  . Total hip arthroplasty Left 01/18/2013    Procedure: TOTAL HIP ARTHROPLASTY;  Surgeon: Valeria Batman, MD;  Location: [facility] OR;  Service: Orthopedics;   Laterality: Left;  Left Total Hip Arthroplasty   No family history on file. History  Substance Use Topics  . Smoking status: Former Smoker -- 15 years    Quit date: 11/24/1985  . Smokeless tobacco: Never Used     Comment: quit 25 years ago  . Alcohol Use: No   Review of Systems 10 systems reviewed and found to be negative, except as noted in the HPI  Allergies  Percocet  Home Medications   Current Outpatient Rx  Name  Route  Sig  Dispense  Refill  . Calcium-Magnesium-Vitamin D (CALCIUM 500 PO)   Oral   Take 2 tablets by mouth 2 (two) times daily.         . Cholecalciferol (VITAMIN D3) 5000 UNITS TABS   Oral   Take 1-2 tablets by mouth See admin instructions. Alternates 5000 to 10000 units every other day. Takes 5000 units every day then every other day takes 08657 units         . dutasteride (AVODART) 0.5 MG capsule   Oral   Take 0.5 mg by mouth daily.         Marland Kitchen enoxaparin (LOVENOX) 30 MG/0.3ML injection   Subcutaneous   Inject 0.3 mLs (30 mg total) into the skin every 12 (twelve) hours.   10 Syringe   0   . folic  acid (FOLVITE) 1 MG tablet   Oral   Take 1 mg by mouth daily.         Marland Kitchen HYDROmorphone (DILAUDID) 2 MG tablet   Oral   Take 0.5 tablets (1 mg total) by mouth every 4 (four) hours as needed.   90 tablet   0   . magnesium gluconate (MAGONATE) 500 MG tablet   Oral   Take 500 mg by mouth 2 (two) times daily.         Marland Kitchen MELATONIN PO   Oral   Take 1 tablet by mouth at bedtime as needed (sleep).         . metFORMIN (GLUCOPHAGE-XR) 750 MG 24 hr tablet   Oral   Take 750 mg by mouth daily with breakfast.         . methocarbamol (ROBAXIN) 500 MG tablet   Oral   Take 1 tablet (500 mg total) by mouth every 8 (eight) hours as needed (spasms).   30 tablet   0   . nebivolol (BYSTOLIC) 5 MG tablet   Oral   Take 5 mg by mouth 2 (two) times daily.         . ramipril (ALTACE) 2.5 MG capsule   Oral   Take 2.5 mg by mouth daily.         .  rosuvastatin (CRESTOR) 40 MG tablet   Oral   Take 40 mg by mouth daily.         Marland Kitchen warfarin (COUMADIN) 5 MG tablet   Oral   Take 2.5-5 mg by mouth See admin instructions. Takes 7.5 mg (1.5 tab)  on Monday, Wednesday and Friday and takes 5 mg (1 tab)on Sunday, Tuesday, Thursday, and saturday          BP 106/55  Pulse 87  Temp(Src) 98.8 F (37.1 C) (Oral)  Resp 18  SpO2 100% Physical Exam  Nursing note and vitals reviewed. Constitutional: He is oriented to person, place, and time. He appears well-developed and well-nourished. No distress.  HENT:  Head: Normocephalic.  Mouth/Throat: Oropharynx is clear and moist.  Eyes: Conjunctivae and EOM are normal. Pupils are equal, round, and reactive to light.  Neck: Normal range of motion. No JVD present.  Cardiovascular: Normal rate, regular rhythm and intact distal pulses.   Pulmonary/Chest: Effort normal and breath sounds normal. No stridor. No respiratory distress. He has no wheezes. He has no rales. He exhibits no tenderness.  Abdominal: Soft. Bowel sounds are normal. He exhibits no distension and no mass. There is no tenderness. There is no rebound and no guarding.  Musculoskeletal: Normal range of motion. He exhibits no edema and no tenderness.  Patient has calf asymmetry, left calf is greater than right. Patient has right-sided muscular atrophy from prior accident. No calf tenderness bilaterally  Neurological: He is alert and oriented to person, place, and time.  Skin:  Large left gluteal ecchymoses extending to the thigh and lumbar regions  Psychiatric: He has a normal mood and affect.    ED Course  Procedures (including critical care time) Labs Review Labs Reviewed  CBC WITH DIFFERENTIAL - Abnormal; Notable for the following:    WBC 3.8 (*)    RBC 3.47 (*)    Hemoglobin 9.4 (*)    HCT 28.6 (*)    RDW 18.5 (*)    Platelets 90 (*)    All other components within normal limits  BASIC METABOLIC PANEL - Abnormal; Notable for  the following:  Glucose, Bld 107 (*)    BUN 24 (*)    GFR calc non Af Amer 82 (*)    All other components within normal limits  HEPATIC FUNCTION PANEL - Abnormal; Notable for the following:    Total Protein 5.7 (*)    Albumin 3.4 (*)    All other components within normal limits  URINALYSIS, ROUTINE W REFLEX MICROSCOPIC  APTT  PROTIME-INR   Imaging Review Dg Chest 2 View  07/22/2013   *RADIOLOGY REPORT*  Clinical Data: Shortness of breath  CHEST - 2 VIEW  Comparison: 05/16/2013  Findings: Pain mild cardiac enlargement.  Aorta is uncoiled. Vascular pattern within normal limits with no infiltrate or consolidation.  No effusion.  Mild hyperinflation suggests COPD. Mid thoracic spine vertebral body moderate compression deformity, showing progressive compression when compared to prior study.  IMPRESSION: No acute cardiopulmonary process.  Subacute progressive compression deformity worse when compared to 05/16/2013.   Original Report Authenticated By: Esperanza Heir, M.D.     Date: 07/22/2013  Rate: 70  Rhythm: atrial fibrillation  QRS Axis: normal  Intervals: normal  ST/T Wave abnormalities: normal  Conduction Disutrbances:none  Narrative Interpretation:   Old EKG Reviewed: none available   MDM   1. Pre-syncope   2. Hematoma    Filed Vitals:   07/22/13 2045 07/22/13 2047 07/22/13 2048 07/22/13 2303  BP: 122/69 124/52 106/55 141/78  Pulse: 72 83 87   Temp: 98.8 F (37.1 C)     TempSrc: Oral     Resp: 18   19  SpO2: 100%   99%     Jacob [name] is a 67 y.o. male past medical history significant for MI (CABG), hypertension, DVT, aortic aneurysm, prostate cancer, factor V laden and atrial fibrillation presenting for presyncope. Patient has a large left gluteal ecchymoses which he states is improving. EKG shows atrial fibrillation, no ischemic changes. Troponin is negative. Patient is not significantly anemic, and chest x-ray shows no infiltrate or effusion.  This is a shared  visit with the attending physician who personally evaluated the patient and agrees with the care plan.  I have consulted [facility] hospital[facility] Dr. Allena Katz who will admit the patient.   Medications  ondansetron (ZOFRAN) injection 4 mg (not administered)   Note: Portions of this report may have been transcribed using voice recognition software. Every effort was made to ensure accuracy; however, inadvertent computerized transcription errors may be present      Wynetta Emery, PA-C 07/22/13 2346

## 2013-07-23 ENCOUNTER — Encounter (HOSPITAL_COMMUNITY): Payer: Self-pay | Admitting: *Deleted

## 2013-07-23 DIAGNOSIS — T148XXA Other injury of unspecified body region, initial encounter: Secondary | ICD-10-CM | POA: Diagnosis present

## 2013-07-23 DIAGNOSIS — R55 Syncope and collapse: Secondary | ICD-10-CM | POA: Diagnosis present

## 2013-07-23 LAB — BASIC METABOLIC PANEL
BUN: 23 mg/dL (ref 6–23)
Calcium: 8.9 mg/dL (ref 8.4–10.5)
Creatinine, Ser: 1.01 mg/dL (ref 0.50–1.35)
GFR calc Af Amer: 86 mL/min — ABNORMAL LOW (ref >90)
GFR calc non Af Amer: 74 mL/min — ABNORMAL LOW (ref >90)
Glucose, Bld: 98 mg/dL (ref 70–99)
Potassium: 3.4 mEq/L — ABNORMAL LOW (ref 3.5–5.1)

## 2013-07-23 LAB — CBC
MCH: 26.9 pg (ref 26.0–34.0)
MCHC: 32.5 g/dL (ref 30.0–36.0)
Platelets: 84 10*3/uL — ABNORMAL LOW (ref 150–400)
RDW: 18.5 % — ABNORMAL HIGH (ref 11.5–15.5)

## 2013-07-23 LAB — PROTIME-INR
INR: 1.14 (ref 0.00–1.49)
Prothrombin Time: 14.4 seconds (ref 11.6–15.2)

## 2013-07-23 MED ORDER — FUROSEMIDE 40 MG PO TABS
40.0000 mg | ORAL_TABLET | Freq: Every day | ORAL | Status: DC
  Administered 2013-07-23: 40 mg via ORAL
  Filled 2013-07-23: qty 1

## 2013-07-23 MED ORDER — RAMIPRIL 2.5 MG PO CAPS
2.5000 mg | ORAL_CAPSULE | Freq: Every day | ORAL | Status: DC
  Administered 2013-07-23: 11:00:00 2.5 mg via ORAL
  Filled 2013-07-23: qty 1

## 2013-07-23 MED ORDER — NEBIVOLOL HCL 5 MG PO TABS
5.0000 mg | ORAL_TABLET | Freq: Two times a day (BID) | ORAL | Status: DC
  Administered 2013-07-23 (×2): 5 mg via ORAL
  Filled 2013-07-23 (×4): qty 1

## 2013-07-23 MED ORDER — WARFARIN SODIUM 5 MG PO TABS: ORAL_TABLET | ORAL | Status: DC

## 2013-07-23 MED ORDER — DIPHENHYDRAMINE HCL 25 MG PO CAPS
25.0000 mg | ORAL_CAPSULE | Freq: Every evening | ORAL | Status: DC | PRN
  Administered 2013-07-23: 25 mg via ORAL
  Filled 2013-07-23: qty 1

## 2013-07-23 MED ORDER — FOLIC ACID 1 MG PO TABS
1.0000 mg | ORAL_TABLET | Freq: Every day | ORAL | Status: DC
  Administered 2013-07-23: 11:00:00 1 mg via ORAL
  Filled 2013-07-23: qty 1

## 2013-07-23 MED ORDER — ENOXAPARIN SODIUM 100 MG/ML ~~LOC~~ SOLN
90.0000 mg | Freq: Two times a day (BID) | SUBCUTANEOUS | Status: DC
  Filled 2013-07-23 (×3): qty 1

## 2013-07-23 MED ORDER — SODIUM CHLORIDE 0.9 % IJ SOLN
3.0000 mL | Freq: Two times a day (BID) | INTRAMUSCULAR | Status: DC
  Administered 2013-07-23: 3 mL via INTRAVENOUS

## 2013-07-23 MED ORDER — ACETAMINOPHEN 500 MG PO TABS
500.0000 mg | ORAL_TABLET | ORAL | Status: DC | PRN
  Administered 2013-07-23: 500 mg via ORAL
  Filled 2013-07-23: qty 1

## 2013-07-23 MED ORDER — FERROUS SULFATE 325 (65 FE) MG PO TABS: 325.0000 mg | ORAL_TABLET | Freq: Every day | ORAL | Status: DC

## 2013-07-23 MED ORDER — SODIUM CHLORIDE 0.9 % IJ SOLN: 3.0000 mL | Freq: Two times a day (BID) | INTRAMUSCULAR | Status: DC

## 2013-07-23 MED ORDER — SODIUM CHLORIDE 0.9 % IV SOLN: 250.0000 mL | INTRAVENOUS | Status: DC | PRN

## 2013-07-23 MED ORDER — DUTASTERIDE 0.5 MG PO CAPS
0.5000 mg | ORAL_CAPSULE | Freq: Every day | ORAL | Status: DC
Start: 2013-07-23 — End: 2013-07-23
  Administered 2013-07-23: 0.5 mg via ORAL
  Filled 2013-07-23: qty 1

## 2013-07-23 MED ORDER — SODIUM CHLORIDE 0.9 % IJ SOLN: 3.0000 mL | INTRAMUSCULAR | Status: DC | PRN

## 2013-07-23 NOTE — H&P (Signed)
[facility] History and Physical  Patient: Jacob [name]  ZOX:[id number]  DOB: 09/26/1946  DOA: 07/22/2013  Referring physician: Wynetta Emery, PA-C PCP: Pearla Dubonnet, MD  Chief Complaint: Dizziness  HPI: Jacob [name] is a 67 y.o. male with Past medical history of coronary artery disease status post CABG, hypertension, factor V Leyden deficiency heterozygous with history of DVT on Coumadin currently on hold due to right-sided hematoma, prostate cancer stable, atrial fibrillation. Patient mentioned he has been having significant weight loss which is unintentional since last 2 months. Has been being worked up as an outpatient. He was recently scheduled for an colonoscopy, he has seen his PCP this morning and there is no significant change in his medication. Today while patient was at home he felt uneasiness and then felt he has urge to urinate. While in the restroom he felt nauseous and that he was going to pass out. He called his wife for help who called EMS. Patient denied any complaint of palpitation, chest pain, shortness of breath, headache, dizziness, lightheadedness on my arrival.  Review of Systems: as mentioned in the history of present illness.  A Comprehensive review of the other systems is negative.  Past Medical History  Diagnosis Date  . Coronary artery disease   . Hypertension   . Factor 5 Leiden mutation, heterozygous   . DVT (deep venous thrombosis)   . Hemorrhoid   . Arthritis   . Cancer     . Skin cancer - basal cell. Forehead.  . Aortic aneurysm     be monitored   Past Surgical History  Procedure Laterality Date  . Prostate surgery    . Cardiac catheterization  2000  . Colonscopy       Polyps   . Skin cancer excision    . Coronary artery bypass graft  2000    2 vesssels  . Back surgery      Laminectomy fusion- --L 4/5  . Total hip arthroplasty Left 01/18/2013    Dr Cleophas Dunker  . Total hip arthroplasty Left 01/18/2013    Procedure: TOTAL  HIP ARTHROPLASTY;  Surgeon: Valeria Batman, MD;  Location: [facility] OR;  Service: Orthopedics;  Laterality: Left;  Left Total Hip Arthroplasty   Social History:  reports that he quit smoking about 27 years ago. He has never used smokeless tobacco. He reports that he does not drink alcohol or use illicit drugs. Patient is coming from home. Independent for most of his  ADL.  Allergies  Allergen Reactions  . Percocet [Oxycodone-Acetaminophen] Other (See Comments)    Paranoid    History reviewed. No pertinent family history.  Prior to Admission medications   Medication Sig Start Date End Date Taking? Authorizing Provider  Cholecalciferol (VITAMIN D3) 5000 UNITS TABS Take 1-2 tablets by mouth See admin instructions. Alternates 5000 to 10000 units every other day. Takes 5000 units every day then every other day takes 81191 units   Yes Historical Provider, MD  dutasteride (AVODART) 0.5 MG capsule Take 0.5 mg by mouth daily.   Yes Historical Provider, MD  folic acid (FOLVITE) 1 MG tablet Take 1 mg by mouth daily.   Yes Historical Provider, MD  furosemide (LASIX) 40 MG tablet Take 40 mg by mouth daily.   Yes Historical Provider, MD  nebivolol (BYSTOLIC) 5 MG tablet Take 5 mg by mouth 2 (two) times daily.   Yes Historical Provider, MD  ramipril (ALTACE) 2.5 MG capsule Take 2.5 mg by mouth daily.   Yes Historical Provider,  MD  warfarin (COUMADIN) 5 MG tablet Take 5-7.5 mg by mouth daily. Take 1 tablet daily except on Sundays take 1 1/2 tablet (7.5mg )   Yes Historical Provider, MD    Physical Exam: Filed Vitals:   07/22/13 2048 07/22/13 2303 07/23/13 0056 07/23/13 0101  BP: 106/55 141/78  136/64  Pulse: 87   71  Temp:    97.9 F (36.6 C)  TempSrc:    Oral  Resp:  19  18  Height:   6\' 3"  (1.905 m) 6\' 3"  (1.905 m)  Weight:   88.9 kg (195 lb 15.8 oz) 88.9 kg (195 lb 15.8 oz)  SpO2:  99%  100%    General: Alert, Awake and Oriented to Time, Place and Person. Appear in no distress Eyes:  PERRL ENT: Oral Mucosa clear moist. Neck: No JVD, no Carotid Bruits  Cardiovascular: S1 and S2 Present, aortic systolic Murmur, Peripheral Pulses Present Respiratory: Bilateral Air entry equal and Decreased, Clear to Auscultation,  No Crackles, no wheezes Abdomen: Bowel Sound Present, Soft and Non tender Skin: No Rash, presence of hematoma on the right Extremities: Trace Pedal edema, no calf tenderness Neurologic: Grossly Unremarkable.  Labs on Admission:  CBC:  Recent Labs Lab 07/22/13 2145  WBC 3.8*  NEUTROABS 2.3  HGB 9.4*  HCT 28.6*  MCV 82.4  PLT 90*    CMP     Component Value Date/Time   NA 138 07/22/2013 2145   K 3.5 07/22/2013 2145   CL 104 07/22/2013 2145   CO2 25 07/22/2013 2145   GLUCOSE 107* 07/22/2013 2145   BUN 24* 07/22/2013 2145   CREATININE 0.98 07/22/2013 2145   CALCIUM 9.1 07/22/2013 2145   PROT 5.7* 07/22/2013 2145   ALBUMIN 3.4* 07/22/2013 2145   AST 29 07/22/2013 2145   ALT 23 07/22/2013 2145   ALKPHOS 51 07/22/2013 2145   BILITOT 0.8 07/22/2013 2145   GFRNONAA 82* 07/22/2013 2145   GFRAA >90 07/22/2013 2145    No results found for this basename: LIPASE, AMYLASE,  in the last 168 hours No results found for this basename: AMMONIA,  in the last 168 hours  Cardiac Enzymes: No results found for this basename: CKTOTAL, CKMB, CKMBINDEX, TROPONINI,  in the last 168 hours  BNP (last 3 results) No results found for this basename: PROBNP,  in the last 8760 hours  Radiological Exams on Admission: Dg Chest 2 View  07/22/2013   *RADIOLOGY REPORT*  Clinical Data: Shortness of breath  CHEST - 2 VIEW  Comparison: 05/16/2013  Findings: Pain mild cardiac enlargement.  Aorta is uncoiled. Vascular pattern within normal limits with no infiltrate or consolidation.  No effusion.  Mild hyperinflation suggests COPD. Mid thoracic spine vertebral body moderate compression deformity, showing progressive compression when compared to prior study.  IMPRESSION: No acute cardiopulmonary  process.  Subacute progressive compression deformity worse when compared to 05/16/2013.   Original Report Authenticated By: Esperanza Heir, M.D.    EKG: Independently reviewed. atrial fibrillation, rate controlled.  Assessment/Plan Principal Problem:   Near syncope Active Problems:   Osteoarthritis of left hip   Neurologic cardiac syncope   Atrial fibrillation   Factor 5 Leiden mutation, heterozygous   Hematoma   1. Near syncope The patient does have history of atrial fibrillation. He has been currently worked up with [facility] for unidentified weight loss. He recently had large left calf hematoma and has been off his anticoagulation. He mentions the hematoma is getting better.  He presented with near syncope at  the time of urination, although he did not Initiated urination. His initial EKG does not show any significant abnormality other than atrial fibrillation, which appears rate controlled. Although he has a few prolonged RR intervals. He does not history of aortic stenosis, he does have history of aortic root dilatation which is mild as per his prior echo. He does not have any neurological complaints, chest pain, shortness of breath, tachycardia, hypertension, orthostatic hypotension, leg swelling. Considering the prolonged RR intervals there is a possibility of tachybradycardia syndrome causing his recurrent dizziness. I recommend the patient to be monitored on telemetry, to rule out any significant pause.  Patient admitted for observation.  2. Right-sided hematoma, history of factor V laden mutation, Patient has been on chronic anticoagulation, he was on warfarin, recently was switched to Lovenox for plan Endoscopy at [facility], then developed a hematoma on the right gluteal region and has been asked to stop taking his Lovenox.  Is not on any anticoagulation at present.  He mentions a hematoma is getting better and softer. His hemoglobin is stable. His INR is subtherapeutic. Currently I will  continue holding Lovenox and place the patient on sequential compression.  3. Osteoarthritis of the hip Tylenol PM as needed  4. BPH Continue home medication. Patient does not appear orthostatic.  DVT Prophylaxis: mechanical compression device Nutrition: As tolerated cardiac  Code Status: Full  Family Communication: Wife was at bedside and all questions were answered   Author: Lynden Oxford, MD [facility] Pager: [phone] 07/23/2013, 4:22 AM    If 7PM-7AM, please contact night-coverage [email or web address] Password [email or web address]

## 2013-07-23 NOTE — Discharge Summary (Signed)
Physician Discharge Summary   Patient ID: Jacob [name] [id number] 67 y.o. [birth year]  Admit date: 07/22/2013  Discharge date and time: No discharge date for patient encounter.   Admitting Physician: Lynden Oxford, MD   Discharge Physician: Dr Merlene Laughter  Admission Diagnoses: Hematoma [924.9] Pre-syncope [780.2]  Discharge Diagnoses: presyncope  Admission Condition: good  Discharged Condition: good  Indication for Admission: presyncope  Hospital Course: patient admitted for presyncope and cardiac monitor revealed afib rate controlled had 2 second pause without symptoms.  Recent buttocks hematoma and hgb 9 will start iron and out patient follow up  Consults: None  Significant Diagnostic Studies: labs: cardiac enzymes negative hgb 9  Treatments: observation  Discharge Exam: alert,  heart irregular rate without murmur  Disposition: 06-Home-Health Care Svc  Patient Instructions:    Medication List         dutasteride 0.5 MG capsule  Commonly known as:  AVODART  Take 0.5 mg by mouth daily.     ferrous sulfate 325 (65 FE) MG tablet  Commonly known as:  FERROUSUL  Take 1 tablet (325 mg total) by mouth daily with breakfast.     folic acid 1 MG tablet  Commonly known as:  FOLVITE  Take 1 mg by mouth daily.     furosemide 40 MG tablet  Commonly known as:  LASIX  Take 40 mg by mouth daily.     nebivolol 5 MG tablet  Commonly known as:  BYSTOLIC  Take 5 mg by mouth 2 (two) times daily.     ramipril 2.5 MG capsule  Commonly known as:  ALTACE  Take 2.5 mg by mouth daily.     Vitamin D3 5000 UNITS Tabs  Take 1-2 tablets by mouth See admin instructions. Alternates 5000 to 10000 units every other day. Takes 5000 units every day then every other day takes 40981 units     warfarin 5 MG tablet  Commonly known as:  COUMADIN  Take 5 mg daily except Sunday 1 and 1/2     warfarin 5 MG tablet  Commonly known as:  COUMADIN  Take 5-7.5 mg by mouth daily. Take 1  tablet daily except on Sundays take 1 1/2 tablet (7.5mg )       Activity: activity as tolerated Diet: cardiac diet Wound Care: none needed and will Call Dr Kevan Ny  Follow-up with Dr Kevan Ny in several weeks.  SignedGinette Otto 07/23/2013 9:59 AM

## 2013-08-30 ENCOUNTER — Other Ambulatory Visit: Payer: Self-pay | Admitting: Internal Medicine

## 2013-08-30 DIAGNOSIS — R109 Unspecified abdominal pain: Secondary | ICD-10-CM

## 2013-09-02 ENCOUNTER — Other Ambulatory Visit: Payer: Commercial Managed Care - PPO

## 2013-09-06 ENCOUNTER — Other Ambulatory Visit: Payer: Commercial Managed Care - PPO

## 2013-09-14 ENCOUNTER — Ambulatory Visit
Admission: RE | Admit: 2013-09-14 | Discharge: 2013-09-14 | Disposition: A | Discharge status: Home / Self Care | Payer: Commercial Managed Care - PPO | Source: Ambulatory Visit | Attending: Internal Medicine | Admitting: Internal Medicine

## 2013-09-14 DIAGNOSIS — R109 Unspecified abdominal pain: Secondary | ICD-10-CM

## 2013-09-14 MED ORDER — IOHEXOL 300 MG/ML  SOLN
100.0000 mL | Freq: Once | INTRAMUSCULAR | Status: AC | PRN
  Administered 2013-09-14: 100 mL via INTRAVENOUS

## 2013-09-20 ENCOUNTER — Other Ambulatory Visit: Payer: Self-pay | Admitting: Dermatology

## 2013-10-03 ENCOUNTER — Ambulatory Visit: Payer: Self-pay | Admitting: Podiatry

## 2013-10-12 ENCOUNTER — Ambulatory Visit (INDEPENDENT_AMBULATORY_CARE_PROVIDER_SITE_OTHER): Payer: Commercial Managed Care - PPO | Admitting: Podiatry

## 2013-10-12 DIAGNOSIS — M216X9 Other acquired deformities of unspecified foot: Secondary | ICD-10-CM

## 2013-10-12 NOTE — Progress Notes (Signed)
Subjective:     Patient ID: Jacob [name], male   DOB: [birth year], 67 y.o.   MRN: [id number]  HPI patient presents with irritated skin plantar right foot secondary to pedicure   Review of Systems     Objective:   Physical Exam  Nursing note and vitals reviewed. Constitutional: He is oriented to person, place, and time.  Cardiovascular: Intact distal pulses.   Musculoskeletal: Normal range of motion.  Neurological: He is oriented to person, place, and time.  Skin: Skin is warm.   irritated plantar tissue right secondary to trauma with discomfort noted neurovascular status intact with no other health history issues     Assessment:     Trauma to right foot creating pain    Plan:     H&P done and using sterile instrumentation removal of skin tags accomplished reappoint as needed

## 2013-10-12 NOTE — Progress Notes (Signed)
N-SORE L-RT FOOT D- 4 WEEKS O-SLOWLY C-WORSE A-PRESSURE T-TRIM

## 2013-10-12 NOTE — Progress Notes (Signed)
  Subjective:    Patient ID: Jacob [name], male    DOB: 08-Aug-1946, 67 y.o.   MRN: [id number]  HPI    Review of Systems  All other systems reviewed and are negative.       Objective:   Physical Exam        Assessment & Plan:

## 2013-10-13 ENCOUNTER — Other Ambulatory Visit: Payer: Self-pay | Admitting: Gastroenterology

## 2013-11-05 ENCOUNTER — Other Ambulatory Visit: Payer: Self-pay | Admitting: Interventional Cardiology

## 2013-11-28 DIAGNOSIS — S300XXA Contusion of lower back and pelvis, initial encounter: Secondary | ICD-10-CM | POA: Diagnosis not present

## 2013-11-28 DIAGNOSIS — M25559 Pain in unspecified hip: Secondary | ICD-10-CM | POA: Diagnosis not present

## 2013-11-29 DIAGNOSIS — R634 Abnormal weight loss: Secondary | ICD-10-CM | POA: Diagnosis not present

## 2013-11-29 DIAGNOSIS — R413 Other amnesia: Secondary | ICD-10-CM | POA: Diagnosis not present

## 2013-11-29 DIAGNOSIS — Z7901 Long term (current) use of anticoagulants: Secondary | ICD-10-CM | POA: Diagnosis not present

## 2013-11-29 DIAGNOSIS — C61 Malignant neoplasm of prostate: Secondary | ICD-10-CM | POA: Diagnosis not present

## 2013-11-29 DIAGNOSIS — I4891 Unspecified atrial fibrillation: Secondary | ICD-10-CM | POA: Diagnosis not present

## 2013-12-01 DIAGNOSIS — M25559 Pain in unspecified hip: Secondary | ICD-10-CM | POA: Diagnosis not present

## 2013-12-05 ENCOUNTER — Encounter (HOSPITAL_COMMUNITY): Payer: Self-pay | Admitting: Pharmacy Technician

## 2013-12-05 DIAGNOSIS — M25559 Pain in unspecified hip: Secondary | ICD-10-CM | POA: Diagnosis not present

## 2013-12-08 ENCOUNTER — Encounter (HOSPITAL_COMMUNITY): Payer: Self-pay | Admitting: *Deleted

## 2013-12-08 DIAGNOSIS — M25559 Pain in unspecified hip: Secondary | ICD-10-CM | POA: Diagnosis not present

## 2013-12-09 DIAGNOSIS — I4891 Unspecified atrial fibrillation: Secondary | ICD-10-CM | POA: Diagnosis not present

## 2013-12-09 DIAGNOSIS — E213 Hyperparathyroidism, unspecified: Secondary | ICD-10-CM | POA: Diagnosis not present

## 2013-12-09 DIAGNOSIS — I872 Venous insufficiency (chronic) (peripheral): Secondary | ICD-10-CM | POA: Diagnosis not present

## 2013-12-09 DIAGNOSIS — I2510 Atherosclerotic heart disease of native coronary artery without angina pectoris: Secondary | ICD-10-CM | POA: Diagnosis not present

## 2013-12-09 DIAGNOSIS — C775 Secondary and unspecified malignant neoplasm of intrapelvic lymph nodes: Secondary | ICD-10-CM | POA: Diagnosis not present

## 2013-12-09 DIAGNOSIS — E559 Vitamin D deficiency, unspecified: Secondary | ICD-10-CM | POA: Diagnosis not present

## 2013-12-09 DIAGNOSIS — C61 Malignant neoplasm of prostate: Secondary | ICD-10-CM | POA: Diagnosis not present

## 2013-12-09 DIAGNOSIS — C7951 Secondary malignant neoplasm of bone: Secondary | ICD-10-CM | POA: Diagnosis not present

## 2013-12-09 DIAGNOSIS — Z79899 Other long term (current) drug therapy: Secondary | ICD-10-CM | POA: Diagnosis not present

## 2013-12-09 DIAGNOSIS — D696 Thrombocytopenia, unspecified: Secondary | ICD-10-CM | POA: Diagnosis not present

## 2013-12-09 DIAGNOSIS — I519 Heart disease, unspecified: Secondary | ICD-10-CM | POA: Diagnosis not present

## 2013-12-09 DIAGNOSIS — E070 Hypersecretion of calcitonin: Secondary | ICD-10-CM | POA: Diagnosis not present

## 2013-12-09 DIAGNOSIS — E785 Hyperlipidemia, unspecified: Secondary | ICD-10-CM | POA: Diagnosis not present

## 2013-12-09 DIAGNOSIS — I5033 Acute on chronic diastolic (congestive) heart failure: Secondary | ICD-10-CM | POA: Diagnosis not present

## 2013-12-12 DIAGNOSIS — M25559 Pain in unspecified hip: Secondary | ICD-10-CM | POA: Diagnosis not present

## 2013-12-15 DIAGNOSIS — M25559 Pain in unspecified hip: Secondary | ICD-10-CM | POA: Diagnosis not present

## 2013-12-19 DIAGNOSIS — M25559 Pain in unspecified hip: Secondary | ICD-10-CM | POA: Diagnosis not present

## 2013-12-20 DIAGNOSIS — L821 Other seborrheic keratosis: Secondary | ICD-10-CM | POA: Diagnosis not present

## 2013-12-20 DIAGNOSIS — D485 Neoplasm of uncertain behavior of skin: Secondary | ICD-10-CM | POA: Diagnosis not present

## 2013-12-20 DIAGNOSIS — D239 Other benign neoplasm of skin, unspecified: Secondary | ICD-10-CM | POA: Diagnosis not present

## 2013-12-20 DIAGNOSIS — L723 Sebaceous cyst: Secondary | ICD-10-CM | POA: Diagnosis not present

## 2013-12-20 DIAGNOSIS — Z85828 Personal history of other malignant neoplasm of skin: Secondary | ICD-10-CM | POA: Diagnosis not present

## 2013-12-20 DIAGNOSIS — L820 Inflamed seborrheic keratosis: Secondary | ICD-10-CM | POA: Diagnosis not present

## 2013-12-22 DIAGNOSIS — M25559 Pain in unspecified hip: Secondary | ICD-10-CM | POA: Diagnosis not present

## 2013-12-27 ENCOUNTER — Encounter (HOSPITAL_COMMUNITY): Payer: Medicare Other | Admitting: Certified Registered Nurse Anesthetist

## 2013-12-27 ENCOUNTER — Encounter (HOSPITAL_COMMUNITY)
Admission: RE | Disposition: A | Discharge status: Home / Self Care | Payer: Self-pay | Source: Ambulatory Visit | Attending: Gastroenterology

## 2013-12-27 ENCOUNTER — Encounter (HOSPITAL_COMMUNITY): Payer: Self-pay

## 2013-12-27 ENCOUNTER — Ambulatory Visit (HOSPITAL_COMMUNITY): Payer: Medicare Other | Admitting: Certified Registered Nurse Anesthetist

## 2013-12-27 ENCOUNTER — Ambulatory Visit (HOSPITAL_COMMUNITY)
Admission: RE | Admit: 2013-12-27 | Discharge: 2013-12-27 | Disposition: A | Discharge status: Home / Self Care | Payer: Medicare Other | Source: Ambulatory Visit | Attending: Gastroenterology | Admitting: Gastroenterology

## 2013-12-27 DIAGNOSIS — Z1211 Encounter for screening for malignant neoplasm of colon: Secondary | ICD-10-CM | POA: Insufficient documentation

## 2013-12-27 DIAGNOSIS — Z85828 Personal history of other malignant neoplasm of skin: Secondary | ICD-10-CM | POA: Diagnosis not present

## 2013-12-27 DIAGNOSIS — E7800 Pure hypercholesterolemia, unspecified: Secondary | ICD-10-CM | POA: Diagnosis not present

## 2013-12-27 DIAGNOSIS — Z860100 Personal history of colon polyps, unspecified: Secondary | ICD-10-CM | POA: Insufficient documentation

## 2013-12-27 DIAGNOSIS — D696 Thrombocytopenia, unspecified: Secondary | ICD-10-CM | POA: Insufficient documentation

## 2013-12-27 DIAGNOSIS — I10 Essential (primary) hypertension: Secondary | ICD-10-CM | POA: Insufficient documentation

## 2013-12-27 DIAGNOSIS — M25559 Pain in unspecified hip: Secondary | ICD-10-CM | POA: Insufficient documentation

## 2013-12-27 DIAGNOSIS — I4891 Unspecified atrial fibrillation: Secondary | ICD-10-CM | POA: Diagnosis not present

## 2013-12-27 DIAGNOSIS — Z86718 Personal history of other venous thrombosis and embolism: Secondary | ICD-10-CM | POA: Diagnosis not present

## 2013-12-27 DIAGNOSIS — E041 Nontoxic single thyroid nodule: Secondary | ICD-10-CM | POA: Diagnosis not present

## 2013-12-27 DIAGNOSIS — J449 Chronic obstructive pulmonary disease, unspecified: Secondary | ICD-10-CM | POA: Insufficient documentation

## 2013-12-27 DIAGNOSIS — D6859 Other primary thrombophilia: Secondary | ICD-10-CM | POA: Insufficient documentation

## 2013-12-27 DIAGNOSIS — Y842 Radiological procedure and radiotherapy as the cause of abnormal reaction of the patient, or of later complication, without mention of misadventure at the time of the procedure: Secondary | ICD-10-CM | POA: Insufficient documentation

## 2013-12-27 DIAGNOSIS — D72819 Decreased white blood cell count, unspecified: Secondary | ICD-10-CM | POA: Diagnosis not present

## 2013-12-27 DIAGNOSIS — I2789 Other specified pulmonary heart diseases: Secondary | ICD-10-CM | POA: Insufficient documentation

## 2013-12-27 DIAGNOSIS — J4489 Other specified chronic obstructive pulmonary disease: Secondary | ICD-10-CM | POA: Insufficient documentation

## 2013-12-27 DIAGNOSIS — Z885 Allergy status to narcotic agent status: Secondary | ICD-10-CM | POA: Diagnosis not present

## 2013-12-27 DIAGNOSIS — I2510 Atherosclerotic heart disease of native coronary artery without angina pectoris: Secondary | ICD-10-CM | POA: Diagnosis not present

## 2013-12-27 DIAGNOSIS — I714 Abdominal aortic aneurysm, without rupture, unspecified: Secondary | ICD-10-CM | POA: Diagnosis not present

## 2013-12-27 DIAGNOSIS — Z8639 Personal history of other endocrine, nutritional and metabolic disease: Secondary | ICD-10-CM | POA: Insufficient documentation

## 2013-12-27 DIAGNOSIS — K6289 Other specified diseases of anus and rectum: Secondary | ICD-10-CM | POA: Diagnosis not present

## 2013-12-27 DIAGNOSIS — Z8546 Personal history of malignant neoplasm of prostate: Secondary | ICD-10-CM | POA: Insufficient documentation

## 2013-12-27 DIAGNOSIS — Z862 Personal history of diseases of the blood and blood-forming organs and certain disorders involving the immune mechanism: Secondary | ICD-10-CM | POA: Insufficient documentation

## 2013-12-27 DIAGNOSIS — E278 Other specified disorders of adrenal gland: Secondary | ICD-10-CM | POA: Diagnosis not present

## 2013-12-27 DIAGNOSIS — Z9079 Acquired absence of other genital organ(s): Secondary | ICD-10-CM | POA: Insufficient documentation

## 2013-12-27 DIAGNOSIS — Z888 Allergy status to other drugs, medicaments and biological substances status: Secondary | ICD-10-CM | POA: Diagnosis not present

## 2013-12-27 DIAGNOSIS — Z951 Presence of aortocoronary bypass graft: Secondary | ICD-10-CM | POA: Insufficient documentation

## 2013-12-27 HISTORY — PX: COLONOSCOPY WITH PROPOFOL: SHX5780

## 2013-12-27 HISTORY — DX: Personal history of irradiation: Z92.3

## 2013-12-27 SURGERY — COLONOSCOPY WITH PROPOFOL
Anesthesia: Monitor Anesthesia Care

## 2013-12-27 MED ORDER — ONDANSETRON HCL 4 MG/2ML IJ SOLN
INTRAMUSCULAR | Status: DC | PRN
  Administered 2013-12-27: 4 mg via INTRAVENOUS

## 2013-12-27 MED ORDER — ONDANSETRON HCL 4 MG/2ML IJ SOLN
INTRAMUSCULAR | Status: AC
  Filled 2013-12-27: qty 2

## 2013-12-27 MED ORDER — LACTATED RINGERS IV SOLN
INTRAVENOUS | Status: DC | PRN
  Administered 2013-12-27: 11:00:00 via INTRAVENOUS

## 2013-12-27 MED ORDER — PROPOFOL 10 MG/ML IV BOLUS
INTRAVENOUS | Status: AC
  Filled 2013-12-27: qty 40

## 2013-12-27 MED ORDER — LIDOCAINE HCL (CARDIAC) 20 MG/ML IV SOLN
INTRAVENOUS | Status: AC
Start: 2013-12-27 — End: 2013-12-27
  Filled 2013-12-27: qty 5

## 2013-12-27 MED ORDER — LIDOCAINE HCL (CARDIAC) 20 MG/ML IV SOLN
INTRAVENOUS | Status: DC | PRN
  Administered 2013-12-27: 80 mg via INTRAVENOUS

## 2013-12-27 MED ORDER — PROPOFOL INFUSION 10 MG/ML OPTIME
INTRAVENOUS | Status: DC | PRN
  Administered 2013-12-27: 50 ug/kg/min via INTRAVENOUS

## 2013-12-27 MED ORDER — PROPOFOL 10 MG/ML IV BOLUS
INTRAVENOUS | Status: DC | PRN
  Administered 2013-12-27 (×3): 20 mg via INTRAVENOUS

## 2013-12-27 SURGICAL SUPPLY — 21 items

## 2013-12-27 NOTE — Anesthesia Preprocedure Evaluation (Addendum)
Anesthesia Evaluation  Patient identified by MRN, date of birth, ID band Patient awake    Reviewed: Allergy & Precautions, H&P , NPO status , Patient's Chart, lab work & pertinent test results  Airway Mallampati: II TM Distance: >3 FB Neck ROM: Full    Dental  (+) Caps, Teeth Intact, Poor Dentition and Dental Advisory Given,    Pulmonary former smoker,  breath sounds clear to auscultation  Pulmonary exam normal       Cardiovascular hypertension, + CAD, + CABG, + Peripheral Vascular Disease, +CHF and DVT + dysrhythmias Atrial Fibrillation Rhythm:Irregular Rate:Normal  EKG: Afib with controlled ventricular response.   Neuro/Psych negative neurological ROS  negative psych ROS   GI/Hepatic negative GI ROS, Neg liver ROS,   Endo/Other  negative endocrine ROS  Renal/GU negative Renal ROS   Hx of prostate CA negative genitourinary   Musculoskeletal negative musculoskeletal ROS (+)   Abdominal   Peds  Hematology Factor 5 Lieden deficiency   Anesthesia Other Findings   Reproductive/Obstetrics negative OB ROS                         Anesthesia Physical Anesthesia Plan  ASA: III  Anesthesia Plan: MAC   Post-op Pain Management:    Induction: Intravenous  Airway Management Planned: Simple Face Mask  Additional Equipment:   Intra-op Plan:   Post-operative Plan:   Informed Consent: I have reviewed the patients History and Physical, chart, labs and discussed the procedure including the risks, benefits and alternatives for the proposed anesthesia with the patient or authorized representative who has indicated his/her understanding and acceptance.   Dental advisory given  Plan Discussed with: CRNA  Anesthesia Plan Comments:         Anesthesia Quick Evaluation

## 2013-12-27 NOTE — Anesthesia Postprocedure Evaluation (Signed)
Anesthesia Post Note  Patient: Jacob [name]  Procedure(s) Performed: Procedure(s) (LRB): COLONOSCOPY WITH PROPOFOL ([name])  Anesthesia type: General  Patient location: PACU  Post pain: Pain level controlled  Post assessment: Post-op Vital signs reviewed  Last Vitals:  Filed Vitals:   12/27/13 1200  BP: 105/59  Pulse:   Temp:   Resp: 17    Post vital signs: Reviewed  Level of consciousness: sedated  Complications: No apparent anesthesia complications

## 2013-12-27 NOTE — Preoperative (Signed)
Beta Blockers   Reason not to administer Beta Blockers:Not Applicable, took nebivolol 12/27/13 am.

## 2013-12-27 NOTE — Transfer of Care (Signed)
Immediate Anesthesia Transfer of Care Note  Patient: Jacob [name]  Procedure(s) Performed: Procedure(s) (LRB): COLONOSCOPY WITH PROPOFOL ([name])  Patient Location: PACU  Anesthesia Type: MAC  Level of Consciousness: sedated, patient cooperative and responds to stimulation  Airway & Oxygen Therapy: Patient Spontanous Breathing and Patient connected to face mask oxgen  Post-op Assessment: Report given to PACU RN and Post -op Vital signs reviewed and stable  Post vital signs: Reviewed and stable  Complications: No apparent anesthesia complications

## 2013-12-27 NOTE — H&P (Signed)
Procedure: Surveillance colonoscopy. Personal history of adenomatous colon polyps removed colonoscopically.  History: The patient is a 68 year old male born [birth year]. He has paroxysmal atrial fibrillation and takes Coumadin chronically. He has undergone colonoscopic exams in the past with removal of adenomatous colon polyps.  The patient is scheduled to undergo a surveillance colonoscopy today.  Past medical history: Leukopenia and thrombocytopenia. Paroxysmal atrial fibrillation. Coronary artery disease. Hypertension. Factor V Leiden deficiency complicated by deep venous thrombosis. Hypercholesterolemia. Gout. Prostate cancer. Radiation proctitis. Nonmelanoma skin cancer removal. Chronic obstructive pulmonary disease. Ascending aortic aneurysm. Pulmonary hypertension. Thyroid nodule. Bilateral adrenal nodules. Radical prostatectomy. Hemorrhoidectomy. Tonsillectomy. Lumbar laminectomy. Knee surgery. Coronary artery bypass grafting.  Medication allergies: Allopurinol. Dilated. Percocet. Lovenox.   Exam: The patient is alert and ambulatory. Lungs are clear to auscultation. Cardiac exam reveals a regular rhythm. Abdomen is soft and nontender to palpation.   Plan: Proceed with surveillance colonoscopy.

## 2013-12-27 NOTE — Discharge Instructions (Signed)
Colonoscopy °Care After °These instructions give you information on caring for yourself after your procedure. Your doctor may also give you more specific instructions. Call your doctor if you have any problems or questions after your procedure. °HOME CARE °· Take it easy for the next 24 hours. °· Rest. °· Walk or use warm packs on your belly (abdomen) if you have belly cramping or gas. °· Do not drive for 24 hours. °· You may shower. °· Do not sign important papers or use machinery for 24 hours. °· Drink enough fluids to keep your pee (urine) clear or pale yellow. °· Resume your normal diet. Avoid heavy or fried foods. °· Avoid alcohol. °· Continue taking your normal medicines. °· Only take medicine as told by your doctor. Do not take aspirin. °If you had growths (polyps) removed: °· Do not take aspirin. °· Do not drink alcohol for 7 days or as told by your doctor. °· Eat a soft diet for 24 hours. °GET HELP RIGHT AWAY IF: °· You have a fever. °· You pass clumps of tissue (blood clots) or fill the toilet with blood. °· You have belly pain that gets worse and medicine does not help. °· Your belly is puffy (swollen). °· You feel sick to your stomach (nauseous) or throw up (vomit). °MAKE SURE YOU: °· Understand these instructions. °· Will watch your condition. °· Will get help right away if you are not doing well or get worse. °Document Released: 12/13/2010 Document Revised: 02/02/2012 Document Reviewed: 07/18/2013 °Exit[employer]® Patient Information ©2014 [employer]. ° °

## 2013-12-27 NOTE — Op Note (Signed)
Procedure: Surveillance colonoscopy. Personal history of adenomatous colon polyps removed colonoscopically  Endoscopist: Earle Gell  Premedication: Propofol administered by anesthesia  Procedure: The patient was placed in the supine position. He could not lie on his left side due to hip pain from previous surgery. The Pentax pediatric colonoscope was introduced into the rectum and advanced to the cecum. A normal-appearing ileocecal valve and appendiceal orifice were identified. Colonic preparation for the exam today was good.  Rectum. Mild distal radiation proctitis. Otherwise normal rectal mucosa. Retroflexed view of the distal rectum could not be performed  Sigmoid colon and descending colon. Normal  Splenic flexure. Normal  Transverse colon. Normal  Hepatic flexure. Normal  Ascending colon. Normal  Cecum and ileocecal valve. Normal  Assessment:  #1. Mild distal radiation proctitis  #2. Normal surveillance proctocolonoscopy to the cecum  Recommendation: Schedule repeat surveillance colonoscopy in 5 years

## 2013-12-28 ENCOUNTER — Encounter (HOSPITAL_COMMUNITY): Payer: Self-pay | Admitting: Gastroenterology

## 2013-12-30 DIAGNOSIS — I4891 Unspecified atrial fibrillation: Secondary | ICD-10-CM | POA: Diagnosis not present

## 2013-12-30 DIAGNOSIS — Z7901 Long term (current) use of anticoagulants: Secondary | ICD-10-CM | POA: Diagnosis not present

## 2014-01-05 DIAGNOSIS — Z79899 Other long term (current) drug therapy: Secondary | ICD-10-CM | POA: Diagnosis not present

## 2014-01-05 DIAGNOSIS — C775 Secondary and unspecified malignant neoplasm of intrapelvic lymph nodes: Secondary | ICD-10-CM | POA: Diagnosis not present

## 2014-01-05 DIAGNOSIS — I2510 Atherosclerotic heart disease of native coronary artery without angina pectoris: Secondary | ICD-10-CM | POA: Diagnosis not present

## 2014-01-05 DIAGNOSIS — E213 Hyperparathyroidism, unspecified: Secondary | ICD-10-CM | POA: Diagnosis not present

## 2014-01-05 DIAGNOSIS — M25559 Pain in unspecified hip: Secondary | ICD-10-CM | POA: Diagnosis not present

## 2014-01-05 DIAGNOSIS — C61 Malignant neoplasm of prostate: Secondary | ICD-10-CM | POA: Diagnosis not present

## 2014-01-05 DIAGNOSIS — I4891 Unspecified atrial fibrillation: Secondary | ICD-10-CM | POA: Diagnosis not present

## 2014-01-05 DIAGNOSIS — E559 Vitamin D deficiency, unspecified: Secondary | ICD-10-CM | POA: Diagnosis not present

## 2014-01-05 DIAGNOSIS — C7951 Secondary malignant neoplasm of bone: Secondary | ICD-10-CM | POA: Diagnosis not present

## 2014-01-05 DIAGNOSIS — E785 Hyperlipidemia, unspecified: Secondary | ICD-10-CM | POA: Diagnosis not present

## 2014-01-05 DIAGNOSIS — C7952 Secondary malignant neoplasm of bone marrow: Secondary | ICD-10-CM | POA: Diagnosis not present

## 2014-01-06 DIAGNOSIS — I4891 Unspecified atrial fibrillation: Secondary | ICD-10-CM | POA: Diagnosis not present

## 2014-01-06 DIAGNOSIS — Z7901 Long term (current) use of anticoagulants: Secondary | ICD-10-CM | POA: Diagnosis not present

## 2014-01-09 DIAGNOSIS — M25559 Pain in unspecified hip: Secondary | ICD-10-CM | POA: Diagnosis not present

## 2014-01-13 DIAGNOSIS — I4891 Unspecified atrial fibrillation: Secondary | ICD-10-CM | POA: Diagnosis not present

## 2014-01-13 DIAGNOSIS — Z7901 Long term (current) use of anticoagulants: Secondary | ICD-10-CM | POA: Diagnosis not present

## 2014-01-18 DIAGNOSIS — M25559 Pain in unspecified hip: Secondary | ICD-10-CM | POA: Diagnosis not present

## 2014-01-18 DIAGNOSIS — M546 Pain in thoracic spine: Secondary | ICD-10-CM | POA: Diagnosis not present

## 2014-01-20 DIAGNOSIS — I4891 Unspecified atrial fibrillation: Secondary | ICD-10-CM | POA: Diagnosis not present

## 2014-01-20 DIAGNOSIS — C61 Malignant neoplasm of prostate: Secondary | ICD-10-CM | POA: Diagnosis not present

## 2014-01-20 DIAGNOSIS — Z79899 Other long term (current) drug therapy: Secondary | ICD-10-CM | POA: Diagnosis not present

## 2014-01-27 DIAGNOSIS — Z7901 Long term (current) use of anticoagulants: Secondary | ICD-10-CM | POA: Diagnosis not present

## 2014-01-27 DIAGNOSIS — I4891 Unspecified atrial fibrillation: Secondary | ICD-10-CM | POA: Diagnosis not present

## 2014-02-10 DIAGNOSIS — C61 Malignant neoplasm of prostate: Secondary | ICD-10-CM | POA: Diagnosis not present

## 2014-02-10 DIAGNOSIS — I4891 Unspecified atrial fibrillation: Secondary | ICD-10-CM | POA: Diagnosis not present

## 2014-02-10 DIAGNOSIS — Z79899 Other long term (current) drug therapy: Secondary | ICD-10-CM | POA: Diagnosis not present

## 2014-02-16 ENCOUNTER — Ambulatory Visit (INDEPENDENT_AMBULATORY_CARE_PROVIDER_SITE_OTHER): Payer: Medicare Other | Admitting: Podiatry

## 2014-02-16 ENCOUNTER — Encounter: Payer: Self-pay | Admitting: Podiatry

## 2014-02-16 VITALS — BP 106/65 | HR 53 | Resp 16

## 2014-02-16 DIAGNOSIS — M779 Enthesopathy, unspecified: Secondary | ICD-10-CM | POA: Diagnosis not present

## 2014-02-16 NOTE — Progress Notes (Signed)
Subjective:     Patient ID: Jacob [name], male   DOB: 09-20-1946, 68 y.o.   MRN: [id number]  HPI patient presents with chronic foot pain and having trouble with his left hip with a history of limb length discrepancy. States his feet get tired and sore   Review of Systems     Objective:   Physical Exam Neurovascular status unchanged with patient well oriented x3 and is found to have discomfort in the forefeet both feet and is using an approximate quarter inch heel lift on the right foot    Assessment:     Inflammation of the right and left forefoot with limb length discrepancy noted    Plan:     Scanned for new orthotics lifting the right heel within the orthotic itself and forefoot pad to reduce stress against the joint surfaces reappoint her recheck

## 2014-02-17 DIAGNOSIS — I4891 Unspecified atrial fibrillation: Secondary | ICD-10-CM | POA: Diagnosis not present

## 2014-02-17 DIAGNOSIS — Z7901 Long term (current) use of anticoagulants: Secondary | ICD-10-CM | POA: Diagnosis not present

## 2014-03-02 DIAGNOSIS — H25049 Posterior subcapsular polar age-related cataract, unspecified eye: Secondary | ICD-10-CM | POA: Diagnosis not present

## 2014-03-02 DIAGNOSIS — H3530 Unspecified macular degeneration: Secondary | ICD-10-CM | POA: Diagnosis not present

## 2014-03-02 DIAGNOSIS — H2510 Age-related nuclear cataract, unspecified eye: Secondary | ICD-10-CM | POA: Diagnosis not present

## 2014-03-02 DIAGNOSIS — H33309 Unspecified retinal break, unspecified eye: Secondary | ICD-10-CM | POA: Diagnosis not present

## 2014-03-06 ENCOUNTER — Ambulatory Visit (INDEPENDENT_AMBULATORY_CARE_PROVIDER_SITE_OTHER): Payer: Self-pay | Admitting: *Deleted

## 2014-03-06 DIAGNOSIS — M216X9 Other acquired deformities of unspecified foot: Secondary | ICD-10-CM

## 2014-03-06 NOTE — Progress Notes (Signed)
   Subjective:    Patient ID: Jacob [name], male    DOB: October 25, 1946, 68 y.o.   MRN: [id number]  HPI PICK UP ORTHOTICS AND GIVEN INSTRUCTION.    Review of Systems     Objective:   Physical Exam        Assessment & Plan:

## 2014-03-06 NOTE — Patient Instructions (Signed)

## 2014-03-07 ENCOUNTER — Telehealth: Payer: Self-pay | Admitting: *Deleted

## 2014-03-07 NOTE — Telephone Encounter (Signed)
Please call me.  I returned his call.  I been wearing the orthotics and they seem to be very comfortable.  I'm worried that I may have worn them too much but I'm going to start wearing them like it says to wear 1 hour first day then so on.  They've felt great until the night.  I told him it's okay to wear them as long as they are comfortable.  He said that's good then.

## 2014-03-16 DIAGNOSIS — H33309 Unspecified retinal break, unspecified eye: Secondary | ICD-10-CM | POA: Diagnosis not present

## 2014-03-17 DIAGNOSIS — Z7901 Long term (current) use of anticoagulants: Secondary | ICD-10-CM | POA: Diagnosis not present

## 2014-03-17 DIAGNOSIS — I4891 Unspecified atrial fibrillation: Secondary | ICD-10-CM | POA: Diagnosis not present

## 2014-03-20 ENCOUNTER — Encounter: Payer: Self-pay | Admitting: Podiatry

## 2014-03-20 ENCOUNTER — Ambulatory Visit (INDEPENDENT_AMBULATORY_CARE_PROVIDER_SITE_OTHER): Payer: Medicare Other | Admitting: Podiatry

## 2014-03-20 ENCOUNTER — Other Ambulatory Visit: Payer: Self-pay | Admitting: Dermatology

## 2014-03-20 VITALS — BP 104/61 | HR 45 | Resp 18

## 2014-03-20 DIAGNOSIS — L905 Scar conditions and fibrosis of skin: Secondary | ICD-10-CM | POA: Diagnosis not present

## 2014-03-20 DIAGNOSIS — Z85828 Personal history of other malignant neoplasm of skin: Secondary | ICD-10-CM | POA: Diagnosis not present

## 2014-03-20 DIAGNOSIS — L570 Actinic keratosis: Secondary | ICD-10-CM | POA: Diagnosis not present

## 2014-03-20 DIAGNOSIS — M7750 Other enthesopathy of unspecified foot: Secondary | ICD-10-CM | POA: Diagnosis not present

## 2014-03-20 DIAGNOSIS — D485 Neoplasm of uncertain behavior of skin: Secondary | ICD-10-CM | POA: Diagnosis not present

## 2014-03-20 DIAGNOSIS — L980 Pyogenic granuloma: Secondary | ICD-10-CM | POA: Diagnosis not present

## 2014-03-20 DIAGNOSIS — L820 Inflamed seborrheic keratosis: Secondary | ICD-10-CM | POA: Diagnosis not present

## 2014-03-20 DIAGNOSIS — D239 Other benign neoplasm of skin, unspecified: Secondary | ICD-10-CM | POA: Diagnosis not present

## 2014-03-20 NOTE — Progress Notes (Signed)
° °  Subjective:    Patient ID: Jacob [name], male    DOB: [birth year], 68 y.o.   MRN: [id number]  HPI my left insert is doing ok with the left foot and the right foot is shorter than the left and the right insert is higher than the left and the right needs to be lifted a little more    Review of Systems     Objective:   Physical Exam        Assessment & Plan:

## 2014-03-20 NOTE — Progress Notes (Signed)
Subjective:     Patient ID: Jacob [name], male   DOB: Mar 04, 1946, 68 y.o.   MRN: [id number]  HPI patient states that the right orthotics seems to be giving him a little bit of trouble and he is not sure of is quite high enough   Review of Systems     Objective:   Physical Exam Neurovascular status intact with diminishment of foot pain in general with the right orthotic difficult to measure as to whether or not it is not fitted properly    Assessment:     Tendinitis of a chronic condition with chronic lesions with patient doing well but may need adjustment of the right orthotic    Plan:     Reviewed condition with him and at this time we will allow it to recede for about 6-8 weeks and if not better we will have a new orthotic made for the right that will be higher

## 2014-03-23 ENCOUNTER — Ambulatory Visit: Payer: Medicare Other | Admitting: Podiatry

## 2014-03-30 DIAGNOSIS — C61 Malignant neoplasm of prostate: Secondary | ICD-10-CM | POA: Diagnosis not present

## 2014-03-30 DIAGNOSIS — M109 Gout, unspecified: Secondary | ICD-10-CM | POA: Diagnosis not present

## 2014-03-30 DIAGNOSIS — M25539 Pain in unspecified wrist: Secondary | ICD-10-CM | POA: Diagnosis not present

## 2014-03-31 DIAGNOSIS — C775 Secondary and unspecified malignant neoplasm of intrapelvic lymph nodes: Secondary | ICD-10-CM | POA: Diagnosis not present

## 2014-03-31 DIAGNOSIS — C61 Malignant neoplasm of prostate: Secondary | ICD-10-CM | POA: Diagnosis not present

## 2014-03-31 DIAGNOSIS — E559 Vitamin D deficiency, unspecified: Secondary | ICD-10-CM | POA: Diagnosis not present

## 2014-03-31 DIAGNOSIS — C7951 Secondary malignant neoplasm of bone: Secondary | ICD-10-CM | POA: Diagnosis not present

## 2014-03-31 DIAGNOSIS — I4891 Unspecified atrial fibrillation: Secondary | ICD-10-CM | POA: Diagnosis not present

## 2014-03-31 DIAGNOSIS — I2510 Atherosclerotic heart disease of native coronary artery without angina pectoris: Secondary | ICD-10-CM | POA: Diagnosis not present

## 2014-03-31 DIAGNOSIS — E213 Hyperparathyroidism, unspecified: Secondary | ICD-10-CM | POA: Diagnosis not present

## 2014-03-31 DIAGNOSIS — E785 Hyperlipidemia, unspecified: Secondary | ICD-10-CM | POA: Diagnosis not present

## 2014-03-31 DIAGNOSIS — Z79899 Other long term (current) drug therapy: Secondary | ICD-10-CM | POA: Diagnosis not present

## 2014-04-06 DIAGNOSIS — R7309 Other abnormal glucose: Secondary | ICD-10-CM | POA: Diagnosis not present

## 2014-04-27 DIAGNOSIS — E785 Hyperlipidemia, unspecified: Secondary | ICD-10-CM | POA: Diagnosis not present

## 2014-04-27 DIAGNOSIS — Z79899 Other long term (current) drug therapy: Secondary | ICD-10-CM | POA: Diagnosis not present

## 2014-04-27 DIAGNOSIS — I4891 Unspecified atrial fibrillation: Secondary | ICD-10-CM | POA: Diagnosis not present

## 2014-04-27 DIAGNOSIS — C7951 Secondary malignant neoplasm of bone: Secondary | ICD-10-CM | POA: Diagnosis not present

## 2014-04-27 DIAGNOSIS — C775 Secondary and unspecified malignant neoplasm of intrapelvic lymph nodes: Secondary | ICD-10-CM | POA: Diagnosis not present

## 2014-04-27 DIAGNOSIS — E559 Vitamin D deficiency, unspecified: Secondary | ICD-10-CM | POA: Diagnosis not present

## 2014-04-27 DIAGNOSIS — E213 Hyperparathyroidism, unspecified: Secondary | ICD-10-CM | POA: Diagnosis not present

## 2014-04-27 DIAGNOSIS — I2510 Atherosclerotic heart disease of native coronary artery without angina pectoris: Secondary | ICD-10-CM | POA: Diagnosis not present

## 2014-04-27 DIAGNOSIS — C61 Malignant neoplasm of prostate: Secondary | ICD-10-CM | POA: Diagnosis not present

## 2014-04-28 DIAGNOSIS — R7309 Other abnormal glucose: Secondary | ICD-10-CM | POA: Diagnosis not present

## 2014-04-28 DIAGNOSIS — Z6829 Body mass index (BMI) 29.0-29.9, adult: Secondary | ICD-10-CM | POA: Diagnosis not present

## 2014-04-28 DIAGNOSIS — Z7901 Long term (current) use of anticoagulants: Secondary | ICD-10-CM | POA: Diagnosis not present

## 2014-04-28 DIAGNOSIS — E669 Obesity, unspecified: Secondary | ICD-10-CM | POA: Diagnosis not present

## 2014-04-28 DIAGNOSIS — D696 Thrombocytopenia, unspecified: Secondary | ICD-10-CM | POA: Diagnosis not present

## 2014-05-01 DIAGNOSIS — L259 Unspecified contact dermatitis, unspecified cause: Secondary | ICD-10-CM | POA: Diagnosis not present

## 2014-05-01 DIAGNOSIS — Z85828 Personal history of other malignant neoplasm of skin: Secondary | ICD-10-CM | POA: Diagnosis not present

## 2014-05-18 DIAGNOSIS — Z79899 Other long term (current) drug therapy: Secondary | ICD-10-CM | POA: Diagnosis not present

## 2014-05-18 DIAGNOSIS — C61 Malignant neoplasm of prostate: Secondary | ICD-10-CM | POA: Diagnosis not present

## 2014-05-18 DIAGNOSIS — I4891 Unspecified atrial fibrillation: Secondary | ICD-10-CM | POA: Diagnosis not present

## 2014-05-25 DIAGNOSIS — I10 Essential (primary) hypertension: Secondary | ICD-10-CM

## 2014-05-25 DIAGNOSIS — J449 Chronic obstructive pulmonary disease, unspecified: Secondary | ICD-10-CM | POA: Insufficient documentation

## 2014-05-25 DIAGNOSIS — I2510 Atherosclerotic heart disease of native coronary artery without angina pectoris: Secondary | ICD-10-CM

## 2014-05-25 DIAGNOSIS — I272 Pulmonary hypertension, unspecified: Secondary | ICD-10-CM | POA: Insufficient documentation

## 2014-05-25 DIAGNOSIS — IMO0002 Reserved for concepts with insufficient information to code with codable children: Secondary | ICD-10-CM

## 2014-05-25 DIAGNOSIS — R160 Hepatomegaly, not elsewhere classified: Secondary | ICD-10-CM | POA: Insufficient documentation

## 2014-05-25 DIAGNOSIS — R739 Hyperglycemia, unspecified: Secondary | ICD-10-CM | POA: Insufficient documentation

## 2014-05-25 DIAGNOSIS — I25810 Atherosclerosis of coronary artery bypass graft(s) without angina pectoris: Secondary | ICD-10-CM | POA: Insufficient documentation

## 2014-05-29 ENCOUNTER — Encounter: Payer: Self-pay | Admitting: Interventional Cardiology

## 2014-05-29 ENCOUNTER — Ambulatory Visit (INDEPENDENT_AMBULATORY_CARE_PROVIDER_SITE_OTHER): Payer: Medicare Other | Admitting: Interventional Cardiology

## 2014-05-29 VITALS — BP 127/62 | HR 63 | Ht 75.0 in | Wt 229.0 lb

## 2014-05-29 DIAGNOSIS — I4891 Unspecified atrial fibrillation: Secondary | ICD-10-CM

## 2014-05-29 DIAGNOSIS — I2510 Atherosclerotic heart disease of native coronary artery without angina pectoris: Secondary | ICD-10-CM

## 2014-05-29 DIAGNOSIS — I10 Essential (primary) hypertension: Secondary | ICD-10-CM

## 2014-05-29 DIAGNOSIS — I712 Thoracic aortic aneurysm, without rupture, unspecified: Secondary | ICD-10-CM

## 2014-05-29 DIAGNOSIS — I7121 Aneurysm of the ascending aorta, without rupture: Secondary | ICD-10-CM

## 2014-05-29 DIAGNOSIS — I2789 Other specified pulmonary heart diseases: Secondary | ICD-10-CM | POA: Diagnosis not present

## 2014-05-29 DIAGNOSIS — I482 Chronic atrial fibrillation, unspecified: Secondary | ICD-10-CM

## 2014-05-29 DIAGNOSIS — I5032 Chronic diastolic (congestive) heart failure: Secondary | ICD-10-CM

## 2014-05-29 DIAGNOSIS — IMO0002 Reserved for concepts with insufficient information to code with codable children: Secondary | ICD-10-CM

## 2014-05-29 LAB — BASIC METABOLIC PANEL
BUN: 21 mg/dL (ref 6–23)
CALCIUM: 9.5 mg/dL (ref 8.4–10.5)
CO2: 29 meq/L (ref 19–32)
CREATININE: 1.1 mg/dL (ref 0.4–1.5)
Chloride: 106 mEq/L (ref 96–112)
GFR: 71.06 mL/min (ref >60.00)
Glucose, Bld: 105 mg/dL — ABNORMAL HIGH (ref 70–99)
Potassium: 4.1 mEq/L (ref 3.5–5.1)
Sodium: 141 mEq/L (ref 135–145)

## 2014-05-29 NOTE — Patient Instructions (Signed)
Your physician recommends that you continue on your current medications as directed. Please refer to the Current Medication list given to you today.  Lab today: [facility]  Non-Cardiac CT Angiography (CTA), is a special type of CT scan that uses a computer to produce multi-dimensional views of major blood vessels throughout the body. In CT angiography, a contrast material is injected through an IV to help visualize the blood vessels  Your physician wants you to follow-up in: 1 year You will receive a reminder letter in the mail two months in advance. If you don't receive a letter, please call our office to schedule the follow-up appointment.

## 2014-05-29 NOTE — Progress Notes (Signed)
Patient ID: DEVON PRETTY, male   DOB: 06-Oct-1946, 68 y.o.   MRN: [id number]    [address], [place]  [address] Phone: [phone] Fax:  [phone]  Date:  05/29/2014   ID:  GUILLAUME WENINGER, DOB 07-18-1946, MRN [id number]  PCP:  Henrine Screws, MD   ASSESSMENT:  1. Ascending aortic aneurysm, a symptom 2. Coronary artery disease, stable 3. Diastolic heart failure, stable/improved 4. Chronic atrial fibrillation, controlled rate without symptoms 5. Chronic anticoagulation, no bleeding 6. Unexplained weight loss that was concerning over the duration of much of the last year has completely resolved. His weight is increased by 34 pounds since February 2015. No explanation was found and I suspect that it was related to stress/depression  PLAN:  1. CT scan with contrast of the ascending aorta to rule out progression of aortic aneurysm 2. Aerobic exercise 3. Clinical followup in one year   SUBJECTIVE: KINNEY SACKMANN is a 68 y.o. male was doing well. He has gained weight. Lower extremity edema and abdominal pulsations have completely resolved. He has not had lightheadedness, dizziness, or dyspnea. He denies orthopnea. No episodes of syncope have occurred. His appetite is significantly improved. He is sleeping well. He denies depression but admits that he couldn't been stressed/depressed much of last year following left hip surgery that was complicated.   Wt Readings from Last 3 Encounters:  05/29/14 229 lb (103.874 kg)  12/27/13 195 lb (88.451 kg)  12/27/13 195 lb (88.451 kg)     Past Medical History  Diagnosis Date  . Coronary artery disease   . Hypertension   . Factor 5 Leiden mutation, heterozygous   . DVT (deep venous thrombosis) early 1980's    x 2  . Hemorrhoid   . Arthritis   . Aortic aneurysm     be monitored  . Unexplained weight loss 2014    25 pounds after hip replacement, has recently gained weight back  . Hx of radiation therapy 2013      TO PROSTATE, PRTON RADIATION  . Diastolic heart failure JUNE 2014    DR SMITH DIAGNOSED  . Cancer     . Skin cancer - basal cell. Forehead.  . Prostate cancer 2004    radiation 2006 [facility], HORMONAL [place] DONE    Current Outpatient Prescriptions  Medication Sig Dispense Refill  . celecoxib (CELEBREX) 200 MG capsule Take 200 mg by mouth 2 (two) times daily.      . chlorhexidine (PERIDEX) 0.12 % solution       . Cholecalciferol (VITAMIN D3) 5000 UNITS TABS Take 1-2 tablets by mouth See admin instructions. Alternates 5000 to 10000 units every other day. Takes 5000 units every day then every other day takes 10000 units      . colchicine 0.6 MG tablet Take 0.6 mg by mouth 2 (two) times daily as needed.      . dutasteride (AVODART) 0.5 MG capsule Take 0.5 mg by mouth daily with breakfast.       . escitalopram (LEXAPRO) 10 MG tablet Take 10 mg by mouth at bedtime.       . folic acid (FOLVITE) 1 MG tablet Take 1 mg by mouth daily.      . furosemide (LASIX) 40 MG tablet Take 40 mg by mouth daily.      Marland Kitchen GAVILYTE-N WITH FLAVOR PACK 420 G solution       . metFORMIN (GLUCOPHAGE) 500 MG tablet Take by mouth daily with  breakfast.      . methocarbamol (ROBAXIN) 500 MG tablet Take 500 mg by mouth every 6 (six) hours as needed for muscle spasms.       . nebivolol (BYSTOLIC) 5 MG tablet Take 5 mg by mouth 2 (two) times daily.      . ramipril (ALTACE) 2.5 MG capsule Take 2.5 mg by mouth daily with breakfast.       . warfarin (COUMADIN) 5 MG tablet Take 5-7.5 mg by mouth daily. Take 1 tablet daily except on Sundays take 1 1/2 tablet (7.5mg )       No current facility-administered medications for this visit.    Allergies:    Allergies  Allergen Reactions  . Percocet [Oxycodone-Acetaminophen] Other (See Comments)    Paranoid  . Allopurinol     Thrombocytopenia   . Dilaudid [Hydromorphone Hcl] Other (See Comments)    paranoid    Social History:  The patient  reports that he quit smoking about  28 years ago. He has never used smokeless tobacco. He reports that he does not drink alcohol or use illicit drugs.   ROS:  Please see the history of present illness.   Denies neurological complaints. No lightheadedness or dizziness.   All other systems reviewed and negative.   OBJECTIVE: VS:  BP 127/62  Pulse 63  Ht 6\' 3"  (1.905 m)  Wt 229 lb (103.874 kg)  BMI 28.62 kg/m2 Well nourished, well developed, in no acute distress, appears healthy and with good skin color HEENT: normal Neck: JVD flat even at 45. Carotid bruit absent  Cardiac:  normal S1, S2; IIRR; no murmur Lungs:  clear to auscultation bilaterally, no wheezing, rhonchi or rales Abd: soft, nontender, no hepatomegaly Ext: Edema trace bilateral. Pulses 2+ and symmetric Skin: warm and dry Neuro:  CNs 2-12 intact, no focal abnormalities noted  EKG:  Atrial fibrillation with controlled ventricular response       Signed, Illene Labrador III, MD 05/29/2014 9:49 AM

## 2014-05-31 ENCOUNTER — Telehealth: Payer: Self-pay

## 2014-05-31 NOTE — Telephone Encounter (Signed)
Message copied by Lamar Laundry on Wed May 31, 2014  3:46 PM ------      Message from: Daneen Schick      Created: Mon May 29, 2014  5:38 PM       [facility] for CT with contrast ------

## 2014-05-31 NOTE — Telephone Encounter (Signed)
pt aware of lab results.[place] for CT with contrast.pt verbalized understanding.

## 2014-06-01 ENCOUNTER — Ambulatory Visit (INDEPENDENT_AMBULATORY_CARE_PROVIDER_SITE_OTHER)
Admission: RE | Admit: 2014-06-01 | Discharge: 2014-06-01 | Disposition: A | Discharge status: Home / Self Care | Payer: Medicare Other | Source: Ambulatory Visit | Attending: Interventional Cardiology | Admitting: Interventional Cardiology

## 2014-06-01 DIAGNOSIS — I712 Thoracic aortic aneurysm, without rupture, unspecified: Secondary | ICD-10-CM | POA: Diagnosis not present

## 2014-06-01 DIAGNOSIS — D3500 Benign neoplasm of unspecified adrenal gland: Secondary | ICD-10-CM | POA: Diagnosis not present

## 2014-06-01 DIAGNOSIS — I7121 Aneurysm of the ascending aorta, without rupture: Secondary | ICD-10-CM

## 2014-06-01 MED ORDER — IOHEXOL 350 MG/ML SOLN
100.0000 mL | Freq: Once | INTRAVENOUS | Status: AC | PRN
  Administered 2014-06-01: 100 mL via INTRAVENOUS

## 2014-06-02 ENCOUNTER — Telehealth: Payer: Self-pay | Admitting: Interventional Cardiology

## 2014-06-02 NOTE — Telephone Encounter (Signed)
New message      Patient want CT results

## 2014-06-02 NOTE — Telephone Encounter (Signed)
Will route to Dr. Smith for review.  

## 2014-06-05 NOTE — Telephone Encounter (Signed)
pt adv ok to resume metformin it has been more than 48hr s since his ct w/contrast. pt given prelim ct results.adv pt we eill call back once results are reviewed by Dr.Smith.pt verbalized understanding.

## 2014-06-05 NOTE — Telephone Encounter (Signed)
F/u   Please call pt back at [phone].

## 2014-06-05 NOTE — Telephone Encounter (Signed)
New message  Pt called to follow up on CT results. Please call

## 2014-06-05 NOTE — Telephone Encounter (Signed)
Notified of CT scan results.  May resume Metformin 48 hrs after procedure.

## 2014-06-05 NOTE — Telephone Encounter (Signed)
attempted to call pt back on original call to pt  was disconnected ptcell phone had no service.

## 2014-06-05 NOTE — Telephone Encounter (Signed)
Follow up      Can pt resume taking metformin?  He had to stop to have a procedure

## 2014-06-09 DIAGNOSIS — Z5181 Encounter for therapeutic drug level monitoring: Secondary | ICD-10-CM | POA: Diagnosis not present

## 2014-06-09 DIAGNOSIS — Z7901 Long term (current) use of anticoagulants: Secondary | ICD-10-CM | POA: Diagnosis not present

## 2014-06-12 DIAGNOSIS — I2510 Atherosclerotic heart disease of native coronary artery without angina pectoris: Secondary | ICD-10-CM | POA: Diagnosis not present

## 2014-06-12 DIAGNOSIS — I4891 Unspecified atrial fibrillation: Secondary | ICD-10-CM | POA: Diagnosis not present

## 2014-06-12 DIAGNOSIS — C7951 Secondary malignant neoplasm of bone: Secondary | ICD-10-CM | POA: Diagnosis not present

## 2014-06-12 DIAGNOSIS — E213 Hyperparathyroidism, unspecified: Secondary | ICD-10-CM | POA: Diagnosis not present

## 2014-06-12 DIAGNOSIS — E559 Vitamin D deficiency, unspecified: Secondary | ICD-10-CM | POA: Diagnosis not present

## 2014-06-12 DIAGNOSIS — E785 Hyperlipidemia, unspecified: Secondary | ICD-10-CM | POA: Diagnosis not present

## 2014-06-12 DIAGNOSIS — M25539 Pain in unspecified wrist: Secondary | ICD-10-CM | POA: Diagnosis not present

## 2014-06-12 DIAGNOSIS — C775 Secondary and unspecified malignant neoplasm of intrapelvic lymph nodes: Secondary | ICD-10-CM | POA: Diagnosis not present

## 2014-06-12 DIAGNOSIS — C61 Malignant neoplasm of prostate: Secondary | ICD-10-CM | POA: Diagnosis not present

## 2014-06-12 DIAGNOSIS — Z79899 Other long term (current) drug therapy: Secondary | ICD-10-CM | POA: Diagnosis not present

## 2014-06-12 DIAGNOSIS — M109 Gout, unspecified: Secondary | ICD-10-CM | POA: Diagnosis not present

## 2014-06-19 DIAGNOSIS — D2370 Other benign neoplasm of skin of unspecified lower limb, including hip: Secondary | ICD-10-CM | POA: Diagnosis not present

## 2014-06-19 DIAGNOSIS — L821 Other seborrheic keratosis: Secondary | ICD-10-CM | POA: Diagnosis not present

## 2014-06-19 DIAGNOSIS — L723 Sebaceous cyst: Secondary | ICD-10-CM | POA: Diagnosis not present

## 2014-06-19 DIAGNOSIS — D239 Other benign neoplasm of skin, unspecified: Secondary | ICD-10-CM | POA: Diagnosis not present

## 2014-06-19 DIAGNOSIS — L570 Actinic keratosis: Secondary | ICD-10-CM | POA: Diagnosis not present

## 2014-06-19 DIAGNOSIS — D1801 Hemangioma of skin and subcutaneous tissue: Secondary | ICD-10-CM | POA: Diagnosis not present

## 2014-06-19 DIAGNOSIS — Z85828 Personal history of other malignant neoplasm of skin: Secondary | ICD-10-CM | POA: Diagnosis not present

## 2014-06-19 DIAGNOSIS — R21 Rash and other nonspecific skin eruption: Secondary | ICD-10-CM | POA: Diagnosis not present

## 2014-06-21 DIAGNOSIS — C61 Malignant neoplasm of prostate: Secondary | ICD-10-CM | POA: Diagnosis not present

## 2014-06-21 DIAGNOSIS — Z79899 Other long term (current) drug therapy: Secondary | ICD-10-CM | POA: Diagnosis not present

## 2014-06-23 ENCOUNTER — Telehealth: Payer: Self-pay | Admitting: *Deleted

## 2014-06-23 NOTE — Telephone Encounter (Signed)
"

## 2014-06-23 NOTE — Telephone Encounter (Signed)
I attempted to return his call, I left him a message to call me back.

## 2014-06-26 DIAGNOSIS — H33309 Unspecified retinal break, unspecified eye: Secondary | ICD-10-CM | POA: Diagnosis not present

## 2014-07-07 DIAGNOSIS — I4891 Unspecified atrial fibrillation: Secondary | ICD-10-CM | POA: Diagnosis not present

## 2014-07-07 DIAGNOSIS — Z7901 Long term (current) use of anticoagulants: Secondary | ICD-10-CM | POA: Diagnosis not present

## 2014-07-10 DIAGNOSIS — R300 Dysuria: Secondary | ICD-10-CM | POA: Diagnosis not present

## 2014-07-19 DIAGNOSIS — Z85828 Personal history of other malignant neoplasm of skin: Secondary | ICD-10-CM | POA: Diagnosis not present

## 2014-07-19 DIAGNOSIS — R21 Rash and other nonspecific skin eruption: Secondary | ICD-10-CM | POA: Diagnosis not present

## 2014-07-26 DIAGNOSIS — I4891 Unspecified atrial fibrillation: Secondary | ICD-10-CM | POA: Diagnosis not present

## 2014-07-26 DIAGNOSIS — Z79899 Other long term (current) drug therapy: Secondary | ICD-10-CM | POA: Diagnosis not present

## 2014-07-26 DIAGNOSIS — C61 Malignant neoplasm of prostate: Secondary | ICD-10-CM | POA: Diagnosis not present

## 2014-08-07 DIAGNOSIS — I2510 Atherosclerotic heart disease of native coronary artery without angina pectoris: Secondary | ICD-10-CM | POA: Diagnosis not present

## 2014-08-07 DIAGNOSIS — I4891 Unspecified atrial fibrillation: Secondary | ICD-10-CM | POA: Diagnosis not present

## 2014-08-07 DIAGNOSIS — Z7901 Long term (current) use of anticoagulants: Secondary | ICD-10-CM | POA: Diagnosis not present

## 2014-08-07 DIAGNOSIS — R7309 Other abnormal glucose: Secondary | ICD-10-CM | POA: Diagnosis not present

## 2014-08-07 DIAGNOSIS — E782 Mixed hyperlipidemia: Secondary | ICD-10-CM | POA: Diagnosis not present

## 2014-08-07 DIAGNOSIS — Z23 Encounter for immunization: Secondary | ICD-10-CM | POA: Diagnosis not present

## 2014-08-07 DIAGNOSIS — I10 Essential (primary) hypertension: Secondary | ICD-10-CM | POA: Diagnosis not present

## 2014-08-07 DIAGNOSIS — M109 Gout, unspecified: Secondary | ICD-10-CM | POA: Diagnosis not present

## 2014-08-07 DIAGNOSIS — Z1331 Encounter for screening for depression: Secondary | ICD-10-CM | POA: Diagnosis not present

## 2014-08-07 DIAGNOSIS — R300 Dysuria: Secondary | ICD-10-CM | POA: Diagnosis not present

## 2014-08-07 DIAGNOSIS — Z0000 Encounter for general adult medical examination without abnormal findings: Secondary | ICD-10-CM | POA: Diagnosis not present

## 2014-08-25 ENCOUNTER — Telehealth: Payer: Self-pay | Admitting: *Deleted

## 2014-08-25 NOTE — Telephone Encounter (Signed)
I can't remember the doctor's name.  He was relatively older.  If you guys could give me a call.  I need to speak to him about a toe, looks like the toenail may be dying.  Could you give me a call.

## 2014-08-28 ENCOUNTER — Ambulatory Visit: Payer: Medicare Other | Admitting: Podiatry

## 2014-08-28 ENCOUNTER — Encounter: Payer: Self-pay | Admitting: Podiatry

## 2014-08-28 VITALS — BP 126/53 | HR 57 | Resp 16

## 2014-08-28 DIAGNOSIS — I2510 Atherosclerotic heart disease of native coronary artery without angina pectoris: Secondary | ICD-10-CM | POA: Diagnosis not present

## 2014-08-28 DIAGNOSIS — B351 Tinea unguium: Secondary | ICD-10-CM | POA: Diagnosis not present

## 2014-08-28 NOTE — Progress Notes (Signed)
Subjective:     Patient ID: Jacob [name], male   DOB: [birth year], 68 y.o.   MRN: [id number]  HPI patient presents stating my left big toe has been loose and not sore but discolored   Review of Systems     Objective:   Physical Exam Neurovascular status intact with thick yellow brittle nailbeds and special thickness of the left hallux with looseness noted    Assessment:     Mycotic nail infection left big toe    Plan:     Explained to trauma is part of this and today start on formula 3 to try to reduce the fungal count and I think it's loose reappoint

## 2014-08-30 DIAGNOSIS — C61 Malignant neoplasm of prostate: Secondary | ICD-10-CM | POA: Diagnosis not present

## 2014-08-30 DIAGNOSIS — I4891 Unspecified atrial fibrillation: Secondary | ICD-10-CM | POA: Diagnosis not present

## 2014-08-31 NOTE — Telephone Encounter (Signed)
I called and he stated he had already been here.

## 2014-09-04 DIAGNOSIS — Z5181 Encounter for therapeutic drug level monitoring: Secondary | ICD-10-CM | POA: Diagnosis not present

## 2014-09-04 DIAGNOSIS — Z7901 Long term (current) use of anticoagulants: Secondary | ICD-10-CM | POA: Diagnosis not present

## 2014-09-14 DIAGNOSIS — I2510 Atherosclerotic heart disease of native coronary artery without angina pectoris: Secondary | ICD-10-CM | POA: Diagnosis not present

## 2014-09-14 DIAGNOSIS — C775 Secondary and unspecified malignant neoplasm of intrapelvic lymph nodes: Secondary | ICD-10-CM | POA: Diagnosis not present

## 2014-09-14 DIAGNOSIS — I10 Essential (primary) hypertension: Secondary | ICD-10-CM | POA: Diagnosis not present

## 2014-09-14 DIAGNOSIS — Z79899 Other long term (current) drug therapy: Secondary | ICD-10-CM | POA: Diagnosis not present

## 2014-09-14 DIAGNOSIS — I4891 Unspecified atrial fibrillation: Secondary | ICD-10-CM | POA: Diagnosis not present

## 2014-09-14 DIAGNOSIS — E785 Hyperlipidemia, unspecified: Secondary | ICD-10-CM | POA: Diagnosis not present

## 2014-09-14 DIAGNOSIS — C7951 Secondary malignant neoplasm of bone: Secondary | ICD-10-CM | POA: Diagnosis not present

## 2014-09-14 DIAGNOSIS — C61 Malignant neoplasm of prostate: Secondary | ICD-10-CM | POA: Diagnosis not present

## 2014-09-14 DIAGNOSIS — E559 Vitamin D deficiency, unspecified: Secondary | ICD-10-CM | POA: Diagnosis not present

## 2014-09-19 DIAGNOSIS — E785 Hyperlipidemia, unspecified: Secondary | ICD-10-CM | POA: Diagnosis not present

## 2014-09-25 DIAGNOSIS — R7309 Other abnormal glucose: Secondary | ICD-10-CM | POA: Diagnosis not present

## 2014-09-25 DIAGNOSIS — E785 Hyperlipidemia, unspecified: Secondary | ICD-10-CM | POA: Diagnosis not present

## 2014-09-27 DIAGNOSIS — C7951 Secondary malignant neoplasm of bone: Secondary | ICD-10-CM | POA: Diagnosis not present

## 2014-09-27 DIAGNOSIS — E070 Hypersecretion of calcitonin: Secondary | ICD-10-CM | POA: Diagnosis not present

## 2014-09-27 DIAGNOSIS — C61 Malignant neoplasm of prostate: Secondary | ICD-10-CM | POA: Diagnosis not present

## 2014-09-27 DIAGNOSIS — E785 Hyperlipidemia, unspecified: Secondary | ICD-10-CM | POA: Diagnosis not present

## 2014-09-27 DIAGNOSIS — D049 Carcinoma in situ of skin, unspecified: Secondary | ICD-10-CM | POA: Diagnosis not present

## 2014-09-27 DIAGNOSIS — D696 Thrombocytopenia, unspecified: Secondary | ICD-10-CM | POA: Diagnosis not present

## 2014-09-27 DIAGNOSIS — E291 Testicular hypofunction: Secondary | ICD-10-CM | POA: Diagnosis not present

## 2014-09-27 DIAGNOSIS — E213 Hyperparathyroidism, unspecified: Secondary | ICD-10-CM | POA: Diagnosis not present

## 2014-09-27 DIAGNOSIS — C775 Secondary and unspecified malignant neoplasm of intrapelvic lymph nodes: Secondary | ICD-10-CM | POA: Diagnosis not present

## 2014-10-04 DIAGNOSIS — M8580 Other specified disorders of bone density and structure, unspecified site: Secondary | ICD-10-CM | POA: Diagnosis not present

## 2014-10-05 DIAGNOSIS — Z7901 Long term (current) use of anticoagulants: Secondary | ICD-10-CM | POA: Diagnosis not present

## 2014-10-05 DIAGNOSIS — E785 Hyperlipidemia, unspecified: Secondary | ICD-10-CM | POA: Diagnosis not present

## 2014-10-09 ENCOUNTER — Other Ambulatory Visit: Payer: Self-pay | Admitting: Dermatology

## 2014-10-09 DIAGNOSIS — L821 Other seborrheic keratosis: Secondary | ICD-10-CM | POA: Diagnosis not present

## 2014-10-09 DIAGNOSIS — C44612 Basal cell carcinoma of skin of right upper limb, including shoulder: Secondary | ICD-10-CM | POA: Diagnosis not present

## 2014-10-09 DIAGNOSIS — D1801 Hemangioma of skin and subcutaneous tissue: Secondary | ICD-10-CM | POA: Diagnosis not present

## 2014-10-09 DIAGNOSIS — D485 Neoplasm of uncertain behavior of skin: Secondary | ICD-10-CM | POA: Diagnosis not present

## 2014-10-09 DIAGNOSIS — Z85828 Personal history of other malignant neoplasm of skin: Secondary | ICD-10-CM | POA: Diagnosis not present

## 2014-10-09 DIAGNOSIS — C44519 Basal cell carcinoma of skin of other part of trunk: Secondary | ICD-10-CM | POA: Diagnosis not present

## 2014-10-09 DIAGNOSIS — L570 Actinic keratosis: Secondary | ICD-10-CM | POA: Diagnosis not present

## 2014-11-02 DIAGNOSIS — Z7901 Long term (current) use of anticoagulants: Secondary | ICD-10-CM | POA: Diagnosis not present

## 2014-11-08 DIAGNOSIS — E785 Hyperlipidemia, unspecified: Secondary | ICD-10-CM | POA: Diagnosis not present

## 2014-11-08 DIAGNOSIS — Z79899 Other long term (current) drug therapy: Secondary | ICD-10-CM | POA: Diagnosis not present

## 2014-11-09 DIAGNOSIS — Z79899 Other long term (current) drug therapy: Secondary | ICD-10-CM | POA: Diagnosis not present

## 2014-11-09 DIAGNOSIS — E785 Hyperlipidemia, unspecified: Secondary | ICD-10-CM | POA: Diagnosis not present

## 2014-12-04 DIAGNOSIS — E785 Hyperlipidemia, unspecified: Secondary | ICD-10-CM | POA: Diagnosis not present

## 2014-12-04 DIAGNOSIS — C61 Malignant neoplasm of prostate: Secondary | ICD-10-CM | POA: Diagnosis not present

## 2014-12-04 DIAGNOSIS — I4891 Unspecified atrial fibrillation: Secondary | ICD-10-CM | POA: Diagnosis not present

## 2014-12-04 DIAGNOSIS — E559 Vitamin D deficiency, unspecified: Secondary | ICD-10-CM | POA: Diagnosis not present

## 2014-12-04 DIAGNOSIS — C7951 Secondary malignant neoplasm of bone: Secondary | ICD-10-CM | POA: Diagnosis not present

## 2014-12-04 DIAGNOSIS — I2510 Atherosclerotic heart disease of native coronary artery without angina pectoris: Secondary | ICD-10-CM | POA: Diagnosis not present

## 2014-12-04 DIAGNOSIS — C775 Secondary and unspecified malignant neoplasm of intrapelvic lymph nodes: Secondary | ICD-10-CM | POA: Diagnosis not present

## 2014-12-04 DIAGNOSIS — Z7901 Long term (current) use of anticoagulants: Secondary | ICD-10-CM | POA: Diagnosis not present

## 2014-12-04 DIAGNOSIS — Z79899 Other long term (current) drug therapy: Secondary | ICD-10-CM | POA: Diagnosis not present

## 2014-12-04 DIAGNOSIS — E213 Hyperparathyroidism, unspecified: Secondary | ICD-10-CM | POA: Diagnosis not present

## 2014-12-22 DIAGNOSIS — M109 Gout, unspecified: Secondary | ICD-10-CM | POA: Diagnosis not present

## 2015-01-06 DIAGNOSIS — H00014 Hordeolum externum left upper eyelid: Secondary | ICD-10-CM | POA: Diagnosis not present

## 2015-01-10 DIAGNOSIS — H0015 Chalazion left lower eyelid: Secondary | ICD-10-CM | POA: Diagnosis not present

## 2015-01-11 ENCOUNTER — Other Ambulatory Visit: Payer: Self-pay | Admitting: Dermatology

## 2015-01-11 DIAGNOSIS — Z85828 Personal history of other malignant neoplasm of skin: Secondary | ICD-10-CM | POA: Diagnosis not present

## 2015-01-11 DIAGNOSIS — C4441 Basal cell carcinoma of skin of scalp and neck: Secondary | ICD-10-CM | POA: Diagnosis not present

## 2015-01-11 DIAGNOSIS — L570 Actinic keratosis: Secondary | ICD-10-CM | POA: Diagnosis not present

## 2015-01-11 DIAGNOSIS — L821 Other seborrheic keratosis: Secondary | ICD-10-CM | POA: Diagnosis not present

## 2015-01-11 DIAGNOSIS — D1801 Hemangioma of skin and subcutaneous tissue: Secondary | ICD-10-CM | POA: Diagnosis not present

## 2015-01-11 DIAGNOSIS — D485 Neoplasm of uncertain behavior of skin: Secondary | ICD-10-CM | POA: Diagnosis not present

## 2015-01-11 DIAGNOSIS — L111 Transient acantholytic dermatosis [Grover]: Secondary | ICD-10-CM | POA: Diagnosis not present

## 2015-01-12 DIAGNOSIS — I2510 Atherosclerotic heart disease of native coronary artery without angina pectoris: Secondary | ICD-10-CM | POA: Diagnosis not present

## 2015-01-12 DIAGNOSIS — D831 Common variable immunodeficiency with predominant immunoregulatory T-cell disorders: Secondary | ICD-10-CM | POA: Diagnosis not present

## 2015-01-12 DIAGNOSIS — C7951 Secondary malignant neoplasm of bone: Secondary | ICD-10-CM | POA: Diagnosis not present

## 2015-01-12 DIAGNOSIS — C775 Secondary and unspecified malignant neoplasm of intrapelvic lymph nodes: Secondary | ICD-10-CM | POA: Diagnosis not present

## 2015-01-12 DIAGNOSIS — I10 Essential (primary) hypertension: Secondary | ICD-10-CM | POA: Diagnosis not present

## 2015-01-12 DIAGNOSIS — C61 Malignant neoplasm of prostate: Secondary | ICD-10-CM | POA: Diagnosis not present

## 2015-01-12 DIAGNOSIS — E213 Hyperparathyroidism, unspecified: Secondary | ICD-10-CM | POA: Diagnosis not present

## 2015-01-12 DIAGNOSIS — Z79899 Other long term (current) drug therapy: Secondary | ICD-10-CM | POA: Diagnosis not present

## 2015-01-12 DIAGNOSIS — E559 Vitamin D deficiency, unspecified: Secondary | ICD-10-CM | POA: Diagnosis not present

## 2015-01-12 DIAGNOSIS — E785 Hyperlipidemia, unspecified: Secondary | ICD-10-CM | POA: Diagnosis not present

## 2015-01-12 DIAGNOSIS — I4891 Unspecified atrial fibrillation: Secondary | ICD-10-CM | POA: Diagnosis not present

## 2015-01-15 DIAGNOSIS — H0015 Chalazion left lower eyelid: Secondary | ICD-10-CM | POA: Diagnosis not present

## 2015-01-17 DIAGNOSIS — H0015 Chalazion left lower eyelid: Secondary | ICD-10-CM | POA: Diagnosis not present

## 2015-02-06 ENCOUNTER — Telehealth: Payer: Self-pay | Admitting: Interventional Cardiology

## 2015-02-06 DIAGNOSIS — I10 Essential (primary) hypertension: Secondary | ICD-10-CM

## 2015-02-06 NOTE — Telephone Encounter (Signed)
Atenolol is not a good alternative. Carvedilol 3.125 mg po BID would be better and may have to be titrated.

## 2015-02-06 NOTE — Telephone Encounter (Signed)
New message         Pt would like to speak with nurse concerning medication change through [employer]

## 2015-02-06 NOTE — Telephone Encounter (Signed)
Returned pt call. Pt sts that Bystolic is no longer covered under his insurance plan. [employer] has suggested he switch to Atenolol Adv him Dr.Smith is not in the office. I will fwd a message to him and call back with his recommendation Pt verbalized understanding.

## 2015-02-07 MED ORDER — CARVEDILOL 3.125 MG PO TABS: 3.1250 mg | ORAL_TABLET | Freq: Two times a day (BID) | ORAL | Status: DC

## 2015-02-07 NOTE — Telephone Encounter (Signed)
Pt aware of Dr.Smith's recommendation   Atenolol is not a good alternative. Carvedilol 3.125 mg po BID would be better and may have to be titrated.  pt agreeable. Rx sent to pt pharmacy. Adv pt to ck bp 1-2 a wk to make sure his bp is being controlled. He verbalized understanding.

## 2015-02-08 DIAGNOSIS — M13132 Monoarthritis, not elsewhere classified, left wrist: Secondary | ICD-10-CM | POA: Diagnosis not present

## 2015-02-12 DIAGNOSIS — Z7901 Long term (current) use of anticoagulants: Secondary | ICD-10-CM | POA: Diagnosis not present

## 2015-02-20 ENCOUNTER — Other Ambulatory Visit: Payer: Self-pay | Admitting: Internal Medicine

## 2015-02-20 DIAGNOSIS — R310 Gross hematuria: Secondary | ICD-10-CM | POA: Diagnosis not present

## 2015-02-20 DIAGNOSIS — Z7901 Long term (current) use of anticoagulants: Secondary | ICD-10-CM | POA: Diagnosis not present

## 2015-02-21 ENCOUNTER — Ambulatory Visit
Admission: RE | Admit: 2015-02-21 | Discharge: 2015-02-21 | Disposition: A | Discharge status: Home / Self Care | Payer: Medicare Other | Source: Ambulatory Visit | Attending: Internal Medicine | Admitting: Internal Medicine

## 2015-02-21 DIAGNOSIS — R310 Gross hematuria: Secondary | ICD-10-CM

## 2015-02-22 DIAGNOSIS — C61 Malignant neoplasm of prostate: Secondary | ICD-10-CM | POA: Diagnosis not present

## 2015-02-22 DIAGNOSIS — R310 Gross hematuria: Secondary | ICD-10-CM | POA: Diagnosis not present

## 2015-02-26 DIAGNOSIS — Z8546 Personal history of malignant neoplasm of prostate: Secondary | ICD-10-CM | POA: Diagnosis not present

## 2015-02-26 DIAGNOSIS — Z7901 Long term (current) use of anticoagulants: Secondary | ICD-10-CM | POA: Diagnosis not present

## 2015-02-26 DIAGNOSIS — N132 Hydronephrosis with renal and ureteral calculous obstruction: Secondary | ICD-10-CM | POA: Diagnosis not present

## 2015-02-26 DIAGNOSIS — R310 Gross hematuria: Secondary | ICD-10-CM | POA: Diagnosis not present

## 2015-02-28 DIAGNOSIS — N201 Calculus of ureter: Secondary | ICD-10-CM | POA: Diagnosis not present

## 2015-03-01 ENCOUNTER — Other Ambulatory Visit: Payer: Self-pay | Admitting: Urology

## 2015-03-01 DIAGNOSIS — E119 Type 2 diabetes mellitus without complications: Secondary | ICD-10-CM | POA: Diagnosis not present

## 2015-03-01 DIAGNOSIS — H25043 Posterior subcapsular polar age-related cataract, bilateral: Secondary | ICD-10-CM | POA: Diagnosis not present

## 2015-03-01 DIAGNOSIS — H3531 Nonexudative age-related macular degeneration: Secondary | ICD-10-CM | POA: Diagnosis not present

## 2015-03-02 ENCOUNTER — Telehealth: Payer: Self-pay | Admitting: Interventional Cardiology

## 2015-03-02 NOTE — Telephone Encounter (Signed)
Returned pt call. Pt wants Dr.Smith to know that he is scheduled for a cystoscopy with retrograde on 4/13 with Dr.Grapey. Adv pt that I will fwd Dr.Smith an FYI and call back if he has any concerns from a cardiac standpoint. Pt verbalized understanding.

## 2015-03-02 NOTE — Telephone Encounter (Signed)
Follow Up        Pt calling to speak to [facility] about upcoming procedure. Please call back and advise.

## 2015-03-02 NOTE — Telephone Encounter (Signed)
New Message  Pt calling to  To speak w/ Rn about procedure he is having on 4/13. Please call back and discuss.

## 2015-03-05 ENCOUNTER — Encounter (HOSPITAL_BASED_OUTPATIENT_CLINIC_OR_DEPARTMENT_OTHER): Payer: Self-pay | Admitting: *Deleted

## 2015-03-05 NOTE — Progress Notes (Addendum)
NPO AFTER MN. ARRIVE AT 0177. NEEDS PT/INR, BMET, AND CBC.  CURRENT CHEST CT AND EKG  IN CHART AND EPIC. WILL TAKE CRESTOR AND COREG AM DOS W/ SIPS OF WATER.

## 2015-03-05 NOTE — Progress Notes (Signed)
   03/05/15 1047  OBSTRUCTIVE SLEEP APNEA  Have you ever been diagnosed with sleep apnea through a sleep study? No  Do you snore loudly (loud enough to be heard through closed doors)?  1  Do you often feel tired, fatigued, or sleepy during the daytime? 0  Has anyone observed you stop breathing during your sleep? 1  Do you have, or are you being treated for high blood pressure? 1  BMI more than 35 kg/m2? 0  Age over 71 years old? 1  Neck circumference greater than 40 cm/16 inches? 1  Gender: 1  Obstructive Sleep Apnea Score 6

## 2015-03-06 ENCOUNTER — Encounter (HOSPITAL_BASED_OUTPATIENT_CLINIC_OR_DEPARTMENT_OTHER): Payer: Self-pay | Admitting: *Deleted

## 2015-03-07 ENCOUNTER — Ambulatory Visit (HOSPITAL_BASED_OUTPATIENT_CLINIC_OR_DEPARTMENT_OTHER): Payer: Medicare Other | Admitting: Anesthesiology

## 2015-03-07 ENCOUNTER — Encounter (HOSPITAL_BASED_OUTPATIENT_CLINIC_OR_DEPARTMENT_OTHER)
Admission: RE | Disposition: A | Discharge status: Home / Self Care | Payer: Self-pay | Source: Ambulatory Visit | Attending: Urology

## 2015-03-07 ENCOUNTER — Encounter (HOSPITAL_BASED_OUTPATIENT_CLINIC_OR_DEPARTMENT_OTHER): Payer: Self-pay | Admitting: *Deleted

## 2015-03-07 ENCOUNTER — Ambulatory Visit (HOSPITAL_BASED_OUTPATIENT_CLINIC_OR_DEPARTMENT_OTHER)
Admission: RE | Admit: 2015-03-07 | Discharge: 2015-03-07 | Disposition: A | Discharge status: Home / Self Care | Payer: Medicare Other | Source: Ambulatory Visit | Attending: Urology | Admitting: Urology

## 2015-03-07 DIAGNOSIS — Z85828 Personal history of other malignant neoplasm of skin: Secondary | ICD-10-CM | POA: Diagnosis not present

## 2015-03-07 DIAGNOSIS — I714 Abdominal aortic aneurysm, without rupture: Secondary | ICD-10-CM | POA: Insufficient documentation

## 2015-03-07 DIAGNOSIS — Z951 Presence of aortocoronary bypass graft: Secondary | ICD-10-CM | POA: Diagnosis not present

## 2015-03-07 DIAGNOSIS — I4891 Unspecified atrial fibrillation: Secondary | ICD-10-CM | POA: Insufficient documentation

## 2015-03-07 DIAGNOSIS — Z96649 Presence of unspecified artificial hip joint: Secondary | ICD-10-CM | POA: Diagnosis not present

## 2015-03-07 DIAGNOSIS — J449 Chronic obstructive pulmonary disease, unspecified: Secondary | ICD-10-CM | POA: Insufficient documentation

## 2015-03-07 DIAGNOSIS — I10 Essential (primary) hypertension: Secondary | ICD-10-CM | POA: Diagnosis not present

## 2015-03-07 DIAGNOSIS — M109 Gout, unspecified: Secondary | ICD-10-CM | POA: Diagnosis not present

## 2015-03-07 DIAGNOSIS — I272 Other secondary pulmonary hypertension: Secondary | ICD-10-CM | POA: Insufficient documentation

## 2015-03-07 DIAGNOSIS — Z7901 Long term (current) use of anticoagulants: Secondary | ICD-10-CM | POA: Insufficient documentation

## 2015-03-07 DIAGNOSIS — E785 Hyperlipidemia, unspecified: Secondary | ICD-10-CM | POA: Diagnosis not present

## 2015-03-07 DIAGNOSIS — N201 Calculus of ureter: Secondary | ICD-10-CM | POA: Diagnosis not present

## 2015-03-07 DIAGNOSIS — Z885 Allergy status to narcotic agent status: Secondary | ICD-10-CM | POA: Diagnosis not present

## 2015-03-07 DIAGNOSIS — Z87891 Personal history of nicotine dependence: Secondary | ICD-10-CM | POA: Insufficient documentation

## 2015-03-07 DIAGNOSIS — Z8546 Personal history of malignant neoplasm of prostate: Secondary | ICD-10-CM | POA: Insufficient documentation

## 2015-03-07 DIAGNOSIS — R310 Gross hematuria: Secondary | ICD-10-CM | POA: Diagnosis not present

## 2015-03-07 DIAGNOSIS — Z86718 Personal history of other venous thrombosis and embolism: Secondary | ICD-10-CM | POA: Insufficient documentation

## 2015-03-07 DIAGNOSIS — M1990 Unspecified osteoarthritis, unspecified site: Secondary | ICD-10-CM | POA: Insufficient documentation

## 2015-03-07 DIAGNOSIS — Z79899 Other long term (current) drug therapy: Secondary | ICD-10-CM | POA: Insufficient documentation

## 2015-03-07 DIAGNOSIS — I2510 Atherosclerotic heart disease of native coronary artery without angina pectoris: Secondary | ICD-10-CM | POA: Insufficient documentation

## 2015-03-07 HISTORY — DX: Personal history of other diseases of the musculoskeletal system and connective tissue: Z87.39

## 2015-03-07 HISTORY — DX: Personal history of other venous thrombosis and embolism: Z86.718

## 2015-03-07 HISTORY — DX: Personal history of other malignant neoplasm of skin: Z98.890

## 2015-03-07 HISTORY — DX: Presence of aortocoronary bypass graft: Z95.1

## 2015-03-07 HISTORY — DX: Personal history of colonic polyps: Z86.010

## 2015-03-07 HISTORY — DX: Personal history of adenomatous and serrated colon polyps: Z86.0101

## 2015-03-07 HISTORY — DX: Nontoxic multinodular goiter: E04.2

## 2015-03-07 HISTORY — DX: Benign neoplasm of unspecified adrenal gland: D35.00

## 2015-03-07 HISTORY — DX: Gross hematuria: R31.0

## 2015-03-07 HISTORY — DX: Aneurysm of the ascending aorta, without rupture: I71.21

## 2015-03-07 HISTORY — DX: Prediabetes: R73.03

## 2015-03-07 HISTORY — DX: Other specified personal risk factors, not elsewhere classified: Z91.89

## 2015-03-07 HISTORY — DX: Long term (current) use of anticoagulants: Z79.01

## 2015-03-07 HISTORY — DX: Calculus of ureter: N20.1

## 2015-03-07 HISTORY — DX: Personal history of other malignant neoplasm of skin: Z85.828

## 2015-03-07 HISTORY — DX: Chronic diastolic (congestive) heart failure: I50.32

## 2015-03-07 HISTORY — PX: CYSTOSCOPY WITH RETROGRADE PYELOGRAM, URETEROSCOPY AND STENT PLACEMENT: SHX5789

## 2015-03-07 HISTORY — DX: Alcohol dependence, in remission: F10.21

## 2015-03-07 HISTORY — DX: Thoracic aortic aneurysm, without rupture: I71.2

## 2015-03-07 HISTORY — PX: HOLMIUM LASER APPLICATION: SHX5852

## 2015-03-07 HISTORY — DX: Unspecified osteoarthritis, unspecified site: M19.90

## 2015-03-07 HISTORY — DX: Reserved for concepts with insufficient information to code with codable children: IMO0002

## 2015-03-07 HISTORY — DX: Personal history of malignant neoplasm of prostate: Z85.46

## 2015-03-07 HISTORY — DX: Chronic atrial fibrillation, unspecified: I48.20

## 2015-03-07 LAB — GLUCOSE, CAPILLARY: Glucose-Capillary: 104 mg/dL — ABNORMAL HIGH (ref 70–99)

## 2015-03-07 LAB — CBC
HCT: 36.8 % — ABNORMAL LOW (ref 39.0–52.0)
Hemoglobin: 11.9 g/dL — ABNORMAL LOW (ref 13.0–17.0)
MCH: 28.1 pg (ref 26.0–34.0)
MCHC: 32.3 g/dL (ref 30.0–36.0)
MCV: 87 fL (ref 78.0–100.0)
Platelets: 151 10*3/uL (ref 150–400)
RBC: 4.23 MIL/uL (ref 4.22–5.81)
RDW: 14.5 % (ref 11.5–15.5)
WBC: 4.1 10*3/uL (ref 4.0–10.5)

## 2015-03-07 LAB — BASIC METABOLIC PANEL
Anion gap: 10 (ref 5–15)
BUN: 26 mg/dL — AB (ref 6–23)
CALCIUM: 9 mg/dL (ref 8.4–10.5)
CO2: 28 mmol/L (ref 19–32)
CREATININE: 1.66 mg/dL — AB (ref 0.50–1.35)
Chloride: 104 mmol/L (ref 96–112)
GFR, EST AFRICAN AMERICAN: 47 mL/min — AB (ref >90)
GFR, EST NON AFRICAN AMERICAN: 40 mL/min — AB (ref >90)
GLUCOSE: 112 mg/dL — AB (ref 70–99)
Potassium: 4 mmol/L (ref 3.5–5.1)
Sodium: 142 mmol/L (ref 135–145)

## 2015-03-07 LAB — PROTIME-INR
INR: 1.93 — ABNORMAL HIGH (ref 0.00–1.49)
Prothrombin Time: 22.2 seconds — ABNORMAL HIGH (ref 11.6–15.2)

## 2015-03-07 SURGERY — CYSTOURETEROSCOPY, WITH RETROGRADE PYELOGRAM AND STENT INSERTION
Anesthesia: General | Site: Ureter | Laterality: Left

## 2015-03-07 MED ORDER — CEFTRIAXONE SODIUM IN DEXTROSE 20 MG/ML IV SOLN
1.0000 g | INTRAVENOUS | Status: AC
  Administered 2015-03-07: 1 g via INTRAVENOUS
  Filled 2015-03-07: qty 50

## 2015-03-07 MED ORDER — FENTANYL CITRATE 0.05 MG/ML IJ SOLN
25.0000 ug | INTRAMUSCULAR | Status: DC | PRN
  Filled 2015-03-07: qty 1

## 2015-03-07 MED ORDER — LIDOCAINE HCL (CARDIAC) 20 MG/ML IV SOLN
INTRAVENOUS | Status: DC | PRN
  Administered 2015-03-07: 60 mg via INTRAVENOUS

## 2015-03-07 MED ORDER — CEFTRIAXONE SODIUM 1 G IJ SOLR
INTRAMUSCULAR | Status: AC
  Filled 2015-03-07: qty 10

## 2015-03-07 MED ORDER — LACTATED RINGERS IV SOLN
INTRAVENOUS | Status: DC
  Administered 2015-03-07: 09:00:00 via INTRAVENOUS
  Filled 2015-03-07: qty 1000

## 2015-03-07 MED ORDER — URELLE 81 MG PO TABS
ORAL_TABLET | ORAL | Status: AC
Start: 2015-03-07 — End: 2015-03-07
  Filled 2015-03-07: qty 1

## 2015-03-07 MED ORDER — SODIUM CHLORIDE 0.9 % IR SOLN
Status: DC | PRN
  Administered 2015-03-07: 4000 mL

## 2015-03-07 MED ORDER — FENTANYL CITRATE 0.05 MG/ML IJ SOLN
INTRAMUSCULAR | Status: DC | PRN
  Administered 2015-03-07: 25 ug via INTRAVENOUS
  Administered 2015-03-07: 50 ug via INTRAVENOUS
  Administered 2015-03-07 (×3): 25 ug via INTRAVENOUS

## 2015-03-07 MED ORDER — ONDANSETRON HCL 4 MG/2ML IJ SOLN
INTRAMUSCULAR | Status: DC | PRN
  Administered 2015-03-07: 4 mg via INTRAVENOUS

## 2015-03-07 MED ORDER — URELLE 81 MG PO TABS
1.0000 | ORAL_TABLET | Freq: Four times a day (QID) | ORAL | Status: DC
  Administered 2015-03-07: 81 mg via ORAL
  Filled 2015-03-07: qty 1

## 2015-03-07 MED ORDER — PHENYLEPHRINE HCL 10 MG/ML IJ SOLN
INTRAMUSCULAR | Status: DC | PRN
  Administered 2015-03-07 (×2): 40 ug via INTRAVENOUS

## 2015-03-07 MED ORDER — TRAMADOL HCL 50 MG PO TABS: 50.0000 mg | ORAL_TABLET | Freq: Four times a day (QID) | ORAL | Status: DC | PRN

## 2015-03-07 MED ORDER — ONDANSETRON HCL 4 MG/2ML IJ SOLN
4.0000 mg | Freq: Once | INTRAMUSCULAR | Status: DC | PRN
  Filled 2015-03-07: qty 2

## 2015-03-07 MED ORDER — MEPERIDINE HCL 25 MG/ML IJ SOLN
6.2500 mg | INTRAMUSCULAR | Status: DC | PRN
  Filled 2015-03-07: qty 1

## 2015-03-07 MED ORDER — PROPOFOL 10 MG/ML IV BOLUS
INTRAVENOUS | Status: DC | PRN
  Administered 2015-03-07: 150 mg via INTRAVENOUS

## 2015-03-07 MED ORDER — IOHEXOL 350 MG/ML SOLN
INTRAVENOUS | Status: DC | PRN
  Administered 2015-03-07: 5 mL

## 2015-03-07 MED ORDER — DEXAMETHASONE SODIUM PHOSPHATE 4 MG/ML IJ SOLN
INTRAMUSCULAR | Status: DC | PRN
  Administered 2015-03-07: 10 mg via INTRAVENOUS

## 2015-03-07 MED ORDER — FENTANYL CITRATE 0.05 MG/ML IJ SOLN
INTRAMUSCULAR | Status: AC
  Filled 2015-03-07: qty 4

## 2015-03-07 MED ORDER — URIBEL 118 MG PO CAPS: 1.0000 | ORAL_CAPSULE | Freq: Three times a day (TID) | ORAL | Status: DC | PRN

## 2015-03-07 MED ORDER — LIDOCAINE HCL 2 % EX GEL
CUTANEOUS | Status: DC | PRN
  Administered 2015-03-07: 1 via URETHRAL

## 2015-03-07 MED ORDER — EPHEDRINE SULFATE 50 MG/ML IJ SOLN
INTRAMUSCULAR | Status: DC | PRN
  Administered 2015-03-07 (×2): 10 mg via INTRAVENOUS

## 2015-03-07 MED ORDER — ACETAMINOPHEN 10 MG/ML IV SOLN
INTRAVENOUS | Status: DC | PRN
  Administered 2015-03-07: 1000 mg via INTRAVENOUS

## 2015-03-07 SURGICAL SUPPLY — 39 items
ADAPTER CATH URET PLST 4-6FR (CATHETERS) IMPLANT
BAG DRAIN URO-CYSTO SKYTR STRL (DRAIN) ×3 IMPLANT
BASKET LASER NITINOL 1.9FR (BASKET) IMPLANT
BASKET STNLS GEMINI 4WIRE 3FR (BASKET) IMPLANT
BASKET ZERO TIP NITINOL 2.4FR (BASKET) ×3 IMPLANT
BENZOIN TINCTURE PRP APPL 2/3 (GAUZE/BANDAGES/DRESSINGS) IMPLANT
CANISTER SUCT LVC 12 LTR MEDI- (MISCELLANEOUS) ×3 IMPLANT
CATH INTERMIT  6FR 70CM (CATHETERS) IMPLANT
CATH URET 5FR 28IN CONE TIP (BALLOONS)
CATH URET 5FR 28IN OPEN ENDED (CATHETERS) IMPLANT
CATH URET 5FR 70CM CONE TIP (BALLOONS) IMPLANT
CLOTH BEACON ORANGE TIMEOUT ST (SAFETY) ×3 IMPLANT
DRSG TEGADERM 2-3/8X2-3/4 SM (GAUZE/BANDAGES/DRESSINGS) IMPLANT
FIBER LASER FLEXIVA 1000 (UROLOGICAL SUPPLIES) IMPLANT
FIBER LASER FLEXIVA 200 (UROLOGICAL SUPPLIES) IMPLANT
FIBER LASER FLEXIVA 365 (UROLOGICAL SUPPLIES) IMPLANT
FIBER LASER FLEXIVA 550 (UROLOGICAL SUPPLIES) IMPLANT
GLOVE BIO SURGEON STRL SZ 6.5 (GLOVE) ×4 IMPLANT
GLOVE BIO SURGEON STRL SZ7.5 (GLOVE) ×3 IMPLANT
GLOVE BIO SURGEONS STRL SZ 6.5 (GLOVE) ×2
GLOVE BIOGEL PI IND STRL 6.5 (GLOVE) ×2 IMPLANT
GLOVE BIOGEL PI INDICATOR 6.5 (GLOVE) ×4
GOWN STRL REUS W/ TWL XL LVL3 (GOWN DISPOSABLE) ×1 IMPLANT
GOWN STRL REUS W/TWL LRG LVL3 (GOWN DISPOSABLE) ×6 IMPLANT
GOWN STRL REUS W/TWL XL LVL3 (GOWN DISPOSABLE) ×2
GUIDEWIRE 0.038 PTFE COATED (WIRE) IMPLANT
GUIDEWIRE ANG ZIPWIRE 038X150 (WIRE) IMPLANT
GUIDEWIRE STR DUAL SENSOR (WIRE) ×3 IMPLANT
IV NS 1000ML (IV SOLUTION) ×2
IV NS 1000ML BAXH (IV SOLUTION) ×1 IMPLANT
IV NS IRRIG 3000ML ARTHROMATIC (IV SOLUTION) ×3 IMPLANT
KIT BALLIN UROMAX 15FX10 (LABEL) IMPLANT
KIT BALLN UROMAX 15FX4 (MISCELLANEOUS) IMPLANT
KIT BALLN UROMAX 26 75X4 (MISCELLANEOUS)
NS IRRIG 500ML POUR BTL (IV SOLUTION) IMPLANT
PACK CYSTO (CUSTOM PROCEDURE TRAY) ×3 IMPLANT
SET HIGH PRES BAL DIL (LABEL)
SHEATH ACCESS URETERAL 38CM (SHEATH) ×3 IMPLANT
STENT CONTOUR 7FRX24 (STENTS) ×3 IMPLANT

## 2015-03-07 NOTE — Op Note (Signed)
Preoperative diagnosis: Multiple left proximal ureteral calculi Postoperative diagnosis: Same Procedure: Cystoscopy, left retrograde pyelography, ureteroscopy, holmium laser lithotripsy, double-J stent placement   Surgeon: Bernestine Amass M.D.  Anesthesia: Gen.  Indications: Jacob [name] is 69 years of age. He previously was cared for in our office with the diagnosis of prostate cancer. He currently has an undetectable PSA. He recently presented with gross hematuria. CT imaging revealed high-grade obstruction of the left kidney with a 13 mm proximal left ureteral stone and a second 5 mm more distal stone. The patient was in my office recently and was relatively asymptomatic. We felt that given the size of the stone there was very little chance of spontaneous passage. He did have significant hydronephrosis we felt it would be prudent to proceed with intervention sooner rather than later. The radiologist didn't feel significant obstruction was present. Patient is on chronic anticoagulation. He has a history of hypercoagulability secondary to factor V Leyden deficiency. We felt it was prudent if he could remain on his anticoagulation and asked him not to discontinue his Coumadin. We felt this would be a better option for him then stent placement with discontinuation of his anticoagulation and lithotripsy. He understands given the size of the stone as well as the fact that he is continuing on anticoagulation there is a higher likelihood that he will require a staged procedure and it is possible that we will not be able to deal with the entire stone burden on 1 session. The risks and benefits of ureteroscopy and holmium laser lithotripsy were discussed with him at length. He presents now for the procedure.     Technique and findings: Patient was brought the operating room where he had successful induction of general anesthesia. He was placed in lithotomy position and prepped and draped in usual manner. Appropriate  surgical timeout was performed. He is received perioperative antibiotics and placement of PAS compression boots. Cystoscopically the patient was noted to have a unremarkable urethra. The prostate was absent the bladder showed no significant pathology.. Retrograde pyelography on the left was accomplished and again findings consistent with CT imaging with large filling defect in the proximal ureter very high-grade obstruction. Guidewire was placed beyond the stone into the renal pelvis. The distal ureter was engaged with a flexible ureteroscopic laser sheath. This was advanced to just beneath the stone. The flexible ureteroscope was then inserted and the stone was encountered. We saw a very large stone embedded/impacted in the proximal ureter. A holmium laser lithotripter was utilized. A 0.8 J 8 Hz. The stone was very dense and took considerable time to fracture. Approximately 25-30 fragments were basket extracted. A larger fragment did migrate proximally and was trapped within a calyx. That piece was then fractured into an additional 10-15 pieces. The larger pieces were basket extracted. There was no evidence of any ureteral injury. The ureter proximally did appear to be inflamed and irritated from the embedded stone. Over the guidewire we placed a 7 French 26 cm double-J stent. Good positioning was confirmed. The patient had no obvious Occasions or problems and was brought to recovery room in stable condition.

## 2015-03-07 NOTE — Interval H&P Note (Signed)
History and Physical Interval Note:  03/07/2015 10:32 AM  Jacob [name]  has presented today for surgery, with the diagnosis of LEFT URETERAL CALCULUS, GROSS HEMATURIA  The various methods of treatment have been discussed with the patient and family. After consideration of risks, benefits and other options for treatment, the patient has consented to  Procedure(s): CYSTOSCOPY WITH RETROGRADE PYELOGRAM, URETEROSCOPY AND STENT PLACEMENT (Left) HOLMIUM LASER APPLICATION (Left) as a surgical intervention .  The patient's history has been reviewed, patient examined, no change in status, stable for surgery.  I have reviewed the patient's chart and labs.  Questions were answered to the patient's satisfaction.     [name]

## 2015-03-07 NOTE — H&P (Signed)
Reason For Visit   Mr Folmar presents today for followup and to go over recent findings of his CT scan. I have not seen him since 2012 and the paragraph below will outline that history. We were involved in his care more recently on a peripheral basis, giving him some temporary hormonal therapy. He has actually done extremely well from a prostate cancer standpoint and apparently has no evidence of disease at this time with an undetectable PSA off hormonal therapy. He recently experienced some painless total gross hematuria. He was evaluated approximately a week ago by Dr Lowella Bandy. He had no prior history of nephrolithiasis. Of note, he is on anticoagulation. Both atrial fibrillation as well apparent Leiden factor V deficiency and hypercoagulability. CT imaging revealed several stones in the proximal ureter. The largest was approximately 13 mm. There was moderate hydronephrosis. The renal parenchyma appeared relatively well preserved. Mr Peckham really has not had any abdominal or flank pain, which his quite surprising. It does suggest that potentially these stones have been obstructing for a long time, although again, his renal parenchyma appears otherwise unremarkable and he does not appear to have significant evidence of renal atrophy. Overall systemic renal function was also normal. He has not had any recent hematuria. Urinalysis today is unremarkable. He is here to discuss options.     History of Present Illness   Past Gu Hx:  Mr. Heinz presented to see me in 2012 to reestablish as a new patient and to reestablish a relationship with a local urologist. The patient's situation is fairly complicated and I have not seen him now since 2006. The patient underwent an open radical retropubic prostatectomy in March 2004. The patient had extensive tumor involvement in the right lobe of his prostate with the focal area of capsular extension. His PSA never went completely to undetectable levels, though were  generally in the 0.04 to 0.10 range. The patient elected to proceed with an attempt at salvage radiation therapy and was treated at [facility]. The patient's PSA really never responded and continued to be at a level of approximately 0.1 at the time of our last follow-up in July 2006. Mr. Macgowan subsequently never came back to see [place]. The patient has subsequently been under the care of a Dr. Vernona Rieger in [place]. This is a physician who has dedicated his practice to treating patients with prostate cancer. Mr. Premo was treated with hormonal therapy after apparently his PSA began to rise. Between June of 2008 and August of 2008, his PSA went from 0.3 to 1.1. Because of this rapid increase, the patient was started on total androgen ablation. His PSA subsequently came down and the patient is currently off his hormonal therapy with plans to reinitiate pending further assessment of his PSA. I do not have all of his data and really we are provided with just a single summary note from several years ago from Dr. Doyle Askew. Mr. Capuano purpose today was not to make [place] his managing physician but simply to be a conduit for initiation of Lupron when and if Dr. Olen Pel feels that he needs to go back on hormonal therapy. He is currently on a variety of supplements. He is having no voiding complaints and no other systemic issues at this time.     Past Medical History Problems  1. History of Arthritis 2. History of Atrial fibrillation (I48.91) 3. History of Disorder of heart rhythm (I49.9) 4. History of cardiac disorder (Z86.79) 5. History of gout (Z87.39) 6. History  of hyperlipidemia (Z86.39) 7. 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 History of Back Surgery 2. History of Coronary Artery Single Venous Bypass Graft 3. History of Heart Surgery 4. History of Prostatectomy, Retropubic 5. History of Total Hip Replacement  Current Meds 1. Atenolol 50  MG Oral Tablet;  Therapy: (Recorded:31Mar2016) to Recorded 2. Avodart 0.5 MG Oral Capsule;  Therapy: [id number] to Recorded 3. Carvedilol 3.125 MG Oral Tablet;  Therapy: (Recorded:31Mar2016) to Recorded 4. Colchicine 0.6 MG Oral Tablet;  Therapy: (Recorded:31Mar2016) to Recorded 5. Crestor 10 MG Oral Tablet;  Therapy: (Recorded:31Mar2016) to Recorded 6. Folic Acid 1 MG Oral Tablet;  Therapy: 29Mar2012 to Recorded 7. Furosemide 40 MG Oral Tablet;  Therapy: (Recorded:31Mar2016) to Recorded 8. Lexapro 10 MG Oral Tablet;  Therapy: (Recorded:31Mar2016) to Recorded 9. MetFORMIN HCl - 500 MG Oral Tablet;  Therapy: (Recorded:31Mar2016) to Recorded 10. Ramipril 2.5 MG Oral Capsule;   Therapy: [id number] to Recorded 11. Vitamin D TABS;   Therapy: (Recorded:16Aug2012) to Recorded 12. Warfarin Sodium 5 MG Oral Tablet;   Therapy: [id number] to Recorded  Allergies Medication  1. Allopurinol TABS 2. Crestor 3. Dilaudid TABS 4. Lovenox 5. Percocet TABS  Family History Problems  1. Family history of Death In The Family Father : Father   88yr 2. Family history of Death In The Family Mother : Mother   [id number] history of Family Health Status Number Of Children   1 son and 1 daughter 4. Family history of myocardial infarction (Z82.49) : Father  Social History Problems  1. Denied: History of Alcohol use 2. Denied: History of Alcohol Use 3. Caffeine use (F15.90) 4. Caffeine Use   3 qd 5. Father deceased 6.87Former smoker ([id number]. Marital History - Currently Married 8. 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  Genitourinary: hematuria.  Gastrointestinal: constipation, but no flank pain and no abdominal pain.    Vitals Vital Signs [Data Includes: Last 1 Day]  Recorded: 06Apr2016 09:49AM  Blood Pressure: 108 / 66 Temperature: 97.4 F Heart Rate: 64  Physical Exam Constitutional: Well nourished and well developed . No acute distress.  Neck: The appearance of the neck is normal and no neck mass is present.  Pulmonary: No respiratory distress and normal respiratory rhythm and effort.  The rhythm was irregularly irregular.  Abdomen: The abdomen is soft and nontender. No masses are palpated. No CVA tenderness. No hernias are palpable. No hepatosplenomegaly noted.  Skin: Normal skin turgor, no visible rash and no visible skin lesions.  Neuro/Psych:. Mood and affect are appropriate.    Results/Data Urine [Data Includes: Last 1 Day]   06Apr2016  COLOR YELLOW   APPEARANCE CLEAR   SPECIFIC GRAVITY 1.020   pH 6.0   GLUCOSE NEG mg/dL  BILIRUBIN NEG   KETONE TRACE mg/dL  BLOOD NEG   PROTEIN NEG mg/dL  UROBILINOGEN 0.2 mg/dL  NITRITE NEG   LEUKOCYTE ESTERASE NEG   Selected Results  UA With REFLEX 06Apr2016 09:21AM GrapRana SnareECIMEN TYPE: CLEAN CATCH   Test Name Result Flag Reference  COLOR YELLOW  YELLOW  APPEARANCE CLEAR  CLEAR  SPECIFIC GRAVITY 1.020  1.005-1.030  pH 6.0  5.0-8.0  GLUCOSE NEG mg/dL  NEG  BILIRUBIN NEG  NEG  KETONE TRACE mg/dL A NEG  BLOOD NEG  NEG  PROTEIN NEG mg/dL  NEG  UROBILINOGEN 0.2 mg/dL  0.0-1.0  NITRITE NEG  NEG  LEUKOCYTE ESTERASE NEG  NEG   AU CT-HEMATURIA PROTOCOL 04Apr2016 12:00AM Lowella Bandy   Test Name Result Flag Reference  AU CT-HEMATURIA PROTOCOL (Report)    ** RADIOLOGY REPORT BY [facility] **   CLINICAL DATA: 69 year old male with 2 day history of gross hematuria that occurred 10 days ago. History prostate cancer status post prostatectomy in 2004 follow by radiation therapy, proton therapy and  hormonal therapy.  EXAM: CT ABDOMEN AND PELVIS WITHOUT AND WITH CONTRAST  TECHNIQUE: Multidetector CT imaging of the abdomen and pelvis was performed following the standard protocol before and following the bolus administration of intravenous contrast.  CONTRAST: 125 mL of Isovue-300.  COMPARISON: CT of the abdomen and pelvis 09/14/2013.  FINDINGS: Lower chest: Cardiomegaly with biatrial dilatation. Atherosclerotic calcifications in the left anterior descending, left circumflex and right coronary arteries. Status post median sternotomy for CABG. Calcified granuloma in the right middle lobe.  Hepatobiliary: Severe atrophy of the left lobe of the liver is unchanged compared to numerous prior examinations. No cystic or solid hepatic lesions. No intra or extrahepatic biliary ductal dilatation. Gallbladder is unremarkable in appearance.  Pancreas: Unremarkable.  Spleen: Unremarkable.  Adrenals/Urinary Tract: Coronal image 66 of series 300 demonstrates 2 calculi in the region of the left ureteropelvic junction and proximal ureter. The most distal of these calculi measures 5 mm. The proximal calculus is much larger measuring 9 x 8 x 13 mm. This is associated with moderate left hydronephrosis and mild left perinephric stranding indicative of obstruction at this time. Additionally, no excretion of contrast material into the left renal collecting system is noted on the delayed images. 2 mm nonobstructive calculus in the upper pole collecting system of the left kidney. No calculi are identified within the right renal collecting system, along the course of the right ureter, or within the lumen of the urinary bladder. Post-contrast images demonstrate slightly diminished enhancement of the left kidney when compared with the right, but demonstrate no focal renal lesions in either kidney. Postcontrast delayed images demonstrate no filling defect within the right renal collecting system,  along the course of the right ureter, or within the lumen of the urinary bladder. As previously stated, the left renal collecting system and ureter do not opacify on delayed phase images. 12 mm right adrenal nodule is low-attenuation (8 HU), and similar to prior examinations, most compatible with an adenoma. Mild adreniform thickening of the left adrenal gland is unchanged.  Stomach/Bowel: The appearance of the stomach is normal. No pathologic dilatation of small bowel or colon.  Vascular/Lymphatic: Extensive atherosclerosis throughout the abdominal and pelvic vasculature, without evidence of aneurysm or dissection. No lymphadenopathy noted in the abdomen or pelvis.  Reproductive: Status post radical prostatectomy.  Other: No significant volume of ascites. No pneumoperitoneum.  Musculoskeletal: Status post left total hip arthroplasty. There are no aggressive appearing lytic or blastic lesions noted in the visualized portions of the skeleton. Old compression fracture of the superior endplate of L1 with approximately 20% loss of anterior vertebral body height, unchanged.  IMPRESSION: 1. Two calculi are present in the region of the left ureteropelvic junction and proximal ureter, largest of which measures 9 x 8 x 13 mm. These are associated with moderate left hydronephrosis and perinephric stranding, with a delayed left nephrogram,  and lack of excretion of contrast by the left kidney, all of which are indicative of significant obstruction at this time. 2. 2 mm nonobstructive calculus also noted in the upper pole collecting system of the left kidney. 3. Extensive atherosclerosis, including multivessel coronary artery disease. 4. Cardiomegaly with biatrial dilatation. 5. Additional incidental findings, as above.   Electronically Signed  By: Vinnie Langton M.D.  On: 02/26/2015 16:52   BUN & CREATININE [id number] 02:47PM Lowella Bandy  SPECIMEN TYPE: BLOOD   Test Name Result Flag  Reference  CREATININE 1.04 mg/dL  0.50-1.50  BUN 19 mg/dL  6-23  Est GFR, African American 84 mL/min    Est GFR, NonAfrican American 72 mL/min    THE ESTIMATED GFR IS A CALCULATION VALID FOR ADULTS (>=17 YEARS OLD) THAT USES THE CKD-EPI ALGORITHM TO ADJUST FOR AGE AND SEX. IT IS   NOT TO BE USED FOR CHILDREN, PREGNANT WOMEN, HOSPITALIZED PATIENTS,    PATIENTS ON DIALYSIS, OR WITH RAPIDLY CHANGING KIDNEY FUNCTION. ACCORDING TO THE NKDEP, EGFR >89 IS NORMAL, 60-89 SHOWS MILD IMPAIRMENT, 30-59 SHOWS MODERATE IMPAIRMENT, 15-29 SHOWS SEVERE IMPAIRMENT AND <15 IS ESRD.   Assessment Assessed  1. Ureteral calculus (N20.1)  Plan Health Maintenance  1. UA With REFLEX; [Do Not Release]; Status:Resulted - Requires Verification;   Done:  06Apr2016 09:21AM  Discussion/Summary   Mr Malena in essence had some intermittent, painless, gross hematuria. He has several large calculi at the left proximal ureter. I am surprised he has not had significant flank or abdominal discomfort. There appears to be at least moderate hydronephrosis. He did have an ultrasound prior to his CT, which showed no hydro, which again suggests that this stone probably has not been obstructing for a long period of time and I am surprised that he is not having discomfort associated with it. This stone has a very low likelihood of spontaneous passage. I have recommended intervention. There are several options, including ESWL with and without stent placement, or a ureteroscopic approach. He is on anticoagulation both for atrial fibrillation and more importantly for some hypercoagulability. If we can keep him on the blood thinner, that would be ideal. For that reason, I think he is better off undergoing cystoscopy with retrograde pyelogram and an attempt at ureteroscopy. If we are unable to access the stone, then we may just have to put a double-J stent in and treat him definitively at a later date with either a repeat attempt at  ureteroscopy or ESWL. If lithotripsy is performed, then he will need to bridge with Lovenox but, again, ideally we can try to get things done endoscopically and keep him on the anticoagulation. From a prostate cancer standpoint, things apparently appear to be quite encouraging. He continues to receive his care by Dr Doyle Askew in [place].   cc: Mertha Finders, MD   Signatures Electronically signed by : Rana Snare, M.D.; Feb 28 2015  1:14PM EST

## 2015-03-07 NOTE — Anesthesia Postprocedure Evaluation (Signed)
Anesthesia Post Note  Patient: Jacob [name]  Procedure(s) Performed: Procedure(s) (LRB): CYSTOSCOPY WITH RETROGRADE PYELOGRAM, URETEROSCOPY  WITH STONE EXTRACTION AND STENT PLACEMENT (Left) HOLMIUM LASER APPLICATION (Left)  Anesthesia type: general  Patient location: PACU  Post pain: Pain level controlled  Post assessment: Patient's Cardiovascular Status Stable  Last Vitals:  Filed Vitals:   03/07/15 1300  BP: 134/67  Pulse: 73  Temp:   Resp: 12    Post vital signs: Reviewed and stable  Level of consciousness: sedated  Complications: No apparent anesthesia complications

## 2015-03-07 NOTE — Discharge Instructions (Addendum)

## 2015-03-07 NOTE — Anesthesia Preprocedure Evaluation (Signed)
Anesthesia Evaluation  Patient identified by MRN, date of birth, ID band Patient awake    Reviewed: Allergy & Precautions, NPO status , Patient's Chart, lab work & pertinent test results  Airway Mallampati: I  TM Distance: >3 FB Neck ROM: Full    Dental   Pulmonary former smoker,    Pulmonary exam normal       Cardiovascular hypertension, Pt. on medications + CAD and + CABG     Neuro/Psych    GI/Hepatic   Endo/Other    Renal/GU      Musculoskeletal   Abdominal   Peds  Hematology   Anesthesia Other Findings   Reproductive/Obstetrics                             Anesthesia Physical Anesthesia Plan  ASA: III  Anesthesia Plan: General   Post-op Pain Management:    Induction: Intravenous  Airway Management Planned: LMA  Additional Equipment:   Intra-op Plan:   Post-operative Plan: Extubation in OR  Informed Consent: I have reviewed the patients History and Physical, chart, labs and discussed the procedure including the risks, benefits and alternatives for the proposed anesthesia with the patient or authorized representative who has indicated his/her understanding and acceptance.     Plan Discussed with: CRNA and Surgeon  Anesthesia Plan Comments:         Anesthesia Quick Evaluation

## 2015-03-07 NOTE — Transfer of Care (Signed)
  Last Vitals:  Filed Vitals:   03/07/15 0858  BP: 132/79  Pulse: 79  Temp: 36.5 C  Resp: 16    Immediate Anesthesia Transfer of Care Note  Patient: Jacob [name]  Procedure(s) Performed: Procedure(s) (LRB): CYSTOSCOPY WITH RETROGRADE PYELOGRAM, URETEROSCOPY  WITH STONE EXTRACTION AND STENT PLACEMENT (Left) HOLMIUM LASER APPLICATION (Left)  Patient Location: PACU  Anesthesia Type: General  Level of Consciousness: awake, alert  and oriented  Airway & Oxygen Therapy: Patient Spontanous Breathing and Patient connected to face mask oxygen  Post-op Assessment: Report given to PACU RN and Post -op Vital signs reviewed and stable  Post vital signs: Reviewed and stable  Complications: No apparent anesthesia complications

## 2015-03-07 NOTE — Anesthesia Procedure Notes (Signed)
Procedure Name: LMA Insertion Date/Time: 03/07/2015 11:49 AM Performed by: Mechele Claude Pre-anesthesia Checklist: Patient identified, Emergency Drugs available, Suction available and Patient being monitored Patient Re-evaluated:Patient Re-evaluated prior to inductionOxygen Delivery Method: Circle System Utilized Preoxygenation: Pre-oxygenation with 100% oxygen Intubation Type: IV induction Ventilation: Mask ventilation without difficulty LMA: LMA inserted LMA Size: 5.0 Number of attempts: 1 Airway Equipment and Method: bite block Placement Confirmation: positive ETCO2 Tube secured with: Tape Dental Injury: Teeth and Oropharynx as per pre-operative assessment

## 2015-03-08 ENCOUNTER — Encounter (HOSPITAL_BASED_OUTPATIENT_CLINIC_OR_DEPARTMENT_OTHER): Payer: Self-pay | Admitting: Urology

## 2015-03-09 ENCOUNTER — Telehealth: Payer: Self-pay | Admitting: Interventional Cardiology

## 2015-03-09 DIAGNOSIS — Z1389 Encounter for screening for other disorder: Secondary | ICD-10-CM | POA: Diagnosis not present

## 2015-03-09 DIAGNOSIS — E559 Vitamin D deficiency, unspecified: Secondary | ICD-10-CM | POA: Diagnosis not present

## 2015-03-09 DIAGNOSIS — R739 Hyperglycemia, unspecified: Secondary | ICD-10-CM | POA: Diagnosis not present

## 2015-03-09 DIAGNOSIS — Z7901 Long term (current) use of anticoagulants: Secondary | ICD-10-CM | POA: Diagnosis not present

## 2015-03-09 DIAGNOSIS — E782 Mixed hyperlipidemia: Secondary | ICD-10-CM | POA: Diagnosis not present

## 2015-03-09 DIAGNOSIS — I5032 Chronic diastolic (congestive) heart failure: Secondary | ICD-10-CM | POA: Diagnosis not present

## 2015-03-09 DIAGNOSIS — Z0001 Encounter for general adult medical examination with abnormal findings: Secondary | ICD-10-CM | POA: Diagnosis not present

## 2015-03-09 DIAGNOSIS — R310 Gross hematuria: Secondary | ICD-10-CM | POA: Diagnosis not present

## 2015-03-09 DIAGNOSIS — C61 Malignant neoplasm of prostate: Secondary | ICD-10-CM | POA: Diagnosis not present

## 2015-03-09 DIAGNOSIS — I2510 Atherosclerotic heart disease of native coronary artery without angina pectoris: Secondary | ICD-10-CM | POA: Diagnosis not present

## 2015-03-09 DIAGNOSIS — I10 Essential (primary) hypertension: Secondary | ICD-10-CM | POA: Diagnosis not present

## 2015-03-09 NOTE — Telephone Encounter (Signed)
New problem   Pt had kidney stone removed and pt want to know if he can get off Furosemide 40mg  tablets so he can heal properly.

## 2015-03-09 NOTE — Telephone Encounter (Signed)
Pt had kidney stones removed 4/13 (2 days ago) pt would like to know if he can stop taking the Furosemide medication for 1 or 2 days to help the urethra heal, because he has some bleeding. Pt would like to know Dr. Thompson Caul opinion about this.

## 2015-03-09 NOTE — Telephone Encounter (Signed)
Pt is aware that he can stop taking the Furosemide for no more then two day, because he may start having edema on his LE. Pt is aware he verbalized understanding.

## 2015-03-14 DIAGNOSIS — N201 Calculus of ureter: Secondary | ICD-10-CM | POA: Diagnosis not present

## 2015-03-14 DIAGNOSIS — R310 Gross hematuria: Secondary | ICD-10-CM | POA: Diagnosis not present

## 2015-03-22 DIAGNOSIS — C775 Secondary and unspecified malignant neoplasm of intrapelvic lymph nodes: Secondary | ICD-10-CM | POA: Diagnosis not present

## 2015-03-22 DIAGNOSIS — C7951 Secondary malignant neoplasm of bone: Secondary | ICD-10-CM | POA: Diagnosis not present

## 2015-03-22 DIAGNOSIS — C61 Malignant neoplasm of prostate: Secondary | ICD-10-CM | POA: Diagnosis not present

## 2015-03-22 DIAGNOSIS — I10 Essential (primary) hypertension: Secondary | ICD-10-CM | POA: Diagnosis not present

## 2015-03-22 DIAGNOSIS — D831 Common variable immunodeficiency with predominant immunoregulatory T-cell disorders: Secondary | ICD-10-CM | POA: Diagnosis not present

## 2015-03-22 DIAGNOSIS — E559 Vitamin D deficiency, unspecified: Secondary | ICD-10-CM | POA: Diagnosis not present

## 2015-03-22 DIAGNOSIS — Z7901 Long term (current) use of anticoagulants: Secondary | ICD-10-CM | POA: Diagnosis not present

## 2015-03-22 DIAGNOSIS — E785 Hyperlipidemia, unspecified: Secondary | ICD-10-CM | POA: Diagnosis not present

## 2015-03-22 DIAGNOSIS — E213 Hyperparathyroidism, unspecified: Secondary | ICD-10-CM | POA: Diagnosis not present

## 2015-03-22 DIAGNOSIS — I4891 Unspecified atrial fibrillation: Secondary | ICD-10-CM | POA: Diagnosis not present

## 2015-03-22 DIAGNOSIS — Z79899 Other long term (current) drug therapy: Secondary | ICD-10-CM | POA: Diagnosis not present

## 2015-03-22 DIAGNOSIS — I2510 Atherosclerotic heart disease of native coronary artery without angina pectoris: Secondary | ICD-10-CM | POA: Diagnosis not present

## 2015-03-28 ENCOUNTER — Telehealth: Payer: Self-pay | Admitting: Interventional Cardiology

## 2015-03-28 DIAGNOSIS — I712 Thoracic aortic aneurysm, without rupture: Secondary | ICD-10-CM

## 2015-03-28 DIAGNOSIS — I7121 Aneurysm of the ascending aorta, without rupture: Secondary | ICD-10-CM

## 2015-03-28 DIAGNOSIS — I5032 Chronic diastolic (congestive) heart failure: Secondary | ICD-10-CM

## 2015-03-28 NOTE — Telephone Encounter (Signed)
New Message  Pt wanted to clarify w/ RN if he will need to do fasting labs w/ his July appt w/ Dr. Tamala Julian. No orders in syst. Please call back and discuss.

## 2015-03-30 NOTE — Telephone Encounter (Signed)
Returned pt call. Adv him he would be due for fasitng lipid and alt. We could go ahead and schedule a lab appt prior to his f/u appt on 7/12 with Dr.Smith  Pt is wanting to schedule a CTA to f/u on his AAA Last yrs CT done in 05/2014 was stable. Adv pt I will fwd Dr.Smith a message for approval to order the CT, And then call the pt back to coordinate. Pt agreeable with plan and verbalized understanding.

## 2015-04-02 NOTE — Telephone Encounter (Signed)
He needs a 2-D Doppler echocardiogram done this year rather than a CT scan. This will decrease radiation exposure. The echocardiogram should be done to assess ascending aortic aneurysm size.

## 2015-04-03 NOTE — Telephone Encounter (Signed)
Pt aware of Dr.Smith's response. Pt  needs a 2-D Doppler echocardiogram done this year rather than a CT scan. This will decrease radiation exposure. The echocardiogram should be done to assess ascending aortic aneurysm size. Adv pt a scheduler from our office will call him to schedule his echo prior to his appt with Dr.Smith. Pt verbalized understanding.

## 2015-04-06 DIAGNOSIS — Z7901 Long term (current) use of anticoagulants: Secondary | ICD-10-CM | POA: Diagnosis not present

## 2015-05-03 ENCOUNTER — Encounter: Payer: Self-pay | Admitting: Podiatry

## 2015-05-03 ENCOUNTER — Ambulatory Visit (INDEPENDENT_AMBULATORY_CARE_PROVIDER_SITE_OTHER): Payer: Medicare Other

## 2015-05-03 ENCOUNTER — Ambulatory Visit (INDEPENDENT_AMBULATORY_CARE_PROVIDER_SITE_OTHER): Payer: Medicare Other | Admitting: Podiatry

## 2015-05-03 VITALS — Ht 75.0 in | Wt 216.0 lb

## 2015-05-03 DIAGNOSIS — Q667 Congenital pes cavus: Secondary | ICD-10-CM

## 2015-05-03 DIAGNOSIS — S99922A Unspecified injury of left foot, initial encounter: Secondary | ICD-10-CM | POA: Diagnosis not present

## 2015-05-03 DIAGNOSIS — M216X9 Other acquired deformities of unspecified foot: Secondary | ICD-10-CM

## 2015-05-03 NOTE — Progress Notes (Signed)
   Subjective:    Patient ID: Jacob [name], male    DOB: Apr 15, 1946, 69 y.o.   MRN: [id number]  HPI    Review of Systems  All other systems reviewed and are negative.      Objective:   Physical Exam        Assessment & Plan:

## 2015-05-04 NOTE — Progress Notes (Signed)
Subjective:     Patient ID: Jacob [name], male   DOB: 1946-05-05, 69 y.o.   MRN: [id number]  HPI patient presents stating that I traumatized my left fifth toe and am worried I broke it and that my left big toes give me some trouble and I wanted checked   Review of Systems  All other systems reviewed and are negative.      Objective:   Physical Exam  Cardiovascular: Intact distal pulses.   Skin: Skin is warm.  Nursing note and vitals reviewed.  neurovascular status found to be intact with muscle strength adequate range of motion within normal limits. There is mild discomfort left hallux but no abrasion no bruising and on the left fifth toe there is some distal abrasions and bruising and it is painful when pressed     Assessment:     Possible fracture of the left fifth digit and contusion injury present    Plan:     H&P and conditions reviewed and at this time I recommended padding therapy and soaks and wide-type shoes. Reappoint if symptoms persist

## 2015-05-07 ENCOUNTER — Telehealth: Payer: Self-pay | Admitting: *Deleted

## 2015-05-07 NOTE — Telephone Encounter (Addendum)
Left message informing pt I would call again tomorrow, I also encouraged pt to leave a brief message about his concern and often I can answer or consult with Dr. Paulla Dolly and call him with an answer.  I was unable to understand pt while on the phone and he seemed to be asking when he could take the band off his toe.  I told pt to come in on 05/09/2015 and we would discuss the band.  Pt agreed.

## 2015-05-08 DIAGNOSIS — Z7901 Long term (current) use of anticoagulants: Secondary | ICD-10-CM | POA: Diagnosis not present

## 2015-05-08 NOTE — Telephone Encounter (Addendum)
-----   Message from [facility] sent at 05/08/2015  3:00 PM EDT ----- Regarding: PT CALLED BACK Contact: [phone] PT CALLED BACK AGAIN AND WOULD LIKE YOU TO CALL HIM BACK PLEASE ABOUT WHEN HE CAN TAKE THE BAND ON HIS TOE OFF.  Pt presented to office, asked how long to wear his band.  Pt was wearing a silicone digital pad on the left 1st toe.  I told pt to wear the silicone digital pad when wearing his shoes and gave him a silicone digital cap to wear for comfort as well.

## 2015-05-10 DIAGNOSIS — E785 Hyperlipidemia, unspecified: Secondary | ICD-10-CM | POA: Diagnosis not present

## 2015-05-10 DIAGNOSIS — D699 Hemorrhagic condition, unspecified: Secondary | ICD-10-CM | POA: Diagnosis not present

## 2015-05-10 DIAGNOSIS — C775 Secondary and unspecified malignant neoplasm of intrapelvic lymph nodes: Secondary | ICD-10-CM | POA: Diagnosis not present

## 2015-05-10 DIAGNOSIS — E559 Vitamin D deficiency, unspecified: Secondary | ICD-10-CM | POA: Diagnosis not present

## 2015-05-10 DIAGNOSIS — E213 Hyperparathyroidism, unspecified: Secondary | ICD-10-CM | POA: Diagnosis not present

## 2015-05-10 DIAGNOSIS — Z79899 Other long term (current) drug therapy: Secondary | ICD-10-CM | POA: Diagnosis not present

## 2015-05-10 DIAGNOSIS — C61 Malignant neoplasm of prostate: Secondary | ICD-10-CM | POA: Diagnosis not present

## 2015-05-10 DIAGNOSIS — I2510 Atherosclerotic heart disease of native coronary artery without angina pectoris: Secondary | ICD-10-CM | POA: Diagnosis not present

## 2015-05-10 DIAGNOSIS — E291 Testicular hypofunction: Secondary | ICD-10-CM | POA: Diagnosis not present

## 2015-05-10 DIAGNOSIS — C7951 Secondary malignant neoplasm of bone: Secondary | ICD-10-CM | POA: Diagnosis not present

## 2015-05-10 DIAGNOSIS — D696 Thrombocytopenia, unspecified: Secondary | ICD-10-CM | POA: Diagnosis not present

## 2015-05-10 DIAGNOSIS — I5033 Acute on chronic diastolic (congestive) heart failure: Secondary | ICD-10-CM | POA: Diagnosis not present

## 2015-05-10 DIAGNOSIS — I482 Chronic atrial fibrillation: Secondary | ICD-10-CM | POA: Diagnosis not present

## 2015-05-10 DIAGNOSIS — I872 Venous insufficiency (chronic) (peripheral): Secondary | ICD-10-CM | POA: Diagnosis not present

## 2015-05-22 DIAGNOSIS — N201 Calculus of ureter: Secondary | ICD-10-CM | POA: Diagnosis not present

## 2015-05-29 ENCOUNTER — Other Ambulatory Visit: Payer: Self-pay

## 2015-05-29 ENCOUNTER — Ambulatory Visit (HOSPITAL_COMMUNITY): Payer: Medicare Other | Attending: Cardiology

## 2015-05-29 DIAGNOSIS — I059 Rheumatic mitral valve disease, unspecified: Secondary | ICD-10-CM | POA: Insufficient documentation

## 2015-05-29 DIAGNOSIS — I5032 Chronic diastolic (congestive) heart failure: Secondary | ICD-10-CM

## 2015-05-29 DIAGNOSIS — I77819 Aortic ectasia, unspecified site: Secondary | ICD-10-CM | POA: Insufficient documentation

## 2015-05-29 DIAGNOSIS — I517 Cardiomegaly: Secondary | ICD-10-CM | POA: Diagnosis not present

## 2015-05-29 DIAGNOSIS — I7121 Aneurysm of the ascending aorta, without rupture: Secondary | ICD-10-CM

## 2015-05-29 DIAGNOSIS — I712 Thoracic aortic aneurysm, without rupture: Secondary | ICD-10-CM

## 2015-05-31 DIAGNOSIS — D225 Melanocytic nevi of trunk: Secondary | ICD-10-CM | POA: Diagnosis not present

## 2015-05-31 DIAGNOSIS — D1801 Hemangioma of skin and subcutaneous tissue: Secondary | ICD-10-CM | POA: Diagnosis not present

## 2015-05-31 DIAGNOSIS — Z85828 Personal history of other malignant neoplasm of skin: Secondary | ICD-10-CM | POA: Diagnosis not present

## 2015-05-31 DIAGNOSIS — L821 Other seborrheic keratosis: Secondary | ICD-10-CM | POA: Diagnosis not present

## 2015-05-31 DIAGNOSIS — L723 Sebaceous cyst: Secondary | ICD-10-CM | POA: Diagnosis not present

## 2015-05-31 DIAGNOSIS — L570 Actinic keratosis: Secondary | ICD-10-CM | POA: Diagnosis not present

## 2015-06-05 ENCOUNTER — Ambulatory Visit (INDEPENDENT_AMBULATORY_CARE_PROVIDER_SITE_OTHER): Payer: Medicare Other | Admitting: Interventional Cardiology

## 2015-06-05 ENCOUNTER — Encounter: Payer: Self-pay | Admitting: Interventional Cardiology

## 2015-06-05 VITALS — BP 118/70 | HR 61 | Ht 75.0 in | Wt 218.0 lb

## 2015-06-05 DIAGNOSIS — I482 Chronic atrial fibrillation, unspecified: Secondary | ICD-10-CM

## 2015-06-05 DIAGNOSIS — I10 Essential (primary) hypertension: Secondary | ICD-10-CM | POA: Diagnosis not present

## 2015-06-05 DIAGNOSIS — I712 Thoracic aortic aneurysm, without rupture: Secondary | ICD-10-CM | POA: Diagnosis not present

## 2015-06-05 DIAGNOSIS — I7121 Aneurysm of the ascending aorta, without rupture: Secondary | ICD-10-CM

## 2015-06-05 DIAGNOSIS — I5032 Chronic diastolic (congestive) heart failure: Secondary | ICD-10-CM

## 2015-06-05 DIAGNOSIS — M109 Gout, unspecified: Secondary | ICD-10-CM | POA: Diagnosis not present

## 2015-06-05 MED ORDER — WARFARIN SODIUM 5 MG PO TABS: 5.0000 mg | ORAL_TABLET | Freq: Every day | ORAL | Status: AC

## 2015-06-05 NOTE — Patient Instructions (Signed)

## 2015-06-05 NOTE — Progress Notes (Signed)
Cardiology Office Note   Date:  06/05/2015   ID:  Jacob [name], DOB 1946/02/05, MRN [id number]  PCP:  Henrine Screws, MD  Cardiologist:  Sinclair Grooms, MD   Chief Complaint  Patient presents with  . Atrial Fibrillation      History of Present Illness: Jacob [name] is a 69 y.o. male who presents for chronic atrial fibrillation, chronic anticoagulation, coronary artery disease, ascending aortic aneurysm, hypertension, and COPD.  The patient is doing well. He denies angina. He denies dyspnea. No orthopnea PND. He has had no recurrence of dizziness or syncope. There is no history of head trauma.  Recent echocardiogram demonstrates normal LV size and function. Aortic diameter is stable this year measuring 4.1 cm.  Past Medical History  Diagnosis Date  . Hypertension   . Factor 5 Leiden mutation, heterozygous   . Hx of radiation therapy     external beam proton radiation 2006  . History of DVT of lower extremity     x2 1980's  . History of prostate cancer oncologist --dr Rafael Bihari [facility]--  no recurrence since 2013    dx 2004--  s/p  radical retropubic prostatectomy/  recurrence 2006  and 2013 s/p radiation at [facility]  and completed Hormone tx  . History of basal cell carcinoma excision     forehead  . Chronic atrial fibrillation   . S/P CABG x 2     09-17-1999  . Chronic diastolic heart failure   . Secondary pulmonary hypertension   . Multiple thyroid nodules     Benign bx done July 2014  . History of adenomatous polyp of colon     2009  . Adrenal adenoma     bilateral  . Gross hematuria   . Left ureteral stone   . OA (osteoarthritis)   . History of gout   . Recovering alcoholic in remission   . Chronic anticoagulation   . Coronary artery disease     cardiologist-  dr Daneen Schick--  s/p  CABG x2 2000/   last myoview 2012-- normal  . Ascending aortic aneurysm     monitored by dr dr Daneen Schick-  stable 4.4cm x 4.3cm per CT 06-01-2014  .  Borderline diabetes mellitus   . At risk for sleep apnea     STOP-BANG= 6     SENT TO PCP 03-05-2015  . Diabetes mellitus without complication     Past Surgical History  Procedure Laterality Date  . Total hip arthroplasty Left 01/18/2013    Procedure: TOTAL HIP ARTHROPLASTY;  Surgeon: Garald Balding, MD;  Location: [facility];  Service: Orthopedics;  Laterality: Left;  Left Total Hip Arthroplasty  . Colonoscopy with propofol [name] 12/27/2013    Procedure: COLONOSCOPY WITH PROPOFOL;  Surgeon: Garlan Fair, MD;  Location: [facility];  Service: Endoscopy;  Laterality: [name];  . Radical retropubic prostatectomy  02-20-2003  . Excisional bx of mass, left distal brachium  08-09-2002  . Hemorrhoid surgery  2000  . Lumbar diskecotmy and fusion  1972    L4 -- L5  . Coronary artery bypass graft  09-17-1999   dr gerhardt    x2 vessel--  LIMA to LAD and RCA  . Cardiac catheterization  09-03-1999  dr Daneen Schick    Abnormal myoview/  80% mLAD,  total occlusion pRCA , collaterals from pRCA segment and left CFX and LAD,  OM1 sig. lesion,  total occlusion very small OM2,  persived LVSF,  elevated  end-diastolic pressure  . Cardiovascular stress test  03-04-2011   dr Daneen Schick    normal perfusion study/  attenuation artifact in inferior region of myocardium,  no ischemia or infarct/scar,  unable to gate wall motion due to irregular rhythm  . Tonsillectomy  as child  . Transthoracic echocardiogram  05-10-2013  dr Daneen Schick    mild LVH/  55-60%/  severe LAE/  mild to moderate MR and TR/  mild RAE/  moderate to severe RV elevated systolic pressure/  IVC demonstates <50% collapse w/ respiration/  due to atrial fib. unable evaluate diastolic function  . Cystoscopy with retrograde pyelogram, ureteroscopy and stent placement Left 03/07/2015    Procedure: CYSTOSCOPY WITH RETROGRADE PYELOGRAM, URETEROSCOPY  WITH STONE EXTRACTION AND STENT PLACEMENT;  Surgeon: Rana Snare, MD;  Location: [facility];   Service: Urology;  Laterality: Left;  . Holmium laser application Left 9/50/9326    Procedure: HOLMIUM LASER APPLICATION;  Surgeon: Rana Snare, MD;  Location: [facility];  Service: Urology;  Laterality: Left;     Current Outpatient Prescriptions  Medication Sig Dispense Refill  . carvedilol (COREG) 3.125 MG tablet Take 3.125 mg by mouth 2 (two) times daily with a meal.    . colchicine 0.6 MG tablet Take 0.6 mg by mouth 2 (two) times daily as needed.    . dutasteride (AVODART) 0.5 MG capsule Take 0.5 mg by mouth daily.    Marland Kitchen escitalopram (LEXAPRO) 10 MG tablet Take 10 mg by mouth every morning.    . folic acid (FOLVITE) 1 MG tablet Take 1 mg by mouth daily.    . furosemide (LASIX) 40 MG tablet Take 40 mg by mouth daily.    . metformin (FORTAMET) 1000 MG (OSM) 24 hr tablet Take 1,500 mg by mouth 2 (two) times daily with a meal.    . Multiple Vitamins-Minerals (PRESERVISION AREDS PO) Take 1 tablet by mouth daily.    . Polyethylene Glycol 3350 (MIRALAX PO) Take by mouth as needed.    . ramipril (ALTACE) 2.5 MG capsule Take 2.5 mg by mouth daily.    . traMADol (ULTRAM) 50 MG tablet Take 50 mg by mouth every 6 (six) hours as needed.     No current facility-administered medications for this visit.    Allergies:   Percocet; Allopurinol; and Dilaudid    Social History:  The patient  reports that he quit smoking about 29 years ago. He has never used smokeless tobacco. He reports that he does not drink alcohol or use illicit drugs.   Family History:  The patient's family history is not on file.    ROS:  Please see the history of present illness.   Otherwise, review of systems are positive for no complaints.   All other systems are reviewed and negative.    PHYSICAL EXAM: VS:  BP 118/70 mmHg  Pulse 61  Ht 6\' 3"  (1.905 m)  Wt 98.884 kg (218 lb)  BMI 27.25 kg/m2  SpO2 99% , BMI Body mass index is 27.25 kg/(m^2). GEN: Well nourished, well developed, in no acute  distress HEENT: normal Neck: no JVD, carotid bruits, or masses Cardiac: RRR; no murmurs, rubs, or gallops,no edema  Respiratory:  clear to auscultation bilaterally, normal work of breathing GI: soft, nontender, nondistended, + BS MS: no deformity or atrophy Skin: warm and dry, no rash Neuro:  Strength and sensation are intact Psych: euthymic mood, full affect   EKG:  EKG is not ordered today.  Recent Labs: 03/07/2015: BUN 26*; Creatinine, Ser 1.66*; Hemoglobin 11.9*; Platelets 151; Potassium 4.0; Sodium 142    Lipid Panel No results found for: CHOL, TRIG, HDL, CHOLHDL, VLDL, LDLCALC, LDLDIRECT    Wt Readings from Last 3 Encounters:  06/05/15 98.884 kg (218 lb)  05/03/15 97.977 kg (216 lb)  03/07/15 99.791 kg (220 lb)      Other studies Reviewed: Additional studies/ records that were reviewed today include: . Review of the above records demonstrates:    ASSESSMENT AND PLAN:  1. Ascending aortic aneurysm Stable  2. Chronic atrial fibrillation No evidence of rapid rate.  3. Chronic diastolic heart failure No evidence of volume overload  4. Essential hypertension, benign Controlled blood pressure    Current medicines are reviewed at length with the patient today.  The patient does not have concerns regarding medicines.  The following changes have been made:  no change  Labs/ tests ordered today include:  No orders of the defined types were placed in this encounter.     Disposition:   FU with HS in 1 year  Signed, Sinclair Grooms, MD  06/05/2015 8:29 AM    [facility] [address], [place], [place]  [address] Phone: [phone]; Fax: [phone]

## 2015-06-11 DIAGNOSIS — Z7901 Long term (current) use of anticoagulants: Secondary | ICD-10-CM | POA: Diagnosis not present

## 2015-06-12 DIAGNOSIS — R7309 Other abnormal glucose: Secondary | ICD-10-CM | POA: Diagnosis not present

## 2015-06-20 DIAGNOSIS — R739 Hyperglycemia, unspecified: Secondary | ICD-10-CM | POA: Diagnosis not present

## 2015-06-25 DIAGNOSIS — Z7901 Long term (current) use of anticoagulants: Secondary | ICD-10-CM | POA: Diagnosis not present

## 2015-07-09 DIAGNOSIS — Z7901 Long term (current) use of anticoagulants: Secondary | ICD-10-CM | POA: Diagnosis not present

## 2015-07-21 DIAGNOSIS — Z23 Encounter for immunization: Secondary | ICD-10-CM | POA: Diagnosis not present

## 2015-08-06 DIAGNOSIS — Z7901 Long term (current) use of anticoagulants: Secondary | ICD-10-CM | POA: Diagnosis not present

## 2015-08-10 DIAGNOSIS — I482 Chronic atrial fibrillation: Secondary | ICD-10-CM | POA: Diagnosis not present

## 2015-08-10 DIAGNOSIS — Z7901 Long term (current) use of anticoagulants: Secondary | ICD-10-CM | POA: Diagnosis not present

## 2015-08-10 DIAGNOSIS — I5033 Acute on chronic diastolic (congestive) heart failure: Secondary | ICD-10-CM | POA: Diagnosis not present

## 2015-08-10 DIAGNOSIS — I872 Venous insufficiency (chronic) (peripheral): Secondary | ICD-10-CM | POA: Diagnosis not present

## 2015-08-10 DIAGNOSIS — C775 Secondary and unspecified malignant neoplasm of intrapelvic lymph nodes: Secondary | ICD-10-CM | POA: Diagnosis not present

## 2015-08-10 DIAGNOSIS — E291 Testicular hypofunction: Secondary | ICD-10-CM | POA: Diagnosis not present

## 2015-08-10 DIAGNOSIS — E559 Vitamin D deficiency, unspecified: Secondary | ICD-10-CM | POA: Diagnosis not present

## 2015-08-10 DIAGNOSIS — I2510 Atherosclerotic heart disease of native coronary artery without angina pectoris: Secondary | ICD-10-CM | POA: Diagnosis not present

## 2015-08-10 DIAGNOSIS — Z79899 Other long term (current) drug therapy: Secondary | ICD-10-CM | POA: Diagnosis not present

## 2015-08-10 DIAGNOSIS — C61 Malignant neoplasm of prostate: Secondary | ICD-10-CM | POA: Diagnosis not present

## 2015-08-10 DIAGNOSIS — E785 Hyperlipidemia, unspecified: Secondary | ICD-10-CM | POA: Diagnosis not present

## 2015-08-10 DIAGNOSIS — D696 Thrombocytopenia, unspecified: Secondary | ICD-10-CM | POA: Diagnosis not present

## 2015-08-10 DIAGNOSIS — E213 Hyperparathyroidism, unspecified: Secondary | ICD-10-CM | POA: Diagnosis not present

## 2015-08-10 DIAGNOSIS — C7951 Secondary malignant neoplasm of bone: Secondary | ICD-10-CM | POA: Diagnosis not present

## 2015-09-10 DIAGNOSIS — D1801 Hemangioma of skin and subcutaneous tissue: Secondary | ICD-10-CM | POA: Diagnosis not present

## 2015-09-10 DIAGNOSIS — C44311 Basal cell carcinoma of skin of nose: Secondary | ICD-10-CM | POA: Diagnosis not present

## 2015-09-10 DIAGNOSIS — L821 Other seborrheic keratosis: Secondary | ICD-10-CM | POA: Diagnosis not present

## 2015-09-10 DIAGNOSIS — Z7901 Long term (current) use of anticoagulants: Secondary | ICD-10-CM | POA: Diagnosis not present

## 2015-09-10 DIAGNOSIS — M79673 Pain in unspecified foot: Secondary | ICD-10-CM

## 2015-09-10 DIAGNOSIS — D485 Neoplasm of uncertain behavior of skin: Secondary | ICD-10-CM | POA: Diagnosis not present

## 2015-09-10 DIAGNOSIS — Z85828 Personal history of other malignant neoplasm of skin: Secondary | ICD-10-CM | POA: Diagnosis not present

## 2015-09-10 DIAGNOSIS — L720 Epidermal cyst: Secondary | ICD-10-CM | POA: Diagnosis not present

## 2015-09-18 DIAGNOSIS — C44311 Basal cell carcinoma of skin of nose: Secondary | ICD-10-CM | POA: Diagnosis not present

## 2015-09-18 DIAGNOSIS — Z85828 Personal history of other malignant neoplasm of skin: Secondary | ICD-10-CM | POA: Diagnosis not present

## 2015-10-05 DIAGNOSIS — Z7901 Long term (current) use of anticoagulants: Secondary | ICD-10-CM | POA: Diagnosis not present

## 2015-10-16 DIAGNOSIS — M5136 Other intervertebral disc degeneration, lumbar region: Secondary | ICD-10-CM | POA: Diagnosis not present

## 2015-11-05 DIAGNOSIS — M5431 Sciatica, right side: Secondary | ICD-10-CM | POA: Diagnosis not present

## 2015-11-05 DIAGNOSIS — M25551 Pain in right hip: Secondary | ICD-10-CM | POA: Diagnosis not present

## 2015-11-08 DIAGNOSIS — Z7901 Long term (current) use of anticoagulants: Secondary | ICD-10-CM | POA: Diagnosis not present

## 2015-12-04 ENCOUNTER — Encounter: Payer: Self-pay | Admitting: Podiatry

## 2015-12-04 ENCOUNTER — Ambulatory Visit (INDEPENDENT_AMBULATORY_CARE_PROVIDER_SITE_OTHER): Payer: Medicare Other

## 2015-12-04 ENCOUNTER — Ambulatory Visit (INDEPENDENT_AMBULATORY_CARE_PROVIDER_SITE_OTHER): Payer: Medicare Other | Admitting: Podiatry

## 2015-12-04 VITALS — BP 116/56 | HR 70 | Resp 12

## 2015-12-04 DIAGNOSIS — R52 Pain, unspecified: Secondary | ICD-10-CM | POA: Diagnosis not present

## 2015-12-04 DIAGNOSIS — M1000 Idiopathic gout, unspecified site: Secondary | ICD-10-CM

## 2015-12-04 DIAGNOSIS — M8430XA Stress fracture, unspecified site, initial encounter for fracture: Secondary | ICD-10-CM | POA: Diagnosis not present

## 2015-12-04 DIAGNOSIS — M109 Gout, unspecified: Secondary | ICD-10-CM | POA: Diagnosis not present

## 2015-12-04 LAB — SEDIMENTATION RATE: SED RATE: 4 mm/h (ref 0–20)

## 2015-12-04 LAB — URIC ACID: Uric Acid, Serum: 7.1 mg/dL (ref 4.0–7.8)

## 2015-12-04 NOTE — Progress Notes (Signed)
   Subjective:    Patient ID: Jacob [name], male    DOB: Apr 07, 1946, 70 y.o.   MRN: [id number]  HPI    This patient presents today with a four-day history of a throbbing painful sensation on the dorsal lateral aspect of the right foot aggravated with standing and walking. The pain and lateral border of the right foot reduces with rest and when patient wear soft athletic style shoe. He can cause the pain even in a seated position if he dorsi and plantarflex his the foot. He complains of pain in the area especially at night. He denies any direct injury to the foot or any significant change of activity. Patient relates history of gout flares in the past primarily affecting his rest, however, denies any foot gout flares. He takes cold just seen occasionally for the gout flares and he said he took one culture seen in the past 24 hours, without any relief of symptoms.   Review of Systems  Skin: Positive for color change and rash.       Objective:   Physical Exam  Pleasant orientated 3  Vascular: No peripheral edema bilaterally DP pulses 2/4 bilaterally PT pulses 0/4 bilaterally Capillary reflex immediate bilaterally  Neurological: Sensation to 10 g monofilament wire intact 0/5 bilaterally Vibratory sensation nonreactive bilaterally Ankle reflex equal and reactive bilaterally  Dermatological: No open skin lesions bilaterally Atrophic skin without hair growth bilaterally  Musculoskeletal: Manual motor testing: Dorsi flexion, plantar flexion, inversion, eversion 5/5 bilaterally  X-ray examination weightbearing right foot dated 12/04/2015  Intact bony structure without a fracture and/or dislocation Particular attention paid to the fifth metatarsal shaft which showed no bone activity  Radiographic impression: No acute bony abnormality noted in the right foot   Assessment: Bone stress reaction versus sprain foot versus possible gout flare, right foot Peripheral  neuropathy Decrease pedal pulses  Plan: At this time I reviewed the results of examination and x-ray with patient today I am referring patient to lab for sedimentation rate and serum uric acid Limits standing and walking and continue to wear athletic style shoe  Reappoint 3 weeks for repeat x-ray and follow-up examination     Assessment & Plan:

## 2015-12-04 NOTE — Patient Instructions (Signed)
Today year x-ray examination was negative for bone activity If physical examination revealed decreased sensation in your feet As you have a history of gout I'm referring you to the lab for uric acid and sedimentation rate Will notify of results of the lab

## 2015-12-05 ENCOUNTER — Telehealth: Payer: Self-pay | Admitting: *Deleted

## 2015-12-05 NOTE — Telephone Encounter (Addendum)
-----   Message from Gean Birchwood, DPM sent at 12/05/2015  7:50 AM EST ----- Lab dated 12/04/2015 Uric acid within normal limits Sedimentation within normal limits Notify patient that uric acid was the high end of normal. With his previous history of gout flares okay to take one colchicine that he has daily until foot pain ends.  Informed pt of Dr. Phoebe Perch orders, pt states he has begun the Colchicine and is feeling better.  Reminded pt of 12/26/2015 appt with Dr. Amalia Hailey.

## 2015-12-06 DIAGNOSIS — Z7901 Long term (current) use of anticoagulants: Secondary | ICD-10-CM | POA: Diagnosis not present

## 2015-12-10 DIAGNOSIS — D692 Other nonthrombocytopenic purpura: Secondary | ICD-10-CM | POA: Diagnosis not present

## 2015-12-10 DIAGNOSIS — D1801 Hemangioma of skin and subcutaneous tissue: Secondary | ICD-10-CM | POA: Diagnosis not present

## 2015-12-10 DIAGNOSIS — D225 Melanocytic nevi of trunk: Secondary | ICD-10-CM | POA: Diagnosis not present

## 2015-12-10 DIAGNOSIS — Z85828 Personal history of other malignant neoplasm of skin: Secondary | ICD-10-CM | POA: Diagnosis not present

## 2015-12-10 DIAGNOSIS — D171 Benign lipomatous neoplasm of skin and subcutaneous tissue of trunk: Secondary | ICD-10-CM | POA: Diagnosis not present

## 2015-12-10 DIAGNOSIS — L720 Epidermal cyst: Secondary | ICD-10-CM | POA: Diagnosis not present

## 2015-12-10 DIAGNOSIS — L821 Other seborrheic keratosis: Secondary | ICD-10-CM | POA: Diagnosis not present

## 2015-12-10 DIAGNOSIS — L570 Actinic keratosis: Secondary | ICD-10-CM | POA: Diagnosis not present

## 2015-12-17 DIAGNOSIS — K5900 Constipation, unspecified: Secondary | ICD-10-CM | POA: Diagnosis not present

## 2015-12-21 DIAGNOSIS — Z23 Encounter for immunization: Secondary | ICD-10-CM | POA: Diagnosis not present

## 2015-12-24 DIAGNOSIS — Z85828 Personal history of other malignant neoplasm of skin: Secondary | ICD-10-CM | POA: Diagnosis not present

## 2015-12-24 DIAGNOSIS — D0472 Carcinoma in situ of skin of left lower limb, including hip: Secondary | ICD-10-CM | POA: Diagnosis not present

## 2015-12-24 DIAGNOSIS — D485 Neoplasm of uncertain behavior of skin: Secondary | ICD-10-CM | POA: Diagnosis not present

## 2015-12-26 ENCOUNTER — Ambulatory Visit: Payer: Medicare Other | Admitting: Podiatry

## 2015-12-31 DIAGNOSIS — D0472 Carcinoma in situ of skin of left lower limb, including hip: Secondary | ICD-10-CM | POA: Diagnosis not present

## 2015-12-31 DIAGNOSIS — Z85828 Personal history of other malignant neoplasm of skin: Secondary | ICD-10-CM | POA: Diagnosis not present

## 2016-01-03 DIAGNOSIS — Z7901 Long term (current) use of anticoagulants: Secondary | ICD-10-CM | POA: Diagnosis not present

## 2016-01-23 DIAGNOSIS — E785 Hyperlipidemia, unspecified: Secondary | ICD-10-CM | POA: Diagnosis not present

## 2016-01-23 DIAGNOSIS — Z79899 Other long term (current) drug therapy: Secondary | ICD-10-CM | POA: Diagnosis not present

## 2016-01-23 DIAGNOSIS — I2510 Atherosclerotic heart disease of native coronary artery without angina pectoris: Secondary | ICD-10-CM | POA: Diagnosis not present

## 2016-01-23 DIAGNOSIS — I482 Chronic atrial fibrillation: Secondary | ICD-10-CM | POA: Diagnosis not present

## 2016-01-23 DIAGNOSIS — D696 Thrombocytopenia, unspecified: Secondary | ICD-10-CM | POA: Diagnosis not present

## 2016-01-23 DIAGNOSIS — Z7901 Long term (current) use of anticoagulants: Secondary | ICD-10-CM | POA: Diagnosis not present

## 2016-01-23 DIAGNOSIS — I5033 Acute on chronic diastolic (congestive) heart failure: Secondary | ICD-10-CM | POA: Diagnosis not present

## 2016-01-23 DIAGNOSIS — I872 Venous insufficiency (chronic) (peripheral): Secondary | ICD-10-CM | POA: Diagnosis not present

## 2016-01-23 DIAGNOSIS — C7951 Secondary malignant neoplasm of bone: Secondary | ICD-10-CM | POA: Diagnosis not present

## 2016-01-23 DIAGNOSIS — C775 Secondary and unspecified malignant neoplasm of intrapelvic lymph nodes: Secondary | ICD-10-CM | POA: Diagnosis not present

## 2016-01-23 DIAGNOSIS — E559 Vitamin D deficiency, unspecified: Secondary | ICD-10-CM | POA: Diagnosis not present

## 2016-01-23 DIAGNOSIS — C61 Malignant neoplasm of prostate: Secondary | ICD-10-CM | POA: Diagnosis not present

## 2016-01-23 DIAGNOSIS — D699 Hemorrhagic condition, unspecified: Secondary | ICD-10-CM | POA: Diagnosis not present

## 2016-01-23 DIAGNOSIS — E291 Testicular hypofunction: Secondary | ICD-10-CM | POA: Diagnosis not present

## 2016-01-23 DIAGNOSIS — E213 Hyperparathyroidism, unspecified: Secondary | ICD-10-CM | POA: Diagnosis not present

## 2016-01-23 DIAGNOSIS — I462 Cardiac arrest due to underlying cardiac condition: Secondary | ICD-10-CM | POA: Diagnosis not present

## 2016-01-28 DIAGNOSIS — C61 Malignant neoplasm of prostate: Secondary | ICD-10-CM | POA: Diagnosis not present

## 2016-01-28 DIAGNOSIS — N200 Calculus of kidney: Secondary | ICD-10-CM | POA: Diagnosis not present

## 2016-01-28 DIAGNOSIS — Z0000 Encounter for general adult medical examination without abnormal findings: Secondary | ICD-10-CM | POA: Diagnosis not present

## 2016-01-31 DIAGNOSIS — Z7901 Long term (current) use of anticoagulants: Secondary | ICD-10-CM | POA: Diagnosis not present

## 2016-02-28 DIAGNOSIS — Z7901 Long term (current) use of anticoagulants: Secondary | ICD-10-CM | POA: Diagnosis not present

## 2016-03-03 DIAGNOSIS — Z85828 Personal history of other malignant neoplasm of skin: Secondary | ICD-10-CM | POA: Diagnosis not present

## 2016-03-03 DIAGNOSIS — L821 Other seborrheic keratosis: Secondary | ICD-10-CM | POA: Diagnosis not present

## 2016-03-03 DIAGNOSIS — D485 Neoplasm of uncertain behavior of skin: Secondary | ICD-10-CM | POA: Diagnosis not present

## 2016-03-03 DIAGNOSIS — D224 Melanocytic nevi of scalp and neck: Secondary | ICD-10-CM | POA: Diagnosis not present

## 2016-03-03 DIAGNOSIS — D1801 Hemangioma of skin and subcutaneous tissue: Secondary | ICD-10-CM | POA: Diagnosis not present

## 2016-03-03 DIAGNOSIS — D225 Melanocytic nevi of trunk: Secondary | ICD-10-CM | POA: Diagnosis not present

## 2016-03-13 DIAGNOSIS — H5310 Unspecified subjective visual disturbances: Secondary | ICD-10-CM | POA: Diagnosis not present

## 2016-03-27 ENCOUNTER — Encounter: Payer: Self-pay | Admitting: Podiatry

## 2016-03-27 ENCOUNTER — Ambulatory Visit (INDEPENDENT_AMBULATORY_CARE_PROVIDER_SITE_OTHER): Payer: Medicare Other | Admitting: Podiatry

## 2016-03-27 VITALS — BP 131/69 | HR 69 | Resp 16

## 2016-03-27 DIAGNOSIS — M1000 Idiopathic gout, unspecified site: Secondary | ICD-10-CM

## 2016-03-27 DIAGNOSIS — M7751 Other enthesopathy of right foot: Secondary | ICD-10-CM | POA: Diagnosis not present

## 2016-03-27 DIAGNOSIS — M779 Enthesopathy, unspecified: Secondary | ICD-10-CM

## 2016-03-27 DIAGNOSIS — M778 Other enthesopathies, not elsewhere classified: Secondary | ICD-10-CM

## 2016-03-27 DIAGNOSIS — Z7901 Long term (current) use of anticoagulants: Secondary | ICD-10-CM | POA: Diagnosis not present

## 2016-03-27 NOTE — Patient Instructions (Signed)

## 2016-03-28 ENCOUNTER — Telehealth: Payer: Self-pay | Admitting: *Deleted

## 2016-03-28 NOTE — Telephone Encounter (Addendum)
Pt states he had an question for the doctor he saw yesterday.  I spoke with pt and he asked if his foot swelling on top was usual.  I ask him if Dr. Milinda Pointer had seen yesterday and he said yes, but didn't say what to do for it.  I told him it was not unusual to have swelling all along the foot affect with gout, and especially around the big toe joint.  I told pt to rest and elevate, follow his gout diet and take the medication as directed.  Pt states understanding.  04/10/2016-Pt asked if he needed to be on the Colchicine continually. I told pt Dr. Milinda Pointer had recommended if for the gout flare pt was experiencing, but if he was having continuing problems with gout he would need to see his PCP for Colchicine monitoring.  Pt states understanding.

## 2016-03-29 NOTE — Progress Notes (Signed)
He presents today with a one-week duration of pain to the first metatarsophalangeal joint of the right foot. He states it has been red and swollen after awaking one morning with pain last week. He denies any trauma. He does have a history of gout but has not been taking his colchicine.  Objective: Vital signs are stable alert and oriented 3. Pulses are palpable. First metatarsophalangeal joint is warm to the touch does not appear to be cellulitic there's no signs of purulence appears to be no trauma. No ecchymosis tenderness on range of motion. Radiographs taken today does not demonstrate any type of osseus abnormalities.  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  Plan: I recommended that he receive an injection of the first metatarsophalangeal joint with 20 mg Kenalog. He was okay with this. I also recommended that he start on his cultures seen. We will follow-up with him as needed.

## 2016-04-01 DIAGNOSIS — R739 Hyperglycemia, unspecified: Secondary | ICD-10-CM | POA: Diagnosis not present

## 2016-04-01 DIAGNOSIS — E559 Vitamin D deficiency, unspecified: Secondary | ICD-10-CM | POA: Diagnosis not present

## 2016-04-01 DIAGNOSIS — I5032 Chronic diastolic (congestive) heart failure: Secondary | ICD-10-CM | POA: Diagnosis not present

## 2016-04-01 DIAGNOSIS — C61 Malignant neoplasm of prostate: Secondary | ICD-10-CM | POA: Diagnosis not present

## 2016-04-01 DIAGNOSIS — E538 Deficiency of other specified B group vitamins: Secondary | ICD-10-CM | POA: Diagnosis not present

## 2016-04-01 DIAGNOSIS — Z0001 Encounter for general adult medical examination with abnormal findings: Secondary | ICD-10-CM | POA: Diagnosis not present

## 2016-04-01 DIAGNOSIS — Z1389 Encounter for screening for other disorder: Secondary | ICD-10-CM | POA: Diagnosis not present

## 2016-04-01 DIAGNOSIS — I10 Essential (primary) hypertension: Secondary | ICD-10-CM | POA: Diagnosis not present

## 2016-04-01 DIAGNOSIS — I2510 Atherosclerotic heart disease of native coronary artery without angina pectoris: Secondary | ICD-10-CM | POA: Diagnosis not present

## 2016-04-01 DIAGNOSIS — E782 Mixed hyperlipidemia: Secondary | ICD-10-CM | POA: Diagnosis not present

## 2016-04-01 DIAGNOSIS — I4891 Unspecified atrial fibrillation: Secondary | ICD-10-CM | POA: Diagnosis not present

## 2016-04-01 DIAGNOSIS — Z79899 Other long term (current) drug therapy: Secondary | ICD-10-CM | POA: Diagnosis not present

## 2016-04-01 DIAGNOSIS — E042 Nontoxic multinodular goiter: Secondary | ICD-10-CM | POA: Diagnosis not present

## 2016-04-23 DIAGNOSIS — L820 Inflamed seborrheic keratosis: Secondary | ICD-10-CM | POA: Diagnosis not present

## 2016-04-23 DIAGNOSIS — Z85828 Personal history of other malignant neoplasm of skin: Secondary | ICD-10-CM | POA: Diagnosis not present

## 2016-04-23 DIAGNOSIS — D485 Neoplasm of uncertain behavior of skin: Secondary | ICD-10-CM | POA: Diagnosis not present

## 2016-05-05 DIAGNOSIS — Z7901 Long term (current) use of anticoagulants: Secondary | ICD-10-CM | POA: Diagnosis not present

## 2016-05-15 ENCOUNTER — Encounter: Payer: Self-pay | Admitting: Podiatry

## 2016-05-15 ENCOUNTER — Ambulatory Visit (INDEPENDENT_AMBULATORY_CARE_PROVIDER_SITE_OTHER): Payer: Medicare Other | Admitting: Podiatry

## 2016-05-15 DIAGNOSIS — M79676 Pain in unspecified toe(s): Secondary | ICD-10-CM | POA: Diagnosis not present

## 2016-05-15 DIAGNOSIS — K5904 Chronic idiopathic constipation: Secondary | ICD-10-CM | POA: Diagnosis not present

## 2016-05-15 DIAGNOSIS — B351 Tinea unguium: Secondary | ICD-10-CM

## 2016-05-15 DIAGNOSIS — R413 Other amnesia: Secondary | ICD-10-CM | POA: Diagnosis not present

## 2016-05-15 DIAGNOSIS — F341 Dysthymic disorder: Secondary | ICD-10-CM | POA: Diagnosis not present

## 2016-05-15 DIAGNOSIS — R52 Pain, unspecified: Secondary | ICD-10-CM

## 2016-05-15 NOTE — Progress Notes (Signed)
He presents today with a sore toe hallux left. He states that I does want to look at it seems to be doing a little bit better but possibly an ingrown.  Objective: Signs are stable he is alert and oriented 3. Pulses are palpable. Neurologic sensorium is intact. Deep tendon reflexes are intact. Muscle strength is normal bilateral. Sharp incurvated nail margins tibiofibular borders of the hallux and the third digit left foot with nail dystrophy's.  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  Pain: I debrided the hallux and the third digit of the left foot today. Follow up with him on an as-needed basis.

## 2016-06-03 DIAGNOSIS — L821 Other seborrheic keratosis: Secondary | ICD-10-CM | POA: Diagnosis not present

## 2016-06-03 DIAGNOSIS — Z85828 Personal history of other malignant neoplasm of skin: Secondary | ICD-10-CM | POA: Diagnosis not present

## 2016-06-03 DIAGNOSIS — L570 Actinic keratosis: Secondary | ICD-10-CM | POA: Diagnosis not present

## 2016-06-03 DIAGNOSIS — L723 Sebaceous cyst: Secondary | ICD-10-CM | POA: Diagnosis not present

## 2016-06-03 DIAGNOSIS — D1801 Hemangioma of skin and subcutaneous tissue: Secondary | ICD-10-CM | POA: Diagnosis not present

## 2016-06-09 DIAGNOSIS — Z7901 Long term (current) use of anticoagulants: Secondary | ICD-10-CM | POA: Diagnosis not present

## 2016-06-23 ENCOUNTER — Encounter: Payer: Self-pay | Admitting: Psychology

## 2016-06-23 ENCOUNTER — Ambulatory Visit: Payer: Medicare Other | Attending: Psychology | Admitting: Psychology

## 2016-06-23 DIAGNOSIS — F09 Unspecified mental disorder due to known physiological condition: Secondary | ICD-10-CM

## 2016-06-23 DIAGNOSIS — F0670 Mild neurocognitive disorder due to known physiological condition without behavioral disturbance: Secondary | ICD-10-CM

## 2016-06-23 DIAGNOSIS — G3184 Mild cognitive impairment, so stated: Secondary | ICD-10-CM

## 2016-06-23 NOTE — Progress Notes (Addendum)
[facility]  [address]   Telephone [phone] [address] Fax [phone] [place], [place] [address]   [place]* This report should not be released without the consent of the client  Name:   Jacob [name]  Date of Birth:  November 23, 2046 [facility] MR#:  [id number] Date of Evaluation: 06/23/16  Reason for Referral Jacob [name] is a 70 year old left handed man who was referred for neuropsychological evaluation by Jacob Huddle MD of [facility] at [facility] due to concerns about memory loss.  Sources of Information Electronic medical records from the [facility] and from Dr. Inda [name] were reviewed. Jacob [name] and his wife, Ms.  Jacob [name], were interviewed.    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    His wife reported that his problems with word-finding and memory have notably increased over the past two years though she is not sure whether this has been a function of being around him much more since he retired in 2015. She offered recent examples of him not being able to follow simple directions, not recalling conversations from the previous day and often misplacing or losing personal items. Both he and his wife stated that he has been performing his basic and instrumental activities (i.e., driving, meal preparation, medication self-administration, bill paying) of daily living without difficulty.   Jacob [name] stated that he is concerned "to some extent" that he has been occasionally unable to find words while talking. He also acknowledged decreased memory that had been noticed by co-workers at least two years ago but he was uncertain if this problem was serious.  He  denied problems with eye-hand coordination, balance, gait, limb strength, sleep, daytime alertness, vision, hearing, swallowing, speech, smell, taste or appetite. He did not report being in any pain.  There was no report any changes in his mood, personality or habits. He denied feeling depressed, apathetic or anxious. He stated that soon after retiring in 2015 he was going "batshit" but use of Lexapro as prescribed by Dr, Jacob [name] helped him to feel calmer. He now describes retirement as "just great". His wife noted that he has been anxious about his multiple health problems though not more than usual.  Background He lives with his wife of 37 years. He has two adult children from a prior marriage that ended in divorce.  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 He did not report any history of attentional or learning problems.   He did not report any developmental problems other than a history of speech stuttering as a young person.  His past medical history was notable for ascending aortic aneurysm  (2012), chronic atrial fibrillation, chronic obstructive pulmonary disease, coronary artery disease (CABG x2 in 2010), factor V leiden deficiency, gout, hypercholesterolemia, hyperglycemia, hypertension and prostate cancer (diagnosed in 2004; prostatectomy in 2004; external beam proton radiation in 2006; reoccurrences in 2006 and 2013).  He reported no history of head injury, loss of consciousness, stroke-like symptoms, seizures, neurological infections or exposure to neurotoxic chemicals.   He reported a remote history of excessive alcohol use from his college years until his early  fifties. He estimated that he was consuming about three to four mixed drinks most every night. He reported having experienced a few blackouts while drinking but no problems related to his health, social or  occupational functioning. After a 28-day stay at [facility] in 2000, he reported that he never again abused alcohol. He reported some experimentation with illegal drugs (marijuana and cocaine) when he was younger but not in the past seventeen years. He reported that he quit smoking in 1987.   He reported no history of serious emotional difficulties or mental health contacts. His first use of antidepressant medication was in 2015.  His current medications include carvedilol, colchicine as needed, dutasteride, escitalopram, furosemide metformin, ramipril and tramadol.  Evaluation Procedures In addition to a review of medical records as well as clinical interview, the following tests or questionnaires were administered: Animal Naming Test  [facility] Naming Test Controlled Oral Word Association Test Geriatric Depression Scale (short form) Modified [employer] Complex Figure: Copy Trail Making A & B Wechsler Adult Intelligence Scale-IV: Music therapist, Coding, Digit Span & Similarities  Wechsler Memory Scale-IV: Older Adult Battery Wide Range Achievement Test-4 (Word Reading)  Assessment Results Observations & Interpretative Considerations Test results were deemed to represent a valid measure of his cognitive functioning. He did not exhibit signs of physical or emotional discomfort. He did not report or display problems with vision (he wore his eyeglasses), hearing or motor skills. He appeared to sustain attention and persist to task. He often needed test directions to be repeated as he apparently forgot them shortly after starting a task. He could not understand how to perform some tests of executive function that required complex visual sequencing or conceptual reasoning despite repeated explanation. There were no signs of careless, impulsive or perseverative responding. He was judged to have put forth optimal effort.    His pre-morbid intellectual potential was estimated to  fall at least within the High Average range based on his educational and occupational background.  His test scores were corrected to reflect norms for his age and, whenever possible, his gender and educational level (i.e., 19 years).   Attention & Processing Speed He excelled on a test of passive auditory rote attention span (WAIS-IV Digit Span Forward) that required him to repeat orally-presented digit sequences. In contrast, his performances on tests that required holding and actively manipulating information within working memory declined to the Borderline range or lower based on tests that required him to mentally rearrange digit sequences in reverse or ascending order (WAIS-IV Digit Span) or immediately recognize symbols in left to right order (Wechsler Memory Scale-IV (WMS-IV) Symbol Span).  His performances on measures of processing speed were mixed. His speed to transcribe symbols to match digits using a key (Wechsler Adult Intelligence Scale-IV (WAIS-IV) Coding) fell at the lower boundary of the Average range. In contrast, his speed to connect numbers randomly arrayed on a page in numerical sequence (Trials A) fell within the impaired range.  Runner, broadcasting/film/video functions were deficient. He was unable to complete a test that required complex sequencing and mental shifting (Trails B) as he seemed confused while attempting to maintain an alternating and ascending number to letter sequence. Measures of verbal productivity were within the impaired range as he struggled to name members of a category Banker) or generate words to designated letters (Controlled Oral Word Association Test). His performance on a measure of verbal reasoning (WAIS-IV Similarities) that required classification of ostensibly different objects or ideas to  a shared category was within the Borderline range. He was unable to complete a test of nonverbal problem-solving that required inferring logical ways to  sort geometric designs on the basis of ongoing feedback (Modified [employer]) as he seemed unable to understand what was being asked of him.   Learning & Memory Assessment of memory indicated severe impairment. He had very limited ability (i.e., <1st percentile) to learn new information (WMS-IV Immediate Memory Index).  A measure of his delayed memory (WMS-IV Delayed Memory Index) was likewise below the 1st percentile. After a delay he showed decent retention of the very small amount of stories and word pairs he had initially learned but was unable to recall any aspects of figural designs.   Language His ability to name drawings of objects to confrontation [facility] Naming Test) fell within the impaired range. As noted above, measures of his semantic and phonemic word fluency were subnormal. His word reading skill (Wide Range Achievement Test-4) fell within the Average range.  Visual-Spatial Organization & Visual-Construction  There were no signs of spatial inattention or problems with visual recognition. His ability to assemble two-dimensional block designs from models (WAIS-IV Block Design) fell at the lower boundary of the Average range. His drawing of a spatially-complex geometric design (Rey Complex Figure) was borderline impaired due to indications of reduced perceptual-motor control and minor distortions.   Emotional Status His score of 1/15 on the Geriatric Depression Scale (short form) was not suggestive of depression.    Summary & Conclusions Jacob [name] is a 70 year old man who was referred for neuropsychological evaluation due to concerns about memory loss. Both he and his wife agreed that he began to have problems with word finding and to a lesser extent memory subsequent to being treated with radiation to the head and hormone therapy in 2013 for cancer that had metastasized to his right temporal bone. His wife reported that his cognitive difficulties have increased over  the past two years though she is not sure whether this has been a function of being around him much more since he retired in 2015. He was described as able to perform his basic and instrumental activities of daily living. There was no report of recent mood changes or psychiatric disturbance.  Neuropsychological testing indicated rather severe cognitive compromise (i.e., <1st percentile) across multiple cognitive domains, including learning, memory, expressive language and executive function (i.e., sequencing, set shifting, verbal fluency and reasoning). His only intact performances were on measures of immediate rote auditory recall and visuospatial assembly.   In conclusion, his neuropsychological profile is concerning for dementia, possibly due to a neurodegenerative disorder.   Diagnostic Impression Major Neurocognitive disorder, Unspecified [F09]  Recommendations 1. Referral to neurology for further work-up is recommended.  2. In light of his cognitive compromise, he was advised to relinquish financial decision making and management tasks to his wife. She should also ensure that he is taking his medications as prescribed. Given his extremely slowed visual scanning speed, he was advised to stop driving until he has been further evaluated.    The conclusions from this evaluation were discussed with Jacob [name] and his wife on 07/03/16.   I have appreciated the opportunity to evaluate Jacob [name]. Please feel free to contact me with any comments or questions.     ______________________ Jamey Ripa, Ph.D Licensed Psychologist      ADDENDUM-NEUROPSYCHOLOGICAL TEST RESULTS  Animal Naming Test Score= 6 <1st (adjusted for age, gender and educational level)   [facility]  Naming Test Score= 42/60  1st  (adjusted for age, gender and educational level)   Controlled Oral Word Association Test Score=  19 words/2 repetitions  1st (adjusted for age, gender and educational level)    Modified [place] Card Sorting Test     Score= [name] Discontinued due to confusion   Rey Complex Figure: copy       Score= 25/36  6th - 10th Borderline/Mildly Impaired    Trails A Score=  126s  0e <1st  (adjusted for age, gender and educational level)  Trails B Score=  unable to understand [name]   Wechsler Adult Intelligence Scale-IV Subtest Scaled Score Percentile  Block Design    8 25th     Similarities    5   5th     Digit Span  Forward               Backward               Sequencing    8  16      6    1   25th   99th    9th      <1st         Coding      8  25th          Wechsler Memory Scale-IV Older Adult Battery Index Index Score Percentile  Immediate Memory  51 <1st        Auditory Memory  53 <1st    Visual Memory  50  <1st       Delayed Memory  51   <1st      Symbol Span subtest  Scaled score=6    9th        Wide Range Achievement Test-4 Subtest  Raw score Standard score Percentile  Word Reading 63/70 107 68th

## 2016-06-25 ENCOUNTER — Other Ambulatory Visit: Payer: Self-pay | Admitting: Interventional Cardiology

## 2016-06-26 ENCOUNTER — Other Ambulatory Visit: Payer: Self-pay | Admitting: Interventional Cardiology

## 2016-06-26 MED ORDER — CARVEDILOL 3.125 MG PO TABS: 3.1250 mg | ORAL_TABLET | Freq: Two times a day (BID) | ORAL | 0 refills | Status: DC

## 2016-07-03 ENCOUNTER — Ambulatory Visit: Payer: Medicare Other | Attending: Psychology | Admitting: Psychology

## 2016-07-03 ENCOUNTER — Encounter: Payer: Self-pay | Admitting: Psychology

## 2016-07-03 DIAGNOSIS — G3184 Mild cognitive impairment, so stated: Secondary | ICD-10-CM

## 2016-07-03 DIAGNOSIS — F0670 Mild neurocognitive disorder due to known physiological condition without behavioral disturbance: Secondary | ICD-10-CM

## 2016-07-03 DIAGNOSIS — F09 Unspecified mental disorder due to known physiological condition: Secondary | ICD-10-CM | POA: Insufficient documentation

## 2016-07-03 NOTE — Progress Notes (Signed)
[facility]  [address]   Telephone [phone] [address] Fax [phone] [place], [place] [address]  Follow-Up Contact Note  Name:  Jacob [name] Date of Birth: 07-27-1946 MRN:  [id number] Date:  07/03/2016  Jacob [name] is an 70 y.o. male who was referred for neuropsychological evaluation by Josetta Huddle, MD due to problems with memory.   The results and recommendaions from his neuropsychological evaluation of 06/23/16 were discussed today with Kizzie Furnish and his wife.  Jamey Ripa, Ph.D Licensed Psychologist 07/03/2016

## 2016-07-07 ENCOUNTER — Encounter: Payer: Self-pay | Admitting: Interventional Cardiology

## 2016-07-07 DIAGNOSIS — E538 Deficiency of other specified B group vitamins: Secondary | ICD-10-CM | POA: Diagnosis not present

## 2016-07-07 DIAGNOSIS — F99 Mental disorder, not otherwise specified: Secondary | ICD-10-CM | POA: Diagnosis not present

## 2016-07-07 DIAGNOSIS — Z7901 Long term (current) use of anticoagulants: Secondary | ICD-10-CM | POA: Diagnosis not present

## 2016-07-10 DIAGNOSIS — E538 Deficiency of other specified B group vitamins: Secondary | ICD-10-CM | POA: Diagnosis not present

## 2016-07-11 DIAGNOSIS — E538 Deficiency of other specified B group vitamins: Secondary | ICD-10-CM | POA: Diagnosis not present

## 2016-07-14 DIAGNOSIS — E538 Deficiency of other specified B group vitamins: Secondary | ICD-10-CM | POA: Diagnosis not present

## 2016-07-15 DIAGNOSIS — H00012 Hordeolum externum right lower eyelid: Secondary | ICD-10-CM | POA: Diagnosis not present

## 2016-07-18 DIAGNOSIS — H00012 Hordeolum externum right lower eyelid: Secondary | ICD-10-CM | POA: Diagnosis not present

## 2016-07-21 ENCOUNTER — Encounter: Payer: Self-pay | Admitting: Interventional Cardiology

## 2016-07-21 ENCOUNTER — Ambulatory Visit (INDEPENDENT_AMBULATORY_CARE_PROVIDER_SITE_OTHER): Payer: Medicare Other | Admitting: Interventional Cardiology

## 2016-07-21 VITALS — BP 136/76 | HR 61 | Ht 75.0 in | Wt 221.1 lb

## 2016-07-21 DIAGNOSIS — I482 Chronic atrial fibrillation, unspecified: Secondary | ICD-10-CM

## 2016-07-21 DIAGNOSIS — I7121 Aneurysm of the ascending aorta, without rupture: Secondary | ICD-10-CM

## 2016-07-21 DIAGNOSIS — I10 Essential (primary) hypertension: Secondary | ICD-10-CM

## 2016-07-21 DIAGNOSIS — I5032 Chronic diastolic (congestive) heart failure: Secondary | ICD-10-CM

## 2016-07-21 DIAGNOSIS — I712 Thoracic aortic aneurysm, without rupture: Secondary | ICD-10-CM | POA: Diagnosis not present

## 2016-07-21 DIAGNOSIS — I25810 Atherosclerosis of coronary artery bypass graft(s) without angina pectoris: Secondary | ICD-10-CM | POA: Diagnosis not present

## 2016-07-21 DIAGNOSIS — R413 Other amnesia: Secondary | ICD-10-CM

## 2016-07-21 DIAGNOSIS — E538 Deficiency of other specified B group vitamins: Secondary | ICD-10-CM | POA: Diagnosis not present

## 2016-07-21 DIAGNOSIS — J438 Other emphysema: Secondary | ICD-10-CM

## 2016-07-21 LAB — BASIC METABOLIC PANEL
BUN: 19 mg/dL (ref 7–25)
CALCIUM: 9.3 mg/dL (ref 8.6–10.3)
CO2: 30 mmol/L (ref 20–31)
Chloride: 104 mmol/L (ref 98–110)
Creat: 1.13 mg/dL (ref 0.70–1.18)
Glucose, Bld: 132 mg/dL — ABNORMAL HIGH (ref 65–99)
POTASSIUM: 4.1 mmol/L (ref 3.5–5.3)
Sodium: 141 mmol/L (ref 135–146)

## 2016-07-21 NOTE — Patient Instructions (Signed)
Medication Instructions:  Your physician recommends that you continue on your current medications as directed. Please refer to the Current Medication list given to you today.   Labwork: Bmet today  Testing/Procedures: Non-Cardiac CT Angiography (CTA), is a special type of CT scan that uses a computer to produce multi-dimensional views of major blood vessels throughout the body. In CT angiography, a contrast material is injected through an IV to help visualize the blood vessels   Follow-Up: Your physician wants you to follow-up in: 1 year with Dr.Smith You will receive a reminder letter in the mail two months in advance. If you don't receive a letter, please call our office to schedule the follow-up appointment.   Any Other Special Instructions Will Be Listed Below (If Applicable).     If you need a refill on your cardiac medications before your next appointment, please call your pharmacy.

## 2016-07-21 NOTE — Progress Notes (Signed)
Cardiology Office Note    Date:  07/21/2016   ID:  [name], DOB Jul 28, 1946, MRN [id number]  PCP:  Jacob Screws, MD  Cardiologist: Jacob Grooms, MD   Chief Complaint  Patient presents with  . Coronary Artery Disease  . Thoracic Aortic Aneurysm    History of Present Illness:  Jacob [name] is a 70 y.o. male chronic atrial fibrillation, chronic anticoagulation, coronary artery disease, ascending aortic aneurysm, hypertension, and COPD  He is doing well. He denies chest pain. No orthopnea, PND, or lower extremity swelling. He has had some difficulty with memory. He is seeing Dr. Brett Fairy who is working this up. He has not had blood in the urine or stool. There've been no transient neurological symptoms.   Past Medical History:  Diagnosis Date  . Adrenal adenoma    bilateral  . Ascending aortic aneurysm ([place])    monitored by dr dr Jacob [name]-  stable 4.4cm x 4.3cm per CT 06-01-2014  . At risk for sleep apnea    STOP-BANG= 6     SENT TO PCP 03-05-2015  . Borderline diabetes mellitus   . Chronic anticoagulation   . Chronic atrial fibrillation ([place])   . Chronic diastolic heart failure ([place])   . Coronary artery disease    cardiologist-  dr Jacob [name]--  s/p  CABG x2 2000/   last myoview 2012-- normal  . Diabetes mellitus without complication ([place])   . Factor 5 Leiden mutation, heterozygous ([place])   . Gross hematuria   . History of adenomatous polyp of colon    2009  . History of basal cell carcinoma excision    forehead  . History of DVT of lower extremity    x2 1980's  . History of gout   . History of prostate cancer oncologist --dr Jacob [name] [facility]--  no recurrence since 2013   dx 2004--  s/p  radical retropubic prostatectomy/  recurrence 2006  and 2013 s/p [facility]  and completed Hormone tx  . Hx of radiation therapy    external beam proton radiation 2006  . Hypertension   . Left ureteral stone   . Multiple thyroid nodules     Benign bx done July 2014  . OA (osteoarthritis)   . Recovering alcoholic in remission ([place])   . S/P CABG x 2    09-17-1999  . Secondary pulmonary hypertension ([place])     Past Surgical History:  Procedure Laterality Date  . CARDIAC CATHETERIZATION  09-03-1999  dr Jacob [name]   Abnormal myoview/  80% mLAD,  total occlusion pRCA , collaterals from pRCA segment and left CFX and LAD,  OM1 sig. lesion,  total occlusion very small OM2,  persived LVSF,  elevated end-diastolic pressure  . CARDIOVASCULAR STRESS TEST  03-04-2011   dr Jacob [name]   normal perfusion study/  attenuation artifact in inferior region of myocardium,  no ischemia or infarct/scar,  unable to gate wall motion due to irregular rhythm  . COLONOSCOPY WITH PROPOFOL [name] 12/27/2013   Procedure: COLONOSCOPY WITH PROPOFOL;  Surgeon: Jacob Fair, MD;  Location: [facility];  Service: Endoscopy;  Laterality: [name];  . CORONARY ARTERY BYPASS GRAFT  09-17-1999   dr gerhardt   x2 vessel--  LIMA to LAD and RCA  . CYSTOSCOPY WITH RETROGRADE PYELOGRAM, URETEROSCOPY AND STENT PLACEMENT Left 03/07/2015   Procedure: CYSTOSCOPY WITH RETROGRADE PYELOGRAM, URETEROSCOPY  WITH STONE EXTRACTION AND STENT PLACEMENT;  Surgeon: Jacob Snare, MD;  Location: [facility];  Service: Urology;  Laterality: Left;  . EXCISIONAL BX OF MASS, LEFT DISTAL BRACHIUM  08-09-2002  . HEMORRHOID SURGERY  2000  . HOLMIUM LASER APPLICATION Left [id number]   Procedure: HOLMIUM LASER APPLICATION;  Surgeon: Jacob Snare, MD;  Location: [facility];  Service: Urology;  Laterality: Left;  . LUMBAR DISKECOTMY AND FUSION  1972   L4 -- L5  . RADICAL RETROPUBIC PROSTATECTOMY  02-20-2003  . TONSILLECTOMY  as child  . TOTAL HIP ARTHROPLASTY Left 01/18/2013   Procedure: TOTAL HIP ARTHROPLASTY;  Surgeon: Jacob Balding, MD;  Location: [facility];  Service: Orthopedics;  Laterality: Left;  Left Total Hip Arthroplasty  . TRANSTHORACIC ECHOCARDIOGRAM   05-10-2013  dr Jacob [name]   mild LVH/  55-60%/  severe LAE/  mild to moderate MR and TR/  mild RAE/  moderate to severe RV elevated systolic pressure/  IVC demonstates <50% collapse w/ respiration/  due to atrial fib. unable evaluate diastolic function    Current Medications: Outpatient Medications Prior to Visit  Medication Sig Dispense Refill  . carvedilol (COREG) 3.125 MG tablet Take 1 tablet (3.125 mg total) by mouth 2 (two) times daily. 180 tablet 0  . colchicine 0.6 MG tablet Take 0.6 mg by mouth 2 (two) times daily as needed (gout).     Marland Kitchen dutasteride (AVODART) 0.5 MG capsule Take 0.5 mg by mouth daily.    Marland Kitchen escitalopram (LEXAPRO) 10 MG tablet Take 10 mg by mouth every morning.    . folic acid (FOLVITE) 1 MG tablet Take 1 mg by mouth daily.    . furosemide (LASIX) 40 MG tablet Take 40 mg by mouth daily.    . metformin (FORTAMET) 1000 MG (OSM) 24 hr tablet Take 2,000 mg by mouth 2 (two) times daily with a meal.     . Multiple Vitamins-Minerals (PRESERVISION AREDS PO) Take 1 tablet by mouth daily.    . Pitavastatin Calcium (LIVALO) 1 MG TABS Take 1 tablet by mouth at bedtime.    . Polyethylene Glycol 3350 (MIRALAX PO) Take 17 g by mouth 2 (two) times daily as needed (constipation).     . ramipril (ALTACE) 2.5 MG capsule Take 2.5 mg by mouth daily.    . traMADol (ULTRAM) 50 MG tablet Take 50 mg by mouth every 6 (six) hours as needed for moderate pain.     Marland Kitchen warfarin (COUMADIN) 5 MG tablet Take 1-1.5 tablets (5-7.5 mg total) by mouth daily. Take 1 tablet daily except on Sundays take 1 1/2 tablet (7.5mg )     No facility-administered medications prior to visit.      Allergies:   Percocet [oxycodone-acetaminophen]; Allopurinol; and Dilaudid [hydromorphone hcl]   Social History   Social History  . Marital status: Married    Spouse name: [name]  . Number of children: [name]  . Years of education: [name]   Social History Main Topics  . Smoking status: Former Smoker    Packs/day: 1.00     Years: 15.00    Quit date: 11/24/1985  . Smokeless tobacco: Never Used  . Alcohol use No     Comment: Recovery alcoholic --  attends AA  . Drug use: No  . Sexual activity: Not Asked   Other Topics Concern  . None   Social History Narrative  . None     Family History:  The patient's family history includes Cancer in his mother; Heart attack in his father.   ROS:   Please see the history  of present illness.    Constipation, anxiety, memory loss or other major concerns.  All other systems reviewed and are negative.   PHYSICAL EXAM:   VS:  BP 136/76   Pulse 61   Ht 6\' 3"  (1.905 m)   Wt 221 lb 1.9 oz (100.3 kg)   BMI 27.64 kg/m    GEN: Well nourished, well developed, in no acute distress  HEENT: normal  Neck: no JVD, carotid bruits, or masses Cardiac: IIRR; no murmurs, rubs, or gallops,no edema  Respiratory:  clear to auscultation bilaterally, normal work of breathing GI: soft, nontender, nondistended, + BS MS: no deformity or atrophy  Skin: warm and dry, no rash Neuro:  Alert and Oriented x 3, Strength and sensation are intact Psych: euthymic mood, full affect  Wt Readings from Last 3 Encounters:  07/21/16 221 lb 1.9 oz (100.3 kg)  06/05/15 218 lb (98.9 kg)  05/03/15 216 lb (98 kg)      Studies/Labs Reviewed:   EKG:  EKG  Atrial fibrillation with controlled rate is 61 bpm. Nonspecific T wave flattening.  Recent Labs: No results found for requested labs within last 8760 hours.   Lipid Panel No results found for: CHOL, TRIG, HDL, CHOLHDL, VLDL, LDLCALC, LDLDIRECT  Additional studies/ records that were reviewed today include:  Aortic aneurysm diameter 4.3-4.5 cm  by CT angiography 2015 and 4.1 by echo 2016.    ASSESSMENT:    1. Chronic atrial fibrillation ([place])   2. Essential hypertension, benign   3. Coronary artery disease involving coronary bypass graft of native heart without angina pectoris   4. Ascending aortic aneurysm ([place])   5. Memory loss   6.  Chronic diastolic heart failure ([facility])   7. Other emphysema ([facility])      PLAN:  In order of problems listed above:  1. Good rate control and no complications. Tolerating anticoagulation without difficulty. 2. Excellent blood pressure control on the current medical regimen. 3. Asymptomatic on the current medical regimen. 4. CT angios of the chest with contrast to assess aortic aneurysm size. May need to consider further uptitrating beta blocker therapy. 5. This is the patient's major concern at this time and is the reason that he retired. He seems to be quite happy and content. 6. No evidence of volume overload    Medication Adjustments/Labs and Tests Ordered: Current medicines are reviewed at length with the patient today.  Concerns regarding medicines are outlined above.  Medication changes, Labs and Tests ordered today are listed in the Patient Instructions below. Patient Instructions  Medication Instructions:  Your physician recommends that you continue on your current medications as directed. Please refer to the Current Medication list given to you today.   Labwork: Bmet today  Testing/Procedures: Non-Cardiac CT Angiography (CTA), is a special type of CT scan that uses a computer to produce multi-dimensional views of major blood vessels throughout the body. In CT angiography, a contrast material is injected through an IV to help visualize the blood vessels   Follow-Up: Your physician wants you to follow-up in: 1 year with Dr.Josselyne Onofrio You will receive a reminder letter in the mail two months in advance. If you don't receive a letter, please call our office to schedule the follow-up appointment.   Any Other Special Instructions Will Be Listed Below (If Applicable).     If you need a refill on your cardiac medications before your next appointment, please call your pharmacy.      Signed, Jacob Grooms, MD  07/21/2016 10:15 AM    [address], [place], [place]  [address] Phone: [phone]; Fax: [phone]

## 2016-07-23 ENCOUNTER — Telehealth: Payer: Self-pay | Admitting: Interventional Cardiology

## 2016-07-23 NOTE — Telephone Encounter (Signed)
New message ° ° ° ° °Pt returning nurse call. Please call.  °

## 2016-07-23 NOTE — Telephone Encounter (Signed)
-----   Message from Belva Crome, MD sent at 07/21/2016  5:51 PM EDT ----- Let the patient know labs are stable and OK to have CT. A copy will be sent to Henrine Screws, MD

## 2016-07-23 NOTE — Telephone Encounter (Signed)
Pt aware of lab results with verbal understanding. 

## 2016-07-24 DIAGNOSIS — H00012 Hordeolum externum right lower eyelid: Secondary | ICD-10-CM | POA: Diagnosis not present

## 2016-07-25 ENCOUNTER — Ambulatory Visit (INDEPENDENT_AMBULATORY_CARE_PROVIDER_SITE_OTHER)
Admission: RE | Admit: 2016-07-25 | Discharge: 2016-07-25 | Disposition: A | Discharge status: Home / Self Care | Payer: Medicare Other | Source: Ambulatory Visit | Attending: Interventional Cardiology | Admitting: Interventional Cardiology

## 2016-07-25 DIAGNOSIS — I712 Thoracic aortic aneurysm, without rupture: Secondary | ICD-10-CM

## 2016-07-25 DIAGNOSIS — I7121 Aneurysm of the ascending aorta, without rupture: Secondary | ICD-10-CM

## 2016-07-25 MED ORDER — IOPAMIDOL (ISOVUE-370) INJECTION 76%
100.0000 mL | Freq: Once | INTRAVENOUS | Status: AC | PRN
  Administered 2016-07-25: 100 mL via INTRAVENOUS

## 2016-07-29 DIAGNOSIS — M10032 Idiopathic gout, left wrist: Secondary | ICD-10-CM | POA: Diagnosis not present

## 2016-07-29 DIAGNOSIS — E538 Deficiency of other specified B group vitamins: Secondary | ICD-10-CM | POA: Diagnosis not present

## 2016-08-04 DIAGNOSIS — E538 Deficiency of other specified B group vitamins: Secondary | ICD-10-CM | POA: Diagnosis not present

## 2016-08-04 DIAGNOSIS — Z7901 Long term (current) use of anticoagulants: Secondary | ICD-10-CM | POA: Diagnosis not present

## 2016-08-07 ENCOUNTER — Telehealth: Payer: Self-pay | Admitting: Interventional Cardiology

## 2016-08-07 DIAGNOSIS — I7121 Aneurysm of the ascending aorta, without rupture: Secondary | ICD-10-CM

## 2016-08-07 DIAGNOSIS — I712 Thoracic aortic aneurysm, without rupture: Secondary | ICD-10-CM

## 2016-08-07 NOTE — Telephone Encounter (Signed)
Spoke with patient and advised that results of CT are not available.  I advised that it appears Dr. Tamala Julian did not receive the test to review.  I advised that I have routed the test to Dr. Tamala Julian for his review and that once he has read it, someone from our office will call back.  He verbalized understanding and thanked me for the call.

## 2016-08-07 NOTE — Telephone Encounter (Signed)
I do not see that CT has been reviewed by Dr. Tamala Julian.  I have forwarded results to him for review.

## 2016-08-07 NOTE — Telephone Encounter (Signed)
Let him know that Aorta is stable with Ascending Ao increase to 4.5 cm (from 4.4 cm on 2014). 4.5 cm is threshold for getting CV surgeon to co-manage.  Set up to see CV surgeon who did his bypass operation so that we can have co-management.

## 2016-08-07 NOTE — Telephone Encounter (Signed)
Jacob [name] is calling to get the results of his CT Scan that he had dine about 2-3wks ago. Please call  Thanks

## 2016-08-08 NOTE — Telephone Encounter (Signed)
Left message for patient to call back for results.  Referral to [facility] in epic

## 2016-08-08 NOTE — Telephone Encounter (Signed)
Reviewed results and plan of care with patient who verbalized understanding.  He requests to see Dr. Valerie Salts who did his bypass surgery years ago.  I advised that someone from their office will call him to schedule.

## 2016-08-11 ENCOUNTER — Encounter: Payer: Self-pay | Admitting: Neurology

## 2016-08-11 ENCOUNTER — Telehealth: Payer: Self-pay | Admitting: Interventional Cardiology

## 2016-08-11 ENCOUNTER — Ambulatory Visit (INDEPENDENT_AMBULATORY_CARE_PROVIDER_SITE_OTHER): Payer: Medicare Other | Admitting: Neurology

## 2016-08-11 VITALS — BP 128/70 | HR 80 | Resp 20 | Ht 75.0 in | Wt 223.0 lb

## 2016-08-11 DIAGNOSIS — F0280 Dementia in other diseases classified elsewhere without behavioral disturbance: Secondary | ICD-10-CM | POA: Diagnosis not present

## 2016-08-11 DIAGNOSIS — C7951 Secondary malignant neoplasm of bone: Secondary | ICD-10-CM

## 2016-08-11 DIAGNOSIS — C61 Malignant neoplasm of prostate: Secondary | ICD-10-CM

## 2016-08-11 DIAGNOSIS — F10230 Alcohol dependence with withdrawal, uncomplicated: Secondary | ICD-10-CM | POA: Insufficient documentation

## 2016-08-11 DIAGNOSIS — I25810 Atherosclerosis of coronary artery bypass graft(s) without angina pectoris: Secondary | ICD-10-CM

## 2016-08-11 MED ORDER — DONEPEZIL HCL 5 MG PO TABS: 5.0000 mg | ORAL_TABLET | Freq: Every day | ORAL | 1 refills | Status: DC

## 2016-08-11 NOTE — Telephone Encounter (Signed)
Spoke with pt. Pt aware that I have spoken with Dr.Cooper (DOD) and it would be ok to for him to travel.  Adv pt that we have placed the ref to Dr.Gerhardt's office they will be calling him directly to schedule.  Pt verbalized understanding, and voiced appreciation for the call back

## 2016-08-11 NOTE — Progress Notes (Signed)
[facility]   Provider:  Larey Seat, M D  Referring Provider: Josetta Huddle, MD Primary Care Physician:  Henrine Screws, MD  Chief Complaint  Patient presents with  . New Patient (Initial Visit)    memory loss    HPI:  Jacob [name] is a 70 y.o. male , seen here as a referral  from [name] for an evaluation of neurocognitive decline.   Mr. 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 He did have a borderline low vitamin B12 level in May of this year, which has been addressed and supplemented by his primary care physician, Dr. Josetta Huddle. The patient has a history of atrial fibrillation with an INR goal of 2-3, has no history of bleeding and has no history of dietary indiscretions with vitamin K noncompliance with medication. He was tested by [name] in early spring of this year,  after having shown signs of memory loss with a slow progression over the last couple of years. I do not have the original results from [name] and available here, but the patient indicated that he became increasingly fatigued and somewhat irritated at the length of the testing. The patient saw 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 Over the past 2 years he has noted word finding difficulties to increase. He retired in 2015 and his wife had noticed and gave examples of him not being able to sometimes follow simple directions, sometimes forgetting parts of conversations from the previous day and misplacing or losing personal items. 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  His past medical history is extensive ; for 17 years his ascending aortic aneurysm has been followed, but recently an enlargement was noticed.  chronic atrial fibrillation, COPD, coronary artery disease with CABG 2 in 2010, history of factor V Leiden deficiency, gouty arthritis, hypercholesterolemia, hyperglycemia, hypertension. He had external beam proton radiation in 2006 prostatectomy 2004 reoccurrences in 2006 at 2013. He has no history of head injury, stroke like symptoms seizures or neurologic infections.   I was quite surprised after today's introductory interview with the patient that he would have scored at the lowest percentile. He was apparently unable to complete some of the Tests and subsections of tests. I do wonder if this was #1 bad day and what I see today is a very good day, or if this is actually really a sign off his increasing agitation and indications considering the length of the testing.    Review of Systems: Out of a complete 14 system review, the patient complains of only the following symptoms, and all other reviewed systems are negative. Forgetfulness.   Epworth score n/a  , Fatigue severity score n/a   , depression score n/a    Social History   Social History  . Marital status: Married    Spouse name: [name]  . Number of children: [name]  . Years of education: [name]   Occupational History  . Not on file.   Social History Main Topics  .  Smoking status: Former Smoker    Packs/day: 1.00    Years: 15.00    Quit date: 11/24/1985  . Smokeless tobacco: Never Used  . Alcohol use No     Comment: Recovery alcoholic --  attends AA  . Drug use: No  . Sexual activity: Not on file   Other Topics Concern  . Not on file   Social History Narrative  . No narrative on file    Family History  Problem Relation Age of Onset  . Cancer Mother   . Heart attack Father     Past Medical  History:  Diagnosis Date  . Adrenal adenoma    bilateral  . Ascending aortic aneurysm ([place])    monitored by dr dr Daneen Schick-  stable 4.4cm x 4.3cm per CT 06-01-2014  . At risk for sleep apnea    STOP-BANG= 6     SENT TO PCP 03-05-2015  . Borderline diabetes mellitus   . Chronic anticoagulation   . Chronic atrial fibrillation ([place])   . Chronic diastolic heart failure ([place])   . Coronary artery disease    cardiologist-  dr Daneen Schick--  s/p  CABG x2 2000/   last myoview 2012-- normal  . Diabetes mellitus without complication ([place])   . Factor 5 Leiden mutation, heterozygous ([place])   . Gross hematuria   . History of adenomatous polyp of colon    2009  . History of basal cell carcinoma excision    forehead  . History of DVT of lower extremity    x2 1980's  . History of gout   . History of prostate cancer oncologist --dr Rafael Bihari [facility]--  no recurrence since 2013   dx 2004--  s/p  radical retropubic prostatectomy/  recurrence 2006  and 2013 s/p [facility] at [facility]  and completed Hormone tx  . Hx of radiation therapy    external beam proton radiation 2006  . Hypertension   . Left ureteral stone   . Multiple thyroid nodules    Benign bx done July 2014  . OA (osteoarthritis)   . Recovering alcoholic in remission ([place])   . S/P CABG x 2    09-17-1999  . Secondary pulmonary hypertension ([place])     Past Surgical History:  Procedure Laterality Date  . CARDIAC CATHETERIZATION  09-03-1999  dr Daneen Schick   Abnormal myoview/  80% mLAD,  total occlusion pRCA , collaterals from pRCA segment and left CFX and LAD,  OM1 sig. lesion,  total occlusion very small OM2,  persived LVSF,  elevated end-diastolic pressure  . CARDIOVASCULAR STRESS TEST  03-04-2011   dr Daneen Schick   normal perfusion study/  attenuation artifact in inferior region of myocardium,  no ischemia or infarct/scar,  unable to gate wall motion due to irregular rhythm  . COLONOSCOPY WITH PROPOFOL [name] 12/27/2013    Procedure: COLONOSCOPY WITH PROPOFOL;  Surgeon: Garlan Fair, MD;  Location: [facility];  Service: Endoscopy;  Laterality: [name];  . CORONARY ARTERY BYPASS GRAFT  09-17-1999   dr gerhardt   x2 vessel--  LIMA to LAD and RCA  . CYSTOSCOPY WITH RETROGRADE PYELOGRAM, URETEROSCOPY AND STENT PLACEMENT Left 03/07/2015   Procedure: CYSTOSCOPY WITH RETROGRADE PYELOGRAM, URETEROSCOPY  WITH STONE EXTRACTION AND STENT PLACEMENT;  Surgeon: Rana Snare, MD;  [facility];  Service: Urology;  Laterality: Left;  . EXCISIONAL BX OF MASS, LEFT DISTAL BRACHIUM  08-09-2002  . HEMORRHOID SURGERY  2000  . HOLMIUM LASER APPLICATION Left  03/07/2015   Procedure: HOLMIUM LASER APPLICATION;  Surgeon: Rana Snare, MD;  Location: [facility];  Service: Urology;  Laterality: Left;  . LUMBAR DISKECOTMY AND FUSION  1972   L4 -- L5  . RADICAL RETROPUBIC PROSTATECTOMY  02-20-2003  . TONSILLECTOMY  as child  . TOTAL HIP ARTHROPLASTY Left 01/18/2013   Procedure: TOTAL HIP ARTHROPLASTY;  Surgeon: Garald Balding, MD;  Location: [place];  Service: Orthopedics;  Laterality: Left;  Left Total Hip Arthroplasty  . TRANSTHORACIC ECHOCARDIOGRAM  05-10-2013  dr Daneen Schick   mild LVH/  55-60%/  severe LAE/  mild to moderate MR and TR/  mild RAE/  moderate to severe RV elevated systolic pressure/  IVC demonstates <50% collapse w/ respiration/  due to atrial fib. unable evaluate diastolic function    Current Outpatient Prescriptions  Medication Sig Dispense Refill  . carvedilol (COREG) 3.125 MG tablet Take 1 tablet (3.125 mg total) by mouth 2 (two) times daily. 180 tablet 0  . colchicine 0.6 MG tablet Take 0.6 mg by mouth 2 (two) times daily as needed (gout).     Marland Kitchen dutasteride (AVODART) 0.5 MG capsule Take 0.5 mg by mouth daily.    Marland Kitchen escitalopram (LEXAPRO) 10 MG tablet Take 10 mg by mouth every morning.    . folic acid (FOLVITE) 1 MG tablet Take 1 mg by mouth daily.    . furosemide (LASIX) 40 MG  tablet Take 40 mg by mouth daily.    Marland Kitchen LIVALO 4 MG TABS Take 1 tablet by mouth daily.  0  . metformin (FORTAMET) 1000 MG (OSM) 24 hr tablet Take 2,000 mg by mouth 2 (two) times daily with a meal.     . Multiple Vitamins-Minerals (PRESERVISION AREDS PO) Take 1 tablet by mouth daily.    . ramipril (ALTACE) 2.5 MG capsule Take 2.5 mg by mouth daily.    Marland Kitchen warfarin (COUMADIN) 5 MG tablet Take 1-1.5 tablets (5-7.5 mg total) by mouth daily. Take 1 tablet daily except on Sundays take 1 1/2 tablet (7.5mg )     No current facility-administered medications for this visit.     Allergies as of 08/11/2016 - Review Complete 08/11/2016  Allergen Reaction Noted  . Percocet [oxycodone-acetaminophen] Other (See Comments) 01/13/2013  . Allopurinol Other (See Comments) 05/25/2014  . Dilaudid [hydromorphone hcl] Other (See Comments) 12/08/2013  . Other Other (See Comments) 01/12/2013    Vitals: BP 128/70   Pulse 80   Resp 20   Ht 6\' 3"  (1.905 m)   Wt 223 lb (101.2 kg)   BMI 27.87 kg/m  Last Weight:  Wt Readings from Last 1 Encounters:  08/11/16 223 lb (101.2 kg)   [id number] mass index is 27.87 kg/m.     Last Height:   Ht Readings from Last 1 Encounters:  08/11/16 6\' 3"  (1.905 m)    Physical exam:  General: The patient is awake, alert and appears not in acute distress. The patient is well groomed. Head: Normocephalic, atraumatic. Neck is supple. Mallampati 3  neck circumference:17,  Nasal airflow patent , TMJ click evident . Retrognathia is not seen.  Severe neck stiffness.  Cardiovascular:  Regular rate and rhythm, without  murmurs or carotid bruit, and without distended neck veins. Respiratory: Lungs are clear to auscultation. Skin:  Without evidence of edema, or rash Trunk:  The patient's posture is erect .   Neurologic exam : The patient is awake and alert, oriented to place and time.   Memory subjective  described as  impaired;   MMSE - Mini Mental State Exam 08/11/2016  Orientation to  time 4  Orientation to Place 2  Registration 3  Attention/ Calculation 2  Recall 0  Language- name 2 objects 2  Language- repeat 1  Language- follow 3 step command 3  Language- read & follow direction 1  Write a sentence 1  Copy design 0  Total score 19       Attention span & concentration ability appears normal.  Speech is fluent,  with  dysarthria, hoarse-dysphonia and some  aphasia. Some stuttering.  Mood and affect are appropriate.  Cranial nerves: Pupils are equal and briskly reactive to light. Funduscopic exam without  evidence of pallor or edema. Extraocular movements  in vertical and horizontal planes intact and without nystagmus. Visual fields by finger perimetry are intact. Hearing to finger rub intact.  Facial sensation intact to fine touch. Facial motor strength is symmetric and tongue and uvula move midline. Shoulder shrug was symmetrical.  Motor exam:   Normal tone, muscle bulk and symmetric strength in all extremities. Sensory:  Fine touch, pinprick and vibration were tested in all extremities. Proprioception tested in the upper extremities was normal. Coordination: Rapid alternating movements in the fingers/hands was normal. Finger-to-nose maneuver  normal without evidence of ataxia, dysmetria or tremor. Gait and station: Patient walks without assistive device and is able unassisted to climb up to the exam table. Strength within normal limits.  Stance is stable and normal. Tandem gait is fragmented. Gait is unsteady and ataxic. Turns with 4 Steps. Romberg testing is negative. Status post left hip replacement  2014 .  Deep tendon reflexes: in the  upper and lower extremities are symmetric and intact. Babinski maneuver response is  downgoing.  The patient was advised of the nature of the diagnosed sleep disorder , the treatment options and risks for general a health and wellness arising from not treating the condition.  I spent more than 60 minutes of face to face time  with the patient. Greater than 50% of time was spent in counseling and coordination of care. We have discussed the diagnosis and differential and I answered the patient's questions.     Assessment:  After physical and neurologic examination, review of laboratory studies,  Personal review of imaging studies, reports of other /same  Imaging studies ,  Results of polysomnography/ neurophysiology testing and pre-existing records as far as provided in visit., my assessment is   1)  Moderate to severe memory decline. Likely dementia. Need to establish an MRI of the brain to see a baseline. In addition I will also order an prolonged  EEG to see the brain wave function, I reviewed the patient's medication list and previous treatments. I would also not be opposed to repeat the memory battery, but we'll asked the colleague to split the test up in 2 or more sections.   Plan:  Treatment plan and additional workup :  Aricept 5 mg daily for the next 30 days, than increase to 10 mg.  EEG 45 minutes,  MRI brain.    RV with MOCA in 4-5 weeks.    Asencion Partridge Jeanett Antonopoulos MD  08/11/2016   CC: Josetta Huddle, Md [address] [place], [place] [address]

## 2016-08-11 NOTE — Telephone Encounter (Signed)
New Message  Pt call requesting to speak with RN. Pt states he is to have aneurysm surgery and wants to know if it will be ok to travel. Please call back to discuss

## 2016-08-11 NOTE — Telephone Encounter (Signed)
Follow up  Pt voiced he is calling to speak with Lattie Haw about traveling and his aneurysm and him switching Md's.  Please f/u with pt

## 2016-08-11 NOTE — Patient Instructions (Signed)
Dementia Dementia is a word that is used to describe problems with the brain and how it works. People with dementia have memory loss. They may also have problems with thinking, speaking, or solving problems. It can affect how they act around people, how they do their job, their mood, and their personality. These changes may not show up for a long time. Family or friends may not notice problems in the early part of this disease. HOME CARE The following tips are for the person living with, or caring for, the person with dementia. Make the home safe.  Remove locks on bathroom doors.  Use childproof locks on cabinets where alcohol, cleaning supplies, or chemicals are stored.  Put outlet covers in electrical outlets.  Put in childproof locks to keep doors and windows safe.  Remove stove knobs, or put in safety knobs that shut off on their own.  Lower the temperature on water heaters.  Label medicines. Lock them in a safe place.  Keep knives, lighters, matches, power tools, and guns out of reach or in a safe place.  Remove objects that might break or can hurt the person.  Make sure lighting is good inside and outside.  Put in grab bars if needed.  Use a device that detects falls or other needs for help. Lessen confusion.  Keep familiar objects and people around.  Use night lights or low lit (dim) lights at night.  Label objects or areas.  Use reminders, notes, or directions for daily activities or tasks.  Keep a simple routine that is the same for waking, meals, bathing, dressing, and bedtime.  Create a calm and quiet home.  Put up clocks and calendars.  Keep emergency numbers and the home address near all phones.  Help show the different times of day. Open the curtains during the day to let light in. Speak clearly and directly.  Choose simple words and short sentences.  Use a gentle, calm voice.  Do not interrupt.  If the person has a hard time finding a word to  use, give them the word or thought.  Ask 1 question at a time. Give enough time for the person to answer. Repeat the question if the person does not answer. Do things that lessen restlessness.  Provide a comfortable bed.  Have the same bedtime routine every night.  Have a regular walking and activity schedule.  Lessen naps during the day.  Do not let the person drink a lot of caffeine.  Go to events that are not overwhelming. Eat well and drink fluids.  Lessen distractions during meal times and snacks.  Avoid foods that are too hot or too cold.  Watch how the person chews and swallows. This is to make sure they do not choke. Other  Keep all vision, hearing, dental, and medical visits with the doctor.  Only give medicines as told by the doctor.  Watch the person's driving ability. Do not let the person drive if he or she cannot drive safely.  Use a program that helps find a person if they become missing. You may need to register with this program. GET HELP RIGHT AWAY IF:   A fever of 102 F (38.9 C) develops.  Confusion develops or gets worse.  Sleepiness develops or gets worse.  Staying awake is hard to do.  New behavior problems start like mood swings, aggression, and seeing things that are not there.  Problems with balance, speech, or falling develop.  Problems swallowing develop.  Any  problems of another sickness develop. MAKE SURE YOU:  Understand these instructions.  Will watch his or her condition.  Will get help right away if he or she is not doing well or gets worse.   This information is not intended to replace advice given to you by your health care provider. Make sure you discuss any questions you have with your health care provider.   Document Released: 10/23/2008 Document Revised: 02/02/2012 Document Reviewed: 04/07/2011 Elsevier Interactive Patient Education [employer].

## 2016-08-11 NOTE — Telephone Encounter (Signed)
Follow Up:    Pt calling again,he says he needs to talk to you today please.

## 2016-08-14 ENCOUNTER — Encounter: Payer: Medicare Other | Admitting: Cardiothoracic Surgery

## 2016-08-18 DIAGNOSIS — H0015 Chalazion left lower eyelid: Secondary | ICD-10-CM | POA: Diagnosis not present

## 2016-08-22 DIAGNOSIS — H0015 Chalazion left lower eyelid: Secondary | ICD-10-CM | POA: Diagnosis not present

## 2016-08-26 ENCOUNTER — Encounter: Payer: Medicare Other | Admitting: Cardiothoracic Surgery

## 2016-08-26 ENCOUNTER — Ambulatory Visit
Admission: RE | Admit: 2016-08-26 | Discharge: 2016-08-26 | Disposition: A | Discharge status: Home / Self Care | Payer: Medicare Other | Source: Ambulatory Visit | Attending: Neurology | Admitting: Neurology

## 2016-08-26 DIAGNOSIS — C7951 Secondary malignant neoplasm of bone: Principal | ICD-10-CM

## 2016-08-26 DIAGNOSIS — C61 Malignant neoplasm of prostate: Secondary | ICD-10-CM | POA: Diagnosis not present

## 2016-08-26 DIAGNOSIS — R413 Other amnesia: Secondary | ICD-10-CM | POA: Diagnosis not present

## 2016-08-26 MED ORDER — GADOBENATE DIMEGLUMINE 529 MG/ML IV SOLN
20.0000 mL | Freq: Once | INTRAVENOUS | Status: AC | PRN
  Administered 2016-08-26: 20 mL via INTRAVENOUS

## 2016-08-27 ENCOUNTER — Ambulatory Visit (INDEPENDENT_AMBULATORY_CARE_PROVIDER_SITE_OTHER): Payer: Medicare Other | Admitting: Neurology

## 2016-08-27 DIAGNOSIS — R410 Disorientation, unspecified: Secondary | ICD-10-CM | POA: Diagnosis not present

## 2016-08-27 DIAGNOSIS — C7951 Secondary malignant neoplasm of bone: Principal | ICD-10-CM

## 2016-08-27 DIAGNOSIS — C61 Malignant neoplasm of prostate: Secondary | ICD-10-CM

## 2016-08-28 ENCOUNTER — Telehealth: Payer: Self-pay

## 2016-08-28 NOTE — Telephone Encounter (Signed)
I spoke to pt and advised him that Dr. Brett Fairy reviewed pt's MRI and wanted me to advise him that there was atrophy of a lobe in his brain which is consistent with memory disorder. Pt says that he would like to discuss these findings and treatment with Dr. Brett Fairy at his appt on 09/16/16. Pt verbalized understanding of results. Pt had no questions at this time but was encouraged to call back if questions arise.

## 2016-08-28 NOTE — Progress Notes (Signed)
Study date is incorrect. CD

## 2016-08-28 NOTE — Telephone Encounter (Signed)
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

## 2016-08-29 NOTE — Telephone Encounter (Signed)
Pt called back requesting to speak with [facility]. I advised him that Jacob [name] was not in the office today but Dr D would return his call today and answer his questions. He was appreciative and said he would wait for her call

## 2016-08-29 NOTE — Telephone Encounter (Signed)
Pt called in with some questions about what was told to him. He said he did not catch a portion of the results. Please call and advise [phone]

## 2016-08-29 NOTE — Telephone Encounter (Signed)
Explained the atrophy fits memory loss, mesiotemporal location.

## 2016-09-01 DIAGNOSIS — Z7901 Long term (current) use of anticoagulants: Secondary | ICD-10-CM | POA: Diagnosis not present

## 2016-09-01 DIAGNOSIS — Z23 Encounter for immunization: Secondary | ICD-10-CM | POA: Diagnosis not present

## 2016-09-02 ENCOUNTER — Institutional Professional Consult (permissible substitution) (INDEPENDENT_AMBULATORY_CARE_PROVIDER_SITE_OTHER): Payer: Medicare Other | Admitting: Cardiothoracic Surgery

## 2016-09-02 ENCOUNTER — Encounter: Payer: Self-pay | Admitting: Cardiothoracic Surgery

## 2016-09-02 VITALS — BP 139/84 | HR 69 | Resp 16 | Ht 75.0 in | Wt 220.0 lb

## 2016-09-02 DIAGNOSIS — I712 Thoracic aortic aneurysm, without rupture, unspecified: Secondary | ICD-10-CM

## 2016-09-02 DIAGNOSIS — I25810 Atherosclerosis of coronary artery bypass graft(s) without angina pectoris: Secondary | ICD-10-CM | POA: Diagnosis not present

## 2016-09-02 NOTE — Progress Notes (Signed)
[name] [address]             [phone]                    Jacob [name] [facility] Medical Record [id number] Date of Birth: 16-Oct-1946  Referring: Jacob Crome, MD Primary Care: Jacob Screws, MD  Chief Complaint:    Chief Complaint  Patient presents with  . TAA    CTA CHEST 07/25/16, MRI BRAIN 08/26/16    History of Present Illness:    Jacob [name] 70 y.o. male is seen in the office  today for history ofmildly dilated ascending aorta. The patient has known coronary occlusive disease having had coronary artery bypass grafting done by me, 2 vessels with the mammary to the LAD  17 years ago. The patient's had no further coronary artery problems, he has not had a repeat  Cardiac catheterization. He does have chronic atrial fibrillation and has been on Coumadin. 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 PET scan in 2013 and subsequent CT scans in 2014, 2015, and 2715 show mildly dilated ascending aorta 4.3 cm at the mid ascending aorta and 4.5 cm at the sinus of Valsalva. Echo shows a 3 leaflet aortic valve. Patient has no definite family history of aortic dissection. His father did die suddenly in his 54s while working under his house this was presumed to be a myocardial infarction but not confirmed.      Current Activity/ Functional Status:  Orientation, Memory, Problem Solving: independent with increased time and direction from wife    Zubrod Score: At the time of surgery this patient's most appropriate activity status/level should be described as: []     0    Normal activity, no symptoms [x]     1    Restricted in physical strenuous activity but ambulatory, able to do out light work []     2    Ambulatory and capable of self care, unable to do work activities, up and about               >50 % of waking hours                              []     3    Only limited self  care, in bed greater than 50% of waking hours []     4    Completely disabled, no self care, confined to bed or chair []     5    Moribund   Past Medical History:  Diagnosis Date  . Adrenal adenoma    bilateral  . Ascending aortic aneurysm ([place])    monitored by dr dr Jacob [name]-  stable 4.4cm x 4.3cm per CT 06-01-2014  . At risk for sleep apnea    STOP-BANG= 6     SENT TO PCP 03-05-2015  . Borderline diabetes mellitus   . Chronic anticoagulation   . Chronic atrial fibrillation ([place])   . Chronic diastolic heart failure ([place])   . Coronary artery disease    cardiologist-  dr Jacob [name]--  s/p  CABG x2 2000/   last myoview 2012-- normal  . Diabetes mellitus without complication ([place])   . Factor 5 Leiden mutation, heterozygous ([place])   . Gross hematuria   . History of  adenomatous polyp of colon    2009  . History of basal cell carcinoma excision    forehead  . History of DVT of lower extremity    x2 1980's  . History of gout   . History of prostate cancer oncologist --dr Jacob [name] [facility]--  no recurrence since 2013   dx 2004--  s/p  radical retropubic prostatectomy/  recurrence 2006  and 2013 s/p radiation at [facility]  and completed Hormone tx  . Hx of radiation therapy    external beam proton radiation 2006  . Hypertension   . Left ureteral stone   . Multiple thyroid nodules    Benign bx done July 2014  . OA (osteoarthritis)   . Recovering alcoholic in remission (Jacob [name])   . S/P CABG x 2    09-17-1999  . Secondary pulmonary hypertension     Past Surgical History:  Procedure Laterality Date  . CARDIAC CATHETERIZATION  09-03-1999  dr Jacob [name]   Abnormal myoview/  80% mLAD,  total occlusion pRCA , collaterals from pRCA segment and left CFX and LAD,  OM1 sig. lesion,  total occlusion very small OM2,  persived LVSF,  elevated end-diastolic pressure  . CARDIOVASCULAR STRESS TEST  03-04-2011   dr Jacob [name]   normal perfusion study/  attenuation artifact in inferior  region of myocardium,  no ischemia or infarct/scar,  unable to gate wall motion due to irregular rhythm  . COLONOSCOPY WITH PROPOFOL [name] 12/27/2013   Procedure: COLONOSCOPY WITH PROPOFOL;  Surgeon: Jacob Fair, MD;  Location: [facility];  Service: Endoscopy;  Laterality: [name];  . CORONARY ARTERY BYPASS GRAFT  09-17-1999   dr Jacob [name]   x2 vessel--  LIMA to LAD and RCA  . CYSTOSCOPY WITH RETROGRADE PYELOGRAM, URETEROSCOPY AND STENT PLACEMENT Left 03/07/2015   Procedure: CYSTOSCOPY WITH RETROGRADE PYELOGRAM, URETEROSCOPY  WITH STONE EXTRACTION AND STENT PLACEMENT;  Surgeon: Jacob Snare, MD;  Location: [facility];  Service: Urology;  Laterality: Left;  . EXCISIONAL BX OF MASS, LEFT DISTAL BRACHIUM  08-09-2002  . HEMORRHOID SURGERY  2000  . HOLMIUM LASER APPLICATION Left [id number]   Procedure: HOLMIUM LASER APPLICATION;  Surgeon: Jacob Snare, MD;  Location: [facility];  Service: Urology;  Laterality: Left;  . LUMBAR DISKECOTMY AND FUSION  1972   L4 -- L5  . RADICAL RETROPUBIC PROSTATECTOMY  02-20-2003  . TONSILLECTOMY  as child  . TOTAL HIP ARTHROPLASTY Left 01/18/2013   Procedure: TOTAL HIP ARTHROPLASTY;  Surgeon: Jacob Balding, MD;  Location: [facility];  Service: Orthopedics;  Laterality: Left;  Left Total Hip Arthroplasty  . TRANSTHORACIC ECHOCARDIOGRAM  05-10-2013  dr Jacob [name]   mild LVH/  55-60%/  severe LAE/  mild to moderate MR and TR/  mild RAE/  moderate to severe RV elevated systolic pressure/  IVC demonstates <50% collapse w/ respiration/  due to atrial fib. unable evaluate diastolic function    Family History  Problem Relation Age of Onset  . Cancer Mother   . Heart attack Father     Social History   Social History  . Marital status: Married    Spouse name: [name]  . Number of children: [name]  . Years of education: [name]   Occupational History  . Not on file.   Social History Main Topics  . Smoking status: Former Smoker    Packs/day:  1.00    Years: 15.00    Quit date: 11/24/1985  . Smokeless tobacco: Never Used  .  Alcohol use No     Comment: Recovery alcoholic --  attends AA  . Drug use: No  . Sexual activity: Not on file   Other Topics Concern  . Not on file   Social History Narrative  . No narrative on file    History  Smoking Status  . Former Smoker  . Packs/day: 1.00  . Years: 15.00  . Quit date: 11/24/1985  Smokeless Tobacco  . Never Used    History  Alcohol Use No    Comment: Recovery alcoholic --  attends AA     Allergies  Allergen Reactions  . Percocet [Oxycodone-Acetaminophen] Other (See Comments)    Paranoid  . Allopurinol Other (See Comments)    Thrombocytopenia   . Dilaudid [Hydromorphone Hcl] Other (See Comments)    paranoid  . Other Other (See Comments)    Pt couldn't remember the name of med he was allergic to, he was to find out and let us know    Current Outpatient Prescriptions  Medication Sig Dispense Refill  . carvedilol (COREG) 3.125 MG tablet Take 1 tablet (3.125 mg total) by mouth 2 (two) times daily. 180 tablet 0  . colchicine 0.6 MG tablet Take 0.6 mg by mouth 2 (two) times daily as needed (gout).     Marland Kitchen donepezil (ARICEPT) 5 MG tablet Take 1 tablet (5 mg total) by mouth at bedtime. 30 tablet 1  . dutasteride (AVODART) 0.5 MG capsule Take 0.5 mg by mouth daily.    Marland Kitchen escitalopram (LEXAPRO) 10 MG tablet Take 10 mg by mouth every morning.    . folic acid (FOLVITE) 1 MG tablet Take 1 mg by mouth daily.    . furosemide (LASIX) 40 MG tablet Take 40 mg by mouth daily.    Marland Kitchen LIVALO 4 MG TABS Take 1 tablet by mouth daily.  0  . metformin (FORTAMET) 1000 MG (OSM) 24 hr tablet Take 2,000 mg by mouth 2 (two) times daily with a meal.     . Multiple Vitamins-Minerals (PRESERVISION AREDS PO) Take 1 tablet by mouth daily.    . ramipril (ALTACE) 2.5 MG capsule Take 2.5 mg by mouth daily.    Marland Kitchen warfarin (COUMADIN) 5 MG tablet Take 1-1.5 tablets (5-7.5 mg total) by mouth daily. Take 1  tablet daily except on Sundays take 1 1/2 tablet (7.5mg )     No current facility-administered medications for this visit.       Review of Systems:     Cardiac Review of Systems: Y or N  Chest Pain [  n  ]  Resting SOB [ n  ] Exertional SOB  [ n ]  Orthopnea [n  ]   Pedal Edema [ n  ]    Palpitations [  n] Syncope  [n  ]   Presyncope [ y  ]  General Review of Systems: [Y] = yes [  ]=no Constitional: recent weight change Blue.Reese  ];  Wt loss over the last 3 months [   ] anorexia [  ]; fatigue [  ]; nausea [  ]; night sweats [  ]; fever [  ]; or chills [  ];          Dental: poor dentition[  ]; Last Dentist visit:   Eye : blurred vision [  ]; diplopia [   ]; vision changes [  ];  Amaurosis fugax[  ]; Resp: cough [  ];  wheezing[  ];  hemoptysis[  ]; shortness of breath[  ];  paroxysmal nocturnal dyspnea[  ]; dyspnea on exertion[  ]; or orthopnea[  ];  GI:  gallstones[  ], vomiting[  ];  dysphagia[  ]; melena[  ];  hematochezia [  ]; heartburn[  ];   Hx of  Colonoscopy[  ]; GU: kidney stones [  ]; hematuria[  ];   dysuria [  ];  nocturia[  ];  history of     obstruction [  ]; urinary frequency [  ]             Skin: rash, swelling[  ];, hair loss[  ];  peripheral edema[  ];  or itching[  ]; Musculosketetal: myalgias[  ];  joint swelling[  ];  joint erythema[  ];  joint pain[  ];  back pain[  ];  Heme/Lymph: bruising[  ];  bleeding[  ];  anemia[  ];  Neuro: TIA[  ];  headaches[  ];  stroke[  ];  vertigo[  ];  seizures[  ];   paresthesias[  ];  difficulty walking[  ];  Psych:depression[  ]; anxiety[  ];  Endocrine: diabetes[  ];  thyroid dysfunction[ y ];  Immunizations: Flu up to date [  ]; Pneumococcal up to date [  ];  Other:  Physical Exam: BP 139/84   Pulse 69   Resp 16   Ht 6\' 3"  (1.905 m)   Wt 220 lb (99.8 kg)   SpO2 98% Comment: ON RA  BMI 27.50 kg/m   PHYSICAL EXAMINATION: General appearance: alert, cooperative, appears older than stated age and mild trouble with words and asked  his wife for details requireing memory  Head: Normocephalic, without obvious abnormality, atraumatic Neck: no adenopathy, no carotid bruit, no JVD, supple, symmetrical, trachea midline and thyroid not enlarged, symmetric, no tenderness/mass/nodules Lymph nodes: Cervical, supraclavicular, and axillary nodes normal. Resp: clear to auscultation bilaterally Back: symmetric, no curvature. ROM normal. No CVA tenderness. Cardio: irregularly irregular rhythm GI: soft, non-tender; bowel sounds normal; no masses,  no organomegaly Extremities: extremities normal, atraumatic, no cyanosis or edema and Homans sign is negative, no sign of DVT Neurologic: Grossly normal  Diagnostic Studies & Laboratory data:     Recent Radiology Findings:   Mr Jeri Cos Wo Contrast  Addendum Date: 08/28/2016   CORRECTION: This study was performed on 08/26/16; not 09/05/16. Penni Bombard, MD   Result Date: 08/27/2016 [facility] NEUROIMAG[facility] REPORT STUDY DATE: 09/05/16 PATIENT NAME: Jacob [name] DOB: 03-12-1946 MRN: [id number] ORDERING CLINICIAN: Larey Seat, MD CLINICAL HISTORY: 70 year old male with memory loss. History of metastatic prostate cancer to the bones. EXAM: MRI brain (with and without) TECHNIQUE: MRI of the brain with and without contrast was obtained utilizing 5 mm axial slices with T1, T2, T2 flair, SWI and diffusion weighted views.  T1 sagittal, T2 coronal and postcontrast views in the axial and coronal plane were obtained. CONTRAST: 32ml multihance IMAGING SITE: [facility] [address] (1.5 Tesla MRI)  FINDINGS: No abnormal lesions are seen on diffusion-weighted views to suggest acute ischemia. The cortical sulci, fissures and cisterns are notable for mild enlargement of the sylvian fissures and moderate mesial temporal atrophy. Lateral, third and fourth ventricle are normal in size and appearance. No extra-axial fluid collections are seen. No evidence of mass effect  or midline shift.  Few punctate foci of non-specific gliosis in the periventricular and subcortical white matter. No abnormal lesions on post-contrast views. On sagittal views the posterior fossa, pituitary gland and corpus callosum are  unremarkable. No evidence of intracranial hemorrhage on SWI views. The orbits and their contents, paranasal sinuses and calvarium are unremarkable.  Intracranial flow voids are present.   Mildly abnormal MRI brain (with and without) demonstrating: - Mild perisylvian and moderate mesial temporal atrophy. - Few punctate foci of non-specific gliosis in the periventricular and subcortical white matter. - No abnormal lesions on post-contrast views. - No acute findings. INTERPRETING PHYSICIAN: Penni Bombard, MD Certified in Neurology, Neurophysiology and Neuroimaging [facility] [address] [place], [place] [address] [phone]    I have independently reviewed the above radiologic studies.  Recent Lab Findings: Lab Results  Component Value Date   WBC 4.1 03/07/2015   HGB 11.9 (L) 03/07/2015   HCT 36.8 (L) 03/07/2015   PLT 151 03/07/2015   GLUCOSE 132 (H) 07/21/2016   ALT 23 07/22/2013   AST 29 07/22/2013   NA 141 07/21/2016   K 4.1 07/21/2016   CL 104 07/21/2016   CREATININE 1.13 07/21/2016   BUN 19 07/21/2016   CO2 30 07/21/2016   INR 1.93 (H) 03/07/2015   HGBA1C 5.8 (H) 01/18/2013   CT chest : CLINICAL DATA:  F/u to aortic aneurysm  EXAM: CT ANGIOGRAPHY CHEST WITH CONTRAST  TECHNIQUE: Multidetector CT imaging of the chest was performed using the standard protocol during bolus administration of intravenous contrast. Multiplanar CT image reconstructions and MIPs were obtained to evaluate the vascular anatomy.  CONTRAST:  100 mL Isovue 370 IV  COMPARISON:  CT abdomen 02/26/2015, chest 06/01/2014  FINDINGS: Vascular: Right arm IV contrast administration. The SVC is patent. Right atrial enlargement. Mild  right ventral to the ventricular dilatation. Satisfactory opacification of pulmonary arteries noted, and there is no evidence of pulmonary emboli. Patent bilateral pulmonary veins drain into the left atrium. Left atrial enlargement. Incomplete opacification distally in the left atrial appendage suggesting thrombus; findings reviewed with Dr. Weber Cooks, who concurs. Moderate coronary calcifications. Previous CABG. Thoracic aortic segmental diameters as follows:  4.4 cm sinuses of Valsalva  3.6 cm sino-tubular junction  4.5 cm proximal ascending (previously 4.4 cm)  3.4 cm distal ascending/proximal arch  2.7 cm distal arch  3.3 cm proximal descending  2.5 cm distal descending  Scattered atheromatous calcifications in the distal arch and descending thoracic segment. No intra luminal thrombus. No dissection or stenosis. Classic 3 vessel brachiocephalic arterial origin anatomy without proximal stenosis. Tortuous distal descending segment.  Mediastinum/Lymph Nodes: 3.6 cm mass from the inferior margin right lobe of the thyroid. No hilar or mediastinal adenopathy. No pericardial effusion.  Lungs/Pleura: Patchy infiltrate or subsegmental atelectasis in posterior basal segment left lower lobe. Minimal dependent atelectasis posteriorly in both lower lobes. No pleural effusion. No pneumothorax.  Upper abdomen: No acute findings.  Musculoskeletal: Stable T7 and L1 compression deformities. Previous median sternotomy.  Review of the MIP images confirms the above findings.  IMPRESSION: 1. 4.5 cm ascending aortic aneurysm without complicating features. Recommend semi-annual imaging followup by CTA or MRA and referral to cardiothoracic surgery if not already obtained. This recommendation follows 2010 ACCF/AHA/AATS/ACR/ASA/SCA/SCAI/SIR/STS/SVM Guidelines for the Diagnosis and Management of Patients With Thoracic Aortic Disease. Circulation. 2010; 121: [id number] 2.  Thrombus versus incomplete opacification in the left atrial appendage. Correlate with TEE. 3. Patchy infiltrate or subsegmental atelectasis, posterior left lower lobe. 4. Right thyroid mass as previously described.   Electronically Signed   By: Lucrezia Europe M.D.   On: 07/25/2016 14:08  ECHO: 2016 History:   PMH:  Ascending aortic aneurysm. Acquired from  the patient and from the patient&'s chart.  Atrial fibrillation. Coronary artery disease.  Congestive heart failure.  Risk factors: Hypertension.  ------------------------------------------------------------------- Study Conclusions  - Left ventricle: The cavity size was normal. There was mild   concentric hypertrophy. Systolic function was normal. The   estimated ejection fraction was in the range of 60% to 65%. Wall   motion was normal; there were no regional wall motion   abnormalities. There was no evidence of elevated ventricular   filling pressure by Doppler parameters. - Aorta: Ascending aortic diameter: 41 mm (S). - Ascending aorta: The ascending aorta was mildly dilated. - Mitral valve: Calcified annulus. - Left atrium: The atrium was moderately dilated. - Right atrium: The atrium was mildly dilated. - Pulmonary arteries: Systolic pressure was mildly increased. PA   peak pressure: 33 mm Hg (S).  Echocardiography.  M-mode, complete 2D, spectral Doppler, and color Doppler.  Birthdate:  Patient birthdate: Aug 14, 1946.  Age:  Patient is 70 yr old.  Sex:  Gender: male.    BMI: 28.6 kg/m^2.  Blood pressure:     127/62  Patient status:  Outpatient.  Study date: Study date: 05/29/2015. Study time: 01:52 PM.  Location:  [facility]  -------------------------------------------------------------------  ------------------------------------------------------------------- Left ventricle:  The cavity size was normal. There was mild concentric hypertrophy. Systolic function was normal. The estimated ejection fraction  was in the range of 60% to 65%. Wall motion was normal; there were no regional wall motion abnormalities. There was no evidence of elevated ventricular filling pressure by Doppler parameters.  ------------------------------------------------------------------- Aortic valve:   Trileaflet; normal thickness leaflets. Mobility was not restricted.  Doppler:  Transvalvular velocity was within the normal range. There was no stenosis. There was no regurgitation.   ------------------------------------------------------------------- Aorta:  Ascending aorta: The ascending aorta was mildly dilated.  ------------------------------------------------------------------- Mitral valve:   Calcified annulus. Mobility was not restricted. Doppler:  Transvalvular velocity was within the normal range. There was no evidence for stenosis. There was no regurgitation.    Peak gradient (D): 3 mm Hg.  ------------------------------------------------------------------- Left atrium:  The atrium was moderately dilated.  ------------------------------------------------------------------- Right ventricle:  The cavity size was normal. Wall thickness was normal. Systolic function was normal.  ------------------------------------------------------------------- Pulmonic valve:   Poorly visualized.  Structurally normal valve. Cusp separation was normal.  Doppler:  Transvalvular velocity was within the normal range. There was no evidence for stenosis. There was trivial regurgitation.  ------------------------------------------------------------------- Tricuspid valve:   Structurally normal valve.    Doppler: Transvalvular velocity was within the normal range. There was mild regurgitation.  ------------------------------------------------------------------- Pulmonary artery:   The main pulmonary artery was normal-sized. Systolic pressure was mildly  increased.  ------------------------------------------------------------------- Right atrium:  The atrium was mildly dilated.  ------------------------------------------------------------------- Pericardium:  There was no pericardial effusion.  ------------------------------------------------------------------- Systemic veins: Inferior vena cava: The vessel was dilated. The respirophasic diameter changes were in the normal range (= 50%), consistent with normal central venous pressure.  ------------------------------------------------------------------- Measurements   Left ventricle                           Value        Reference  LV ID, ED, PLAX chordal                  45.5  mm     43 - 52  LV ID, ES, PLAX chordal  28.8  mm     23 - 38  LV fx shortening, PLAX chordal           37    %      >=29  LV PW thickness, ED                      12.1  mm     ---------  IVS/LV PW ratio, ED                      1.04         <=1.3  Stroke volume, 2D                        60    ml     ---------  Stroke volume/bsa, 2D                    25    ml/m^2 ---------  LV e&', lateral                           15    cm/s   ---------  LV E/e&', lateral                         5.3          ---------  LV e&', medial                            7.51  cm/s   ---------  LV E/e&', medial                          10.59        ---------  LV e&', average                           11.26 cm/s   ---------  LV E/e&', average                         7.06         ---------    Ventricular septum                       Value        Reference  IVS thickness, ED                        12.6  mm     ---------    LVOT                                     Value        Reference  LVOT ID, S                               20    mm     ---------  LVOT area                                3.14  cm^2   ---------  LVOT ID                                  20    mm     ---------  LVOT peak velocity, S                     101   cm/s   ---------  LVOT mean velocity, S                    66.2  cm/s   ---------  LVOT VTI, S                              19.1  cm     ---------  LVOT peak gradient, S                    4     mm Hg  ---------  Stroke volume (SV), LVOT DP              60    ml     ---------  Stroke index (SV/bsa), LVOT DP           25.4  ml/m^2 ---------    Aortic valve                             Value        Reference  Aortic regurg pressure half-time         459   ms     ---------    Aorta                                    Value        Reference  Aortic root ID, ED                       38    mm     ---------  Ascending aorta ID, A-P, S               41    mm     ---------    Left atrium                              Value        Reference  LA ID, A-P, ES                           56    mm     ---------  LA ID/bsa, A-P                   (H)     2.37  cm/m^2 <=2.2  LA volume, S                             106   ml     ---------  LA volume/bsa, S                         44.9  ml/m^2 ---------  LA volume,  ES, 1-p A4C                   119   ml     ---------  LA volume/bsa, ES, 1-p A4C               50.4  ml/m^2 ---------  LA volume, ES, 1-p A2C                   86    ml     ---------  LA volume/bsa, ES, 1-p A2C               36.4  ml/m^2 ---------    Mitral valve                             Value        Reference  Mitral E-wave peak velocity              79.5  cm/s   ---------  Mitral A-wave peak velocity              51.3  cm/s   ---------  Mitral deceleration time         (H)     236   ms     150 - 230  Mitral peak gradient, D                  3     mm Hg  ---------  Mitral E/A ratio, peak                   1.5          ---------    Pulmonary arteries                       Value        Reference  PA pressure, S, DP               (H)     33    mm Hg  <=30    Tricuspid valve                          Value        Reference  Tricuspid regurg peak velocity           250   cm/s    ---------  Tricuspid peak RV-RA gradient            25    mm Hg  ---------    Systemic veins                           Value        Reference  Estimated CVP                            8     mm Hg  ---------    Right ventricle                          Value        Reference  RV pressure, S, DP               (H)     33    mm Hg  <=30  RV s&', lateral, S                        12.9  cm/s   ---------  Legend: (L)  and  (H)  mark values outside specified reference range.  ------------------------------------------------------------------- Prepared and Electronically Authenticated by  Candee Furbish, M.D. 2016-07-05T15:03:40  Aortic Size Index=         /Body surface area is 2.3 meters squared. =1.95   < 2.75 cm/m2      4% risk per year 2.75 to 4.25          8% risk per year > 4.25 cm/m2    20% risk per year   Assessment / Plan:    1/ 4.5 cm ascending aortic aneurysm without complicating features. 2/ Aortic valve:   Trileaflet; normal thickness leaflets. There was no stenosis. There was no     Regurgitation. By echo 2016 3/ Atrial fibrillation on chronic Coumadin therapy   4/ mild cognitive/memory problems. 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 At this point I would not recommend any operative intervention, discontinued monitoring. We will plan on a follow-up CTA of the chest in 8 months. I discussed with the patient is wife the risk of aortic dissection, symptoms, and need to avoid hypertension or heavy straining.   I  spent 40 minutes counseling the patient face to face and 50% or more the  time was spent in counseling and coordination of care. The total time spent in the appointment was 60 minutes.  Grace Isaac MD      [address] [place] [address] Office [phone]   Beeper [phone]  09/02/2016 3:10  PM

## 2016-09-02 NOTE — Patient Instructions (Signed)
It's best to avoid activities that cause grunting or straining (medically referred to as a "valsalva maneuver"). This happens when a person bears down against a closed throat to increase the strength of arm or abdominal muscles. There's often a tendency to do this when lifting heavy weights, doing sit-ups, push-ups or chin-ups, etc., but it may be harmful.       Thoracic Aortic Aneurysm An aneurysm is a bulge in an artery. It happens when the wall of the artery is weakened or damaged. If the aneurysm gets too big, it bursts (ruptures) and severe bleeding occurs. A thoracic aortic aneurysm is an aneurysm that occurs in the first part of the aorta, between the heart and the diaphragm. The aorta is the main artery and supplies blood from the heart to the rest of the body. A thoracic aortic aneurysm can enlarge and rupture or blood can flow between the layers of the wall of the aorta through a tear (aorticdissection). Both of these conditions can cause bleeding inside the body and can be life threatening unless diagnosed and treated promptly. CAUSES  The exact cause of a thoracic aortic aneurysm is often unknown. Some contributing factors are:   A hardening of the arteries caused by the buildup of fat and other substances in the lining of a blood vessel (arteriosclerosis).  Inflammation of the walls of an artery (arteritis).  Connective tissue diseases, such as Marfan syndrome.  Injury or trauma to the aorta.  An infection, such as syphilis or staphylococcus, in the wall of the aorta (infectious aortitis) caused by bacteria. RISK FACTORS  Risk factors that contribute to a thoracic aortic aneurysm may include:  Age older than 24 years.  High blood pressure (hypertension).  Male gender.  Ethnicity (white race).  Obesity.  Family history of aneurysm (first degree relatives only).  Tobacco use. PREVENTION  The following healthy lifestyle habits may help decrease your risk of a thoracic  aortic aneurysm:  Quitting smoking. Smoking can raise your blood pressure and cause arteriosclerosis.  Limiting or avoiding alcohol.  Keeping your blood pressure, blood sugar level, and cholesterol levels within normal limits.  Decreasing your salt intake. In some people, too much salt can raise blood pressure and increase your risk of abdominal aortic aneurysm.  Eating a diet low in saturated fats and cholesterol.  Increasing your fiber intake by including whole grains, vegetables, and fruits in your diet. Eating these foods may help lower blood pressure.  Maintaining a healthy weight.  Staying physically active and exercising regularly. SYMPTOMS  The symptoms of thoracic aortic aneurysm may vary depending on the size and rate of growth of the aneurysm. Most grow slowly and do not have any symptoms. When symptoms do occur, they may include:  Pain (chest, back, sides, or abdomen). The pain may vary in intensity. A sudden onset of severe pain may indicate that the aneurysm has ruptured.  Hoarseness.  Cough.  Shortness of breath.  Swallowing problems.  Nausea or vomiting or both. DIAGNOSIS  Since most unruptured thoracic aortic aneurysms have no symptoms, they are often discovered during diagnostic exams for other conditions. An aneurysm may be found during the following procedures:  Ultrasonography (a one-time screening for thoracic aortic aneurysm by ultrasonography is also recommended for all men aged 73-75 years who have ever smoked).  X-ray exams.  A CT scan.  An MRI.  Angiography or arteriography. TREATMENT  Treatment of a thoracic aortic aneurysm depends on the size of your aneurysm, your age, and risk  Medicine to control blood pressure and pain may be used to manage aneurysms smaller than 2.3 in (6 cm). Regular monitoring for enlargement may be recommended by your health care provider if:  The aneurysm is 1.2-1.5 in (3-4 cm) in size (an annual  ultrasonography may be recommended).  The aneurysm is 1.5-1.8 in (4-4.5 cm) in size (an ultrasonography every 6 months may be recommended).  The aneurysm is larger than 1.8 in (4.5 cm) in size (your health care provider may ask that you be examined by a vascular surgeon). If your aneurysm is larger than 2.2 in (5.5 cm) or if it is enlarging quickly, surgical repair may be recommended. There are two main methods for repair of an aneurysm:   Endovascular repair (a minimally invasive surgery).  Open repair. This method is used if an endovascular repair is not possible.   This information is not intended to replace advice given to you by your health care provider. Make sure you discuss any questions you have with your health care provider.   Document Released: 11/10/2005 Document Revised: 08/31/2013 Document Reviewed: 05/23/2013 Elsevier Interactive Patient Education 2016 [employer]  Aortic Dissection An aortic dissection is a tear in your aorta. The aorta is the main blood vessel that carries blood out of your heart to supply the rest of your body. It comes out of your heart and curves around, then goes down through your chest (thoracic aorta) and into your belly (abdominal aorta). The wall of the aorta has inner and outer layers. Aortic dissection occurs most often in the thoracic aorta. This is more likely to happen if the inner layer of the aorta has a weak spot or gets injured. As the dissection widens and blood flows through it, the aorta becomes "double-barreled." This means that one part of the aorta continues to carry blood. However, the inner wall begins to separate from the rest of the aorta as blood flows through the tear. The torn part of the aorta fills with blood. It swells up like a balloon. This can reduce blood flow through the part of the aorta that is still working. Aortic dissection is a medical emergency. CAUSES Aortic dissection happens when there is a tear in the inner  wall of the aorta. An injury or weakness can cause this tear. Sometimes the exact cause of the tear is not known. RISK FACTORS You may be at greater risk for aortic dissection if you:  Have certain medical conditions, such as uncontrolled high blood pressure or atherosclerosis.  Have a blunt injury to your chest.  Have a genetic disorder that affects the connective tissue, such as Marfan syndrome, Turner syndrome, and Ehlers-Danlos syndrome.  Are born with a problem that affects either your aorta or your heart valve.  Have a condition that causes inflammation of blood vessels, such as giant cell arteritis.  Are male.  Are older than 72 years of age.  Use cocaine.  Smoke.  Lift very heavy weights or do other types of high-intensity resistance training. SIGNS AND SYMPTOMS  Signs and symptoms of aortic dissection may start suddenly. Changes in position may make symptoms worse. The most common symptoms are:  Severe chest pain that may feel like a tearing, stabbing, or sharp pain.  Pain that shifts to the shoulder, arm, neck, jaw, abdomen, or hips. Other symptoms may include:  Severe abdominal pain.  Trouble breathing.  Dizziness or fainting.  Nausea or vomiting.  Trouble swallowing.  Sweating a lot.  Feeling confused, dazed, anxious,   confused, dazed, anxious, or fearful. DIAGNOSIS Your health care provider may suspect aortic dissection based on your signs and symptoms and will perform a physical exam. During the physical exam, your health care provider may listen for abnormal blood flow sounds (murmurs) in your chest or your belly. You may also have your blood pressure checked to see whether it is low or whether there is a difference between the measurements in your arms and your legs. You may also have tests such as:  Electrocardiogram (ECG). This is a test that measures the electrical activity in your heart.  Chest X-ray.  CT scan or MRI.  Aortic angiogram. This test uses the injection of a  dye to make it easier to see your blood vessels clearly.  Echocardiogram to study your heart using sound waves. TREATMENT It is important to treat an aortic dissection as quickly as possible. Your treatment may start as soon as your health care provider suspects aortic dissection. Treatment will depend on how severe your dissection is, where it is located, and your overall health. Treatment options include:  Medicines to lower your blood pressure.  Surgery to remove the dissected part of your aorta and replace it with a graft.  Medical procedures to thread long, thin tubes (catheters) into the aorta (endovascular procedures). This may be done to place a graft or a balloon in the blood vessel to improve blood flow or prevent further dissection. HOME CARE INSTRUCTIONS  Work with your health care provider to keep your blood pressure under control.  Avoid activities that could cause an injury to your chest or your abdomen.  Do not smoke. If you need help quitting, ask your health care provider.  Do not participate in sports or exercises that involve lifting weights.  Keep all follow-up visits as directed by your health care provider. This is important. SEEK MEDICAL CARE IF:  You develop any new symptoms of aortic dissection after treatment. SEEK IMMEDIATE MEDICAL CARE IF:  You have severe pain in your chest or your abdomen.  You have trouble breathing. These symptoms may represent a serious problem that is an emergency. Do not wait to see if the symptoms will go away. Get medical help right away. Call your local emergency services (911 in the [place] Do not drive yourself to the hospital.   This information is not intended to replace advice given to you by your health care provider. Make sure you discuss any questions you have with your health care provider.   Document Released: 02/17/2008 Document Revised: 12/01/2014 Document Reviewed: 06/21/2014 Elsevier Interactive Patient Education  [employer].

## 2016-09-03 DIAGNOSIS — Z85828 Personal history of other malignant neoplasm of skin: Secondary | ICD-10-CM | POA: Diagnosis not present

## 2016-09-03 DIAGNOSIS — D485 Neoplasm of uncertain behavior of skin: Secondary | ICD-10-CM | POA: Diagnosis not present

## 2016-09-03 DIAGNOSIS — L723 Sebaceous cyst: Secondary | ICD-10-CM | POA: Diagnosis not present

## 2016-09-03 DIAGNOSIS — D1801 Hemangioma of skin and subcutaneous tissue: Secondary | ICD-10-CM | POA: Diagnosis not present

## 2016-09-03 DIAGNOSIS — L821 Other seborrheic keratosis: Secondary | ICD-10-CM | POA: Diagnosis not present

## 2016-09-03 DIAGNOSIS — D225 Melanocytic nevi of trunk: Secondary | ICD-10-CM | POA: Diagnosis not present

## 2016-09-03 DIAGNOSIS — D171 Benign lipomatous neoplasm of skin and subcutaneous tissue of trunk: Secondary | ICD-10-CM | POA: Diagnosis not present

## 2016-09-04 NOTE — Telephone Encounter (Signed)
Wife Arbie Cookey called regarding MRI and EEG results, states husband has memory loss and nurse called patient w/results and he doesn't remember what nurse said. Wife would like to know what scan shows and would like to know before patient's  appointment on October 24th w/Dr. Dohmeier. Please call [phone].

## 2016-09-04 NOTE — Telephone Encounter (Signed)
I spoke to pt's wife. She says that she wants to know if pt has alzheimer's disease and wants Dr. Brett Fairy to call her right now because pt is in the shower and she "needs to know what I am dealing with."  I spoke to Dr. Brett Fairy. She says that she cannot make any further diagnoses until she evaluates pt again in the office and does not want to return this pt's wife's call at this time.  I spoke to pt's wife again and advised her that before any further diagnosis are made, Dr. Brett Fairy needs to evaluate pt further in the office. Pt's wife verbalized understanding.

## 2016-09-05 DIAGNOSIS — E538 Deficiency of other specified B group vitamins: Secondary | ICD-10-CM | POA: Diagnosis not present

## 2016-09-05 NOTE — Procedures (Signed)
   [facility]  EEG (ELECTROENCEPHALOGRAM) REPORT   STUDY DATE: 08/27/16 PATIENT NAME: Jacob [name] DOB: October 26, 1946 MRN: [id number]  ORDERING CLINICIAN: Larey Seat, MD   TECHNOLOGIST: Laretta Alstrom  TECHNIQUE: Electroencephalogram was recorded utilizing standard 10-20 system of lead placement and reformatted into average and bipolar montages.  RECORDING TIME: 41 minutes ACTIVATION: photic stimulation  CLINICAL INFORMATION: 70 year old male with cognitive decline.  FINDINGS: Background rhythms of 12-13 hertz and 40-50 microvolts. No focal, lateralizing, epileptiform activity or seizures are seen. Patient recorded in the awake and drowsy state. EKG channel shows irregular rhythm of 60-65 beats per minute.  IMPRESSION:  Normal EEG in the awake and drowsy states. Also of note, EKG channel consistent with patient's known diagnosis of atrial fibrillation.     INTERPRETING PHYSICIAN:  Penni Bombard, MD Certified in Neurology, Neurophysiology and Neuroimaging  [facility] [address], [place], [place] [address] [phone]

## 2016-09-08 DIAGNOSIS — E538 Deficiency of other specified B group vitamins: Secondary | ICD-10-CM | POA: Diagnosis not present

## 2016-09-08 DIAGNOSIS — F99 Mental disorder, not otherwise specified: Secondary | ICD-10-CM | POA: Diagnosis not present

## 2016-09-16 ENCOUNTER — Ambulatory Visit (INDEPENDENT_AMBULATORY_CARE_PROVIDER_SITE_OTHER): Payer: Medicare Other | Admitting: Neurology

## 2016-09-16 ENCOUNTER — Encounter: Payer: Self-pay | Admitting: Neurology

## 2016-09-16 VITALS — BP 122/72 | HR 64 | Resp 20 | Ht 75.0 in | Wt 216.0 lb

## 2016-09-16 DIAGNOSIS — F0390 Unspecified dementia without behavioral disturbance: Secondary | ICD-10-CM | POA: Diagnosis not present

## 2016-09-16 DIAGNOSIS — I25810 Atherosclerosis of coronary artery bypass graft(s) without angina pectoris: Secondary | ICD-10-CM

## 2016-09-16 MED ORDER — DONEPEZIL HCL 10 MG PO TABS: 10.0000 mg | ORAL_TABLET | Freq: Every day | ORAL | 3 refills | Status: DC

## 2016-09-16 NOTE — Addendum Note (Signed)
Addended by: Larey Seat on: 09/16/2016 03:03 PM   Modules accepted: Orders

## 2016-09-16 NOTE — Progress Notes (Signed)
[facility]   Provider:  Larey Seat, M D  Referring Provider: Josetta Huddle, MD Primary Care Physician:  Henrine Screws, MD  Chief Complaint  Patient presents with  . Follow-up    memory and results    HPI:  Jacob [name] is a 70 y.o. male , seen here as a referral  from [name] for an evaluation of neurocognitive decline.   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 He did have a borderline low vitamin B12 level in May of this year, which has been addressed and supplemented by his primary care physician, Dr. Josetta Huddle. The patient has a history of atrial fibrillation with an INR goal of 2-3, has no history of bleeding and has no history of dietary indiscretions with vitamin K noncompliance with medication. He was tested by [name] in early spring of this year,  after having shown signs of memory loss with a slow progression over the last couple of years. I do not have the original results from [name] and available here, but the patient indicated that he became increasingly fatigued and somewhat irritated at the length of the testing. The patient saw 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 Over the past 2 years he has noted word finding difficulties to increase. He retired in 2015 and his wife had noticed and gave examples of him not being able to sometimes follow simple directions, sometimes forgetting parts of conversations from the previous day and misplacing or losing personal items. 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  His past medical history is extensive ; for 17 years his ascending aortic aneurysm has been followed, but recently an enlargement was noticed.  chronic atrial fibrillation, COPD, coronary artery disease with CABG 2 in 2010, history of factor V Leiden deficiency, gouty arthritis, hypercholesterolemia, hyperglycemia, hypertension. He had external beam proton radiation in 2006 prostatectomy 2004 reoccurrences in 2006 at 2013. He has no history of head injury, stroke like symptoms seizures or neurologic infections.  I was quite surprised after today's introductory interview with the patient that he would have scored at the lowest percentile. He was apparently unable to complete some of the Tests and subsections of tests. I do wonder if this was #1 bad day and what I see today is a very good day, or if this is actually really a sign off his increasing agitation and indications considering the length of the testing.  09-16-2016 My diagnosis is that of a progressive memory loss disorder, which also is indicated by the MRI report, showing perisylvian and moderate mesial temporal atrophy. There is some D.O. service. This is not unilateral but bilaterally fairly symmetric and I suspect that I cannot attribute this to the proton deep therapy he received. My colleague did not make it comment about the shape of the cerebellopontine but should be influenced if dementia is induced by alcohol abuse. I would also like to say that the EEG was a normal base trunk rhythm which very much  surprised. I would like to treat Mr. [name] is an Alzheimer patient but there is no indication of a vascular dementia. Aricept increased to 10 mg daily. and Namenda titrationpack ( Dr Marcellus Scott ) .     Review of Systems: Out of a complete 14 system review, the patient complains of only the following symptoms, and all other reviewed systems are  negative. Forgetfulness.   MMSE - Mini Mental State Exam 09/16/2016 08/11/2016  Orientation to time 0 4  Orientation to Place 4 2  Registration 3 3  Attention/ Calculation 1 2  Recall 1 0  Language- name 2 objects 2 2  Language- repeat 1 1  Language- follow 3 step command 2 3  Language- read & follow direction 1 1  Write a sentence 1 1  Copy design 1 0  Total score 17 19        Social History   Social History  . Marital status: Married    Spouse name: [name]  . Number of children: [name]  . Years of education: [name]   Occupational History  . Not on file.   Social History Main Topics  . Smoking status: Former Smoker    Packs/day: 1.00    Years: 15.00    Quit date: 11/24/1985  . Smokeless tobacco: Never Used  . Alcohol use No     Comment: Recovery alcoholic --  attends AA  . Drug use: No  . Sexual activity: Not on file   Other Topics Concern  . Not on file   Social History Narrative  . No narrative on file    Family History  Problem Relation Age of Onset  . Cancer Mother   . Heart attack Father     Past Medical History:  Diagnosis Date  . Adrenal adenoma    bilateral  . Ascending aortic aneurysm ([place])    monitored by dr dr Daneen Schick-  stable 4.4cm x 4.3cm per CT 06-01-2014  . At risk for sleep apnea    STOP-BANG= 6     SENT TO PCP 03-05-2015  . Borderline diabetes mellitus   . Chronic anticoagulation   . Chronic atrial fibrillation ([place])   . Chronic diastolic heart failure ([place])   . Coronary artery disease    cardiologist-  dr Daneen Schick--  s/p  CABG x2 2000/   last myoview 2012-- normal  . Diabetes mellitus without complication ([place])   . Factor 5 Leiden mutation, heterozygous ([place])   . Gross hematuria   . History of adenomatous polyp of colon    2009  . History of basal cell carcinoma excision    forehead  . History of DVT of lower extremity    x2 1980's  . History of gout   . History of prostate cancer oncologist --dr Rafael Bihari  [facility]--  no recurrence since 2013   dx 2004--  s/p  radical retropubic prostatectomy/  recurrence 2006  and 2013 s/p radiation at [facility]  and completed Hormone tx  . Hx of radiation therapy    external beam proton radiation 2006  . Hypertension   . Left ureteral stone   . Multiple thyroid nodules    Benign bx done July 2014  . OA (osteoarthritis)   . Recovering alcoholic in remission ([place])   . S/P CABG x 2    09-17-1999  . Secondary pulmonary hypertension     Past Surgical History:  Procedure Laterality Date  . CARDIAC CATHETERIZATION  09-03-1999  dr Mallie Mussel  [facility]   Abnormal myoview/  80% mLAD,  total occlusion pRCA , collaterals from pRCA segment and left CFX and LAD,  OM1 sig. lesion,  total occlusion very small OM2,  persived LVSF,  elevated end-diastolic pressure  . CARDIOVASCULAR STRESS TEST  03-04-2011   dr Daneen Schick   normal perfusion study/  attenuation artifact in inferior region of myocardium,  no ischemia or infarct/scar,  unable to gate wall motion due to irregular rhythm  . COLONOSCOPY WITH PROPOFOL [name] 12/27/2013   Procedure: COLONOSCOPY WITH PROPOFOL;  Surgeon: Garlan Fair, MD;  Location: [facility];  Service: Endoscopy;  Laterality: [name];  . CORONARY ARTERY BYPASS GRAFT  09-17-1999   dr gerhardt   x2 vessel--  LIMA to LAD and RCA  . CYSTOSCOPY WITH RETROGRADE PYELOGRAM, URETEROSCOPY AND STENT PLACEMENT Left 03/07/2015   Procedure: CYSTOSCOPY WITH RETROGRADE PYELOGRAM, URETEROSCOPY  WITH STONE EXTRACTION AND STENT PLACEMENT;  Surgeon: Rana Snare, MD;  Location: [facility];  Service: Urology;  Laterality: Left;  . EXCISIONAL BX OF MASS, LEFT DISTAL BRACHIUM  08-09-2002  . HEMORRHOID SURGERY  2000  . HOLMIUM LASER APPLICATION Left [id number]   Procedure: HOLMIUM LASER APPLICATION;  Surgeon: Rana Snare, MD;  Location: [facility];  Service: Urology;  Laterality: Left;  . LUMBAR DISKECOTMY AND FUSION  1972   L4 -- L5  . RADICAL  RETROPUBIC PROSTATECTOMY  02-20-2003  . TONSILLECTOMY  as child  . TOTAL HIP ARTHROPLASTY Left 01/18/2013   Procedure: TOTAL HIP ARTHROPLASTY;  Surgeon: Garald Balding, MD;  Location: [facility];  Service: Orthopedics;  Laterality: Left;  Left Total Hip Arthroplasty  . TRANSTHORACIC ECHOCARDIOGRAM  05-10-2013  dr Daneen Schick   mild LVH/  55-60%/  severe LAE/  mild to moderate MR and TR/  mild RAE/  moderate to severe RV elevated systolic pressure/  IVC demonstates <50% collapse w/ respiration/  due to atrial fib. unable evaluate diastolic function    Current Outpatient Prescriptions  Medication Sig Dispense Refill  . carvedilol (COREG) 3.125 MG tablet Take 1 tablet (3.125 mg total) by mouth 2 (two) times daily. 180 tablet 0  . colchicine 0.6 MG tablet Take 0.6 mg by mouth 2 (two) times daily as needed (gout).     . Cyanocobalamin (B-12 COMPLIANCE INJECTION IJ) Inject as directed.    . donepezil (ARICEPT) 5 MG tablet Take 1 tablet (5 mg total) by mouth at bedtime. 30 tablet 1  . dutasteride (AVODART) 0.5 MG capsule Take 0.5 mg by mouth daily.    Marland Kitchen escitalopram (LEXAPRO) 10 MG tablet Take 10 mg by mouth every morning.    . folic acid (FOLVITE) 1 MG tablet Take 1 mg by mouth daily.    . furosemide (LASIX) 40 MG tablet Take 40 mg by mouth daily.    Marland Kitchen LIVALO 4 MG TABS Take 1 tablet by mouth daily.  0  . metformin (FORTAMET) 1000 MG (OSM) 24 hr tablet Take 2,000 mg by mouth 2 (two) times daily with a meal.     . Multiple Vitamins-Minerals (PRESERVISION AREDS PO) Take 1 tablet by mouth daily.    . ramipril (ALTACE) 2.5 MG capsule Take 2.5 mg by mouth daily.    Marland Kitchen warfarin (COUMADIN) 5 MG tablet Take 1-1.5 tablets (5-7.5 mg total) by mouth daily. Take 1 tablet daily except on Sundays take 1 1/2 tablet (7.5mg )     No current facility-administered medications for this visit.     Allergies as of 09/16/2016 -  Review Complete 09/16/2016  Allergen Reaction Noted  . Percocet [oxycodone-acetaminophen] Other  (See Comments) 01/13/2013  . Allopurinol Other (See Comments) 05/25/2014  . Dilaudid [hydromorphone hcl] Other (See Comments) 12/08/2013  . Other Other (See Comments) 01/12/2013    Vitals: BP 122/72   Pulse 64   Resp 20   Ht 6\' 3"  (1.905 m)   Wt 216 lb (98 kg)   BMI 27.00 kg/m  Last Weight:  Wt Readings from Last 1 Encounters:  09/16/16 216 lb (98 kg)   [id number] mass index is 27 kg/m.     Last Height:   Ht Readings from Last 1 Encounters:  09/16/16 6\' 3"  (1.905 m)    Physical exam:  General: The patient is awake, alert and appears not in acute distress. The patient is well groomed. Head: Normocephalic, atraumatic. Neck is supple. Mallampati 3  neck circumference:17,  Nasal airflow patent , TMJ click evident . Retrognathia is not seen.  Severe neck stiffness.  Cardiovascular:  Regular rate and rhythm, without  murmurs or carotid bruit, and without distended neck veins. Respiratory: Lungs are clear to auscultation. Skin:  Without evidence of edema, or rash Trunk:  The patient's posture is erect .   Neurologic exam : The patient is awake and alert, oriented to place and time.   Memory subjective  described as impaired;   MMSE - Mini Mental State Exam 09/16/2016 08/11/2016  Orientation to time 0 4  Orientation to Place 4 2  Registration 3 3  Attention/ Calculation 1 2  Recall 1 0  Language- name 2 objects 2 2  Language- repeat 1 1  Language- follow 3 step command 2 3  Language- read & follow direction 1 1  Write a sentence 1 1  Copy design 1 0  Total score 17 19       Attention span & concentration ability appears normal.  Speech is fluent,  with  dysarthria, hoarse-dysphonia and some  aphasia. Some stuttering.  Mood and affect are appropriate.  Cranial nerves: Pupils are equal and briskly reactive to light. Funduscopic exam without  evidence of pallor or edema. Extraocular movements  in vertical and horizontal planes intact and without nystagmus. Visual fields by  finger perimetry are intact. Hearing to finger rub intact.  Facial sensation intact to fine touch. Facial motor strength is symmetric and tongue and uvula move midline. Shoulder shrug was symmetrical.  Motor exam:   Normal tone, muscle bulk and symmetric strength in all extremities. Sensory:  Fine touch, pinprick and vibration were tested in all extremities. Proprioception tested in the upper extremities was normal. Coordination: Rapid alternating movements in the fingers/hands was normal. Finger-to-nose maneuver  normal without evidence of ataxia, dysmetria or tremor. Gait and station: Patient walks without assistive device and is able unassisted to climb up to the exam table. Strength within normal limits.  Stance is stable and normal. Tandem gait is fragmented. Gait is unsteady and ataxic. Turns with 4 Steps. Romberg testing is negative. Status post left hip replacement  2014 .  Deep tendon reflexes: in the  upper and lower extremities are symmetric - Babinski maneuver response is downgoing.  The patient was advised of the nature of the diagnosed dementia disorder , the treatment options and risks for general a health and wellness arising from not treating the condition.  I spent more than 25  minutes of face to face time with the patient. Greater than 50% of time was spent in counseling and coordination of care. We  have discussed the diagnosis and differential and I answered the patient's questions.     Assessment:  After physical and neurologic examination, review of laboratory studies,  Personal review of imaging studies, reports of other /same  Imaging studies ,  Results of polysomnography/ neurophysiology testing and pre-existing records as far as provided in visit., my assessment is   1)  Moderate to severe memory decline. Likely dementia. Need to establish an MRI of the brain to see a baseline. In addition I will also order an prolonged  EEG to see the brain wave function, I reviewed the  patient's medication list and previous treatments.    Plan:  Treatment plan and additional workup :  Aricept 5 mg daily for the next 30 days, than increase to 10 mg. namenda dose pack.  PET scan ordered.  [employer] 3 times weekly, stay physically active.  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       Asencion Partridge Lakira Ogando MD  09/16/2016   CC: Josetta Huddle, Md [address] [place], [place] [address]

## 2016-09-17 ENCOUNTER — Telehealth: Payer: Self-pay | Admitting: Neurology

## 2016-09-17 NOTE — Telephone Encounter (Signed)
I spoke to pt's wife. She is quite concerned. She says that [name] had given pt a sample pack of namzaric titration and she told Dr. Brett Fairy this at the appt yesterday. Pt says that Dr. Brett Fairy then told them that the pt needs to start taking aricept 10mg  as well. Pt's wife is concerned that pt is now going to go from 5mg  of aricept to 20mg  of aricept (because the namzaric has 10mg  of aricept included, plus the separate 10mg  of aricept that Dr. Brett Fairy prescribed yesterday.) Pt's wife wants to make sure that Dr. Brett Fairy understands that the pt will be increasing from 5mg  of aricept daily to 20mg  of aricept daily.  Of note, Dr. Edwena Felty note says, "Aricept 5 mg daily for the next 30 days, than increase to 10 mg. namenda dose pack. " This note mentions namenda dose pack, not namzaric.  Dr. Brett Fairy, were you aware that [name] has already prescribed the namzaric titration pack for the pt? Do you still want the pt to continue taking the aricept 10mg  in addition to the namzaric?

## 2016-09-17 NOTE — Telephone Encounter (Signed)
Wife called to discuss donepezil (ARICEPT) 10 MG tablet and NAMZARIC, needs clarification on taking this medication that is to start tonight.

## 2016-09-18 ENCOUNTER — Telehealth: Payer: Self-pay | Admitting: Neurology

## 2016-09-18 ENCOUNTER — Other Ambulatory Visit: Payer: Self-pay

## 2016-09-18 MED ORDER — MEMANTINE HCL ER 7 & 14 & 21 &28 MG PO CP24: ORAL_CAPSULE | ORAL | 0 refills | Status: DC

## 2016-09-18 NOTE — Progress Notes (Signed)
D/C the namzaric and aricept per Dr. Edwena Felty note.

## 2016-09-18 NOTE — Telephone Encounter (Signed)
I spoke with Mrs. Kaiser and apologized for the duplication of Aricept. I will take him of Aricept completely after she mentioned her cardiac concerns regarding her husband's cardiac health. He will be on Namenda and upon her request on a once a day Namenda. I wrote for the starter pack.  Larey Seat, MD

## 2016-09-18 NOTE — Telephone Encounter (Signed)
Jamie/ [facility] called in needing clarification on medication Memantine HCl ER (NAMENDA XR TITRATION PACK) 7 & 14 & 21 &28 MG CP24 . Please call and advise [phone] [facility] [address] [place], [place] [phone] (Phone) [phone] (Fax)

## 2016-09-18 NOTE — Telephone Encounter (Signed)
I called pt's wife. She is very upset. She says that Dr. Brett Fairy called her from an area code [phone] number, and that is why she did not answer. She says that she is very upset that Dr. Brett Fairy did not catch this mistake and that no one has apologized for this mistake. She says that "I am not a doctor and so I should not have to catch these mistakes." She is asking for Dr. Brett Fairy to call and apologize to her and discuss the aricept dosing mistake.  Pt's wife is asking that Dr. Brett Fairy call her at [phone].

## 2016-09-18 NOTE — Telephone Encounter (Signed)
I spoke to [place] at [place]. She is wanting to know if pt is to continue taking the aricept and namzaric. I advised her that per Dr. Brett Fairy, pt will d/c aricept and namzaric, and only take namenda at this time. The namenda titration pack will be dispensed but Roselyn Reef advised me that a PA is needed for the drug. I will complete the PA.

## 2016-09-18 NOTE — Telephone Encounter (Signed)
Pt's wife called said she has not heard back from RN. I advised Dr D had LVM.  I read Dr D VM to her and advised her to listen to VM. She is wanting RN to call at [phone] (cell) for clarification

## 2016-09-18 NOTE — Telephone Encounter (Signed)
I called the patient. I was under the impression he started on NAMENDA XR, and indeed should not be taking Namziric and aricept extra. I called and explained they don't need to add aricept to Namziric.   Voice mail. Larey Seat, MD

## 2016-09-18 NOTE — Telephone Encounter (Signed)
Completed PA for namenda XR titration pack through [facility].  Will have a determination in 3 business days.

## 2016-09-19 ENCOUNTER — Telehealth: Payer: Self-pay | Admitting: Neurology

## 2016-09-19 DIAGNOSIS — F0280 Dementia in other diseases classified elsewhere without behavioral disturbance: Secondary | ICD-10-CM

## 2016-09-19 DIAGNOSIS — F0151 Vascular dementia with behavioral disturbance: Secondary | ICD-10-CM

## 2016-09-19 DIAGNOSIS — F01518 Vascular dementia, unspecified severity, with other behavioral disturbance: Secondary | ICD-10-CM

## 2016-09-19 DIAGNOSIS — G308 Other Alzheimer's disease: Secondary | ICD-10-CM

## 2016-09-19 NOTE — Telephone Encounter (Signed)
Julie/[employer] [phone] called needing to know if the pt has moderate to severe alzheimer or mild to moderate vascular disease

## 2016-09-19 NOTE — Telephone Encounter (Signed)
Tyara/[employer] called to state Memantine HCl ER (NAMENDA XR TITRATION PACK) 7 & 14 & 21 &28 MG CP24 has been denied. Letter to the pt has been mailed.

## 2016-09-19 NOTE — Telephone Encounter (Signed)
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  Degree is moderate to severe.  Namenda XR will be covered , the [employer] reviewer will call Mrs Ulrich to let her know.   Jeb Levering, MD

## 2016-09-19 NOTE — Telephone Encounter (Signed)
I just spoke to the insurance office and they approved it after our conversation. May be it is the ER form they object to.?  Did they tell you why?  CD

## 2016-09-22 ENCOUNTER — Other Ambulatory Visit: Payer: Self-pay | Admitting: Interventional Cardiology

## 2016-09-22 MED ORDER — MEMANTINE HCL 28 X 5 MG & 21 X 10 MG PO TABS
ORAL_TABLET | ORAL | 12 refills | Status: DC
Start: 2016-09-22 — End: 2016-10-22

## 2016-09-22 NOTE — Telephone Encounter (Signed)
Mrs. Achter is all right with choosing the non-extended release form of Namenda first, should her husband not tolerated or indeed forget it to many times she may switch to buying the Er form of the med  out right. CD

## 2016-09-22 NOTE — Telephone Encounter (Signed)
I told Mrs. Cousins I will write for the generic non extended form of namenda . There is ablister pack for the first 28 days available on formulary.   If insurance doesn't cover this one either , this is the schedule and I will spoke to her and explained this . :  5 mg Namenda in AM for 1 week.  Second week, 5 mg bid,   Third week, 10 mg in AM and 5 mg in Pm ,  Fourth and last titration week is 10 mg bid po. We will refill from here on out at that dose.   PET scan was ordered , Wednesday 25 th oct.   C Ita Fritzsche,

## 2016-09-22 NOTE — Telephone Encounter (Signed)
The titration pack was denied. Pt cannot get the blister pack for namenda XR unless he pays out of pocket for it. (I think it will be more that $500, based on my search of prices.)  This is why [employer] recommended dosing with memantine HCL 5mg  and 10mg . If this is what you decide, the orders must be placed and I will have to complete another prior authorization.

## 2016-09-22 NOTE — Telephone Encounter (Signed)
I called [facility]. The memantine titration did go through, it will be in tomorrow, but there is an $84 copay. A prior Josem Kaufmann is not needed.

## 2016-09-22 NOTE — Telephone Encounter (Signed)
If pt's wife is ok with taking namenda (non XR), can you place the orders with the correct titration schedule?

## 2016-09-22 NOTE — Telephone Encounter (Signed)
I spent 10 mins on the phone with [place]. The namenda XR titration pack was denied because pt has not tried and failed 2 formulary alternatives. (He has only tried donepezil).  [employer] recommends that pt try memantine HCL 5mg  for 28 days and then increase to 10mg . This medication will still need a pa.  Pt's wife wants a call to discuss. How do you want to proceed?

## 2016-09-22 NOTE — Telephone Encounter (Signed)
Pt's wife called in about namenda approval. [employer] did not give me a reason for denial. Pts wife would like a call back , please.

## 2016-09-22 NOTE — Telephone Encounter (Signed)
The titration schedule is part of the starter pack, its printed with blister pack instructions.

## 2016-09-24 ENCOUNTER — Telehealth: Payer: Self-pay | Admitting: Neurology

## 2016-09-24 NOTE — Telephone Encounter (Signed)
Pt called wanting to know if he can Melatonin along with his other medications he is taking. Please call and advise [phone]

## 2016-09-24 NOTE — Telephone Encounter (Signed)
I spoke to Jacob [name]. I advised him that Dr. Brett Fairy said that Jacob [name] may take melatonin with his other medications and that it is very safe. Jacob [name] verbalized understanding.  Jacob [name] is asking about when his PET scan will be scheduled.  Hinton Dyer, can you look into this?

## 2016-09-24 NOTE — Telephone Encounter (Signed)
Yes, melatonin is very safe ! Its used for REM sleep disorder control.

## 2016-09-29 ENCOUNTER — Telehealth: Payer: Self-pay | Admitting: Neurology

## 2016-09-29 DIAGNOSIS — Z5181 Encounter for therapeutic drug level monitoring: Secondary | ICD-10-CM | POA: Diagnosis not present

## 2016-09-29 DIAGNOSIS — C61 Malignant neoplasm of prostate: Secondary | ICD-10-CM | POA: Diagnosis not present

## 2016-09-29 NOTE — Telephone Encounter (Signed)
Absolutely not fit for jury duty/  Due to cognitive decline/ Azheimer's not able to return to any jury duty or witness stand.  CD

## 2016-09-29 NOTE — Telephone Encounter (Signed)
Pt called in stating he has jury duty on Thursday. He is wanting to know if Dr. Brett Fairy would write him a letter stating he is not fit for jury duty. He is willing to come pick it up tomorrow. Please call and advise [phone]

## 2016-09-30 ENCOUNTER — Telehealth: Payer: Self-pay | Admitting: *Deleted

## 2016-09-30 NOTE — Telephone Encounter (Signed)
Patient came in to collect his letter, patient would like for the nurse to call him about a medication NAMENDA, pt states it is causing him to have a lot of diarrhea. Please advise [phone]

## 2016-09-30 NOTE — Telephone Encounter (Signed)
Pt called back to advise he needs it documented in the letter for jury duty that it should state what he has. He wants the document to read instead of "unfit" it should state he has alzheimer and memory issues. Patient advised he will need to take this letter to court this Thursday 10/02/16. He is wanting to know if it could be done today. Please call the pt  [phone]

## 2016-09-30 NOTE — Telephone Encounter (Signed)
I spent greater than 10 minutes on the phone with this pt and pt's wife.  Pt and pt's wife are requesting that the letter written to excuse him from serving on the witness stand and to not say anything regarding jury duty. Pt is insistent that the letter say he has alzheimer's disease. (I reviewed Dr. Edwena Felty notes and there is not a diagnosis of alzheiemer's, but there is a diagnosis of dementia.) Pt says that he has to give this letter to the judge so he can not be a witness in a lawsuit.  Pt is also saying that he has had a bout of diarrhea today and that he didn't make it to the bathroom. This has only happened one time. He is currently taking memantine 5mg  daily but will be increasing to 10mg  daily within the next week. Pt's wife is especially concerned that this increase in memantine will cause the diarrhea to be worse and wants to know if there is anything pt can take to stop the diarrhea.  I spoke to Dr. Brett Fairy, and Dr. Brett Fairy advised me to add "I am treating Mr. Delacerda for dementia and I expect a progressive cognitive decline." to pt's letter and remove "jury duty". Letter printed.

## 2016-09-30 NOTE — Telephone Encounter (Signed)
I spoke to pt. I advised him that Dr. Brett Fairy signed the letter and that it is ready for pick up at the front desk. Pt says that his wife is "running around" and he will tell her to come pick it up. I advised him that our office closes today at 5:30pm.  I also advised pt that Dr. Brett Fairy recommends taking the namenda with a banana or applesauce to help combat diarrhea. Pt verbalized understanding.

## 2016-09-30 NOTE — Telephone Encounter (Signed)
Please schedule

## 2016-09-30 NOTE — Telephone Encounter (Signed)
I called pt and advised him that his letter for jury duty is available for pick up at the front desk. Pt verbalized understanding.

## 2016-09-30 NOTE — Telephone Encounter (Signed)
Dementia diagnosis is serving the same purpose als alzheimer's diagnosis. He has a progressive memory loss disorder.   he may take namenda with a banana or with applesauce to help combat diarrhea.   Letter can be stating witness stand instead of jury duty.

## 2016-09-30 NOTE — Telephone Encounter (Signed)
Called and spoke to [facility] at [place] [id number]. Lea relayed that insurance does not cover NM PET Brain Amyloid.  I attempted to enroll pt in a research trial (the PET scan may be paid for if he is enrolled in research) but pt does not qualify because he has afib. I was advised by Lea that pt will have to pay for scan out of pocket if they wish to proceed with it and it could be between $3000 and $4000. I called pt's wife and advised her of this information. Pt's wife says that they are willing to pay out of pocket for this PET scan, knowing full well that this could cost between $3000-$4000. Pt's wife is requesting that the PET scan be scheduled next Wednesday or Thursday.

## 2016-10-01 NOTE — Telephone Encounter (Signed)
Patient and his wife came to the front desk and asked if I could get Patient scheduled for 10/07/2016. Patient is scheduled for PET scan 10/07/2016 arrive at [facility] long radiology at 2:30 for 3:00 apt.   Patient also has a follow apt with Dr. Brett Fairy 10/22/2016.

## 2016-10-07 ENCOUNTER — Encounter (HOSPITAL_COMMUNITY)
Admission: RE | Admit: 2016-10-07 | Discharge: 2016-10-07 | Disposition: A | Discharge status: Home / Self Care | Payer: Medicare Other | Source: Ambulatory Visit | Attending: Neurology | Admitting: Neurology

## 2016-10-07 ENCOUNTER — Telehealth: Payer: Self-pay | Admitting: Neurology

## 2016-10-07 DIAGNOSIS — R413 Other amnesia: Secondary | ICD-10-CM | POA: Diagnosis not present

## 2016-10-07 DIAGNOSIS — R9089 Other abnormal findings on diagnostic imaging of central nervous system: Secondary | ICD-10-CM | POA: Insufficient documentation

## 2016-10-07 DIAGNOSIS — F0390 Unspecified dementia without behavioral disturbance: Secondary | ICD-10-CM | POA: Insufficient documentation

## 2016-10-07 NOTE — Telephone Encounter (Signed)
PET scan demonstrated increased uptake of the nucleotide , indicating Alzheimer's disease as the primary diagnosis.  The patient now has better tolerance to Namenda and continued taking the medication. He would like his PCP to get notified of the diagnosis as established today,  and his wife would like some information about Alzheimer support groups mailed.   I will send a staff note to jDr Inda Merlin and Elsie Lincoln can you mail her the information? Jacob [name]

## 2016-10-08 DIAGNOSIS — E538 Deficiency of other specified B group vitamins: Secondary | ICD-10-CM | POA: Diagnosis not present

## 2016-10-08 NOTE — Telephone Encounter (Signed)
I mailed pt and pt's wife information on local alzheimer's support groups.

## 2016-10-15 ENCOUNTER — Other Ambulatory Visit: Payer: Self-pay | Admitting: Neurology

## 2016-10-15 MED ORDER — MEMANTINE HCL 10 MG PO TABS: 10.0000 mg | ORAL_TABLET | Freq: Two times a day (BID) | ORAL | 1 refills | Status: DC

## 2016-10-15 NOTE — Addendum Note (Signed)
Addended by: Lester Marion A on: 10/15/2016 09:36 AM   Modules accepted: Orders

## 2016-10-15 NOTE — Telephone Encounter (Signed)
I spoke to pt's wife and advised her that the memantine RX for 10mg  BID was sent to [facility]. Pt's wife verbalized understanding.

## 2016-10-15 NOTE — Telephone Encounter (Signed)
Patient's wife is calling to get a new Rx for Namenda called to [facility] on [address] for the patient. The patient is on his last week of Namenda titration pack.

## 2016-10-22 ENCOUNTER — Ambulatory Visit (INDEPENDENT_AMBULATORY_CARE_PROVIDER_SITE_OTHER): Payer: Medicare Other | Admitting: Neurology

## 2016-10-22 ENCOUNTER — Encounter: Payer: Self-pay | Admitting: Neurology

## 2016-10-22 VITALS — BP 122/72 | HR 55 | Resp 20 | Ht 75.0 in | Wt 223.0 lb

## 2016-10-22 DIAGNOSIS — I25810 Atherosclerosis of coronary artery bypass graft(s) without angina pectoris: Secondary | ICD-10-CM

## 2016-10-22 DIAGNOSIS — G301 Alzheimer's disease with late onset: Secondary | ICD-10-CM

## 2016-10-22 DIAGNOSIS — F0280 Dementia in other diseases classified elsewhere without behavioral disturbance: Secondary | ICD-10-CM

## 2016-10-22 MED ORDER — MEMANTINE HCL 10 MG PO TABS: 10.0000 mg | ORAL_TABLET | Freq: Two times a day (BID) | ORAL | 1 refills | Status: DC

## 2016-10-22 NOTE — Progress Notes (Signed)
SLEEP MED[facility]INE CLINIC   Provider:  Larey Seat, M D  Referring Provider: Josetta Huddle, MD Primary Care Physician:  Jacob Screws, MD  Chief Complaint  [name] presents with  . Follow-up    memory, PET scan    HPI:  Jacob [name] is a 70 y.o. male , seen here as a referral  from Jacob. Inda [name] for an evaluation of neurocognitive decline.   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 He did have a borderline low vitamin B12 level in May of this year, which has been addressed and supplemented by his primary care physician, Jacob. Josetta [name]. The [name] has a history of atrial fibrillation with an INR goal of 2-3, has no history of bleeding and has no history of dietary indiscretions with vitamin K noncompliance with medication. He was tested by Jacob [name] in early spring of this year,  after having shown signs of memory loss with a slow progression over the last couple of years. I do not have the original results from Jacob [name] and available here, but the [name] indicated that he became increasingly fatigued and somewhat irritated at the length of the testing. 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 Over the past 2 years he has noted word finding difficulties to increase. He retired in 2015 and his wife had noticed and gave examples of him not being able to sometimes follow simple directions, sometimes forgetting parts of conversations from the previous day and misplacing or losing personal items. 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  His past medical history is extensive ; for 17 years his ascending aortic aneurysm has been followed, but recently an enlargement was noticed.  chronic atrial fibrillation, COPD, coronary artery disease with CABG 2 in 2010, history of factor V Leiden deficiency, gouty arthritis, hypercholesterolemia, hyperglycemia, hypertension. He had external beam proton radiation in 2006 prostatectomy 2004 reoccurrences in 2006 at 2013. He has no history of head injury, stroke like symptoms seizures or neurologic infections. I was quite surprised after today's introductory interview with the [name] that he would have scored at the lowest percentile. He was apparently unable to complete some of the Tests and subsections of tests. I do wonder if this was #1 bad day and what I see today is a very good day, or if this is actually really a sign off his increasing agitation and indications considering the length of the testing.  09-16-2016 My diagnosis is that of a progressive memory loss disorder, which also is indicated by the MRI report, showing perisylvian and moderate mesial temporal atrophy. There is some D.O. service. This is not unilateral but bilaterally fairly symmetric and I suspect that I cannot attribute this to the proton deep therapy he received. My colleague did not make it comment about the shape of the cerebellopontine but should be influenced if dementia is induced by alcohol abuse. I would also like to say that the EEG was a normal base trunk rhythm which very much surprised.  I would like to treat Jacob [name] but there is no indication of a vascular dementia. Aricept increased to 10 mg daily. and Namenda titrationpack ( Jacob [name] ) .   10-22-2016 Jacob [name] a myeloid PET brain scan was performed on 10/07/2016 and was positive for brain amyloid, presence of moderate neuritic plaques in the brain. A  positive scan indicates the presence of Alzheimer's disease. The [name] performed today very well on a Mini-Mental Status Examination was 22 out of 30 points. His troubles were in serial 7 calculation and short-term memory but he was oriented to month day and season in place, and he could follow multistep commands. He has not gotten lost, but prefers his wife to drive him. He does not drive at night, in rain and not on highway. She drives him for several years when a trip goes onto the high way.      Review of Systems: Out of a complete 14 system review, the [name] complains of only the following symptoms, and all other reviewed systems are negative. Forgetfulness.   MMSE - Mini Mental State Exam 10/22/2016 09/16/2016 08/11/2016  Orientation to time 3 0 4  Orientation to Place 4 4 2   Registration 3 3 3   Attention/ Calculation 2 1 2   Recall 1 1 0  Language- name 2 objects 2 2 2   Language- repeat 1 1 1   Language- follow 3 step command 3 2 3   Language- read & follow direction 1 1 1   Write a sentence 1 1 1   Copy design 1 1 0  Total score 22 17 19         Social History   Social History  . Marital status: Married    Spouse name: [name]  . Number of children: [name]  . Years of education: [name]   Occupational History  . Not on file.   Social History Main Topics  . Smoking status: Former Smoker    Packs/day: 1.00    Years: 15.00    Quit date: 11/24/1985  . Smokeless tobacco: Never Used  . Alcohol use No     Comment: Recovery alcoholic --  attends AA  . Drug use: No  . Sexual activity: Not on file   Other Topics Concern  . Not on file   Social History Narrative  . No narrative on file    Family History  Problem Relation Age of Onset  . Cancer Mother   . Heart attack Father     Past Medical History:  Diagnosis Date  . Adrenal adenoma    bilateral  . Ascending aortic aneurysm ([place])    monitored by Jacob Jacob Daneen Schick-  stable 4.4cm x 4.3cm per CT 06-01-2014  . At risk  for sleep apnea    STOP-BANG= 6     SENT TO PCP 03-05-2015  . Borderline diabetes mellitus   . Chronic anticoagulation   . Chronic atrial fibrillation ([place])   . Chronic diastolic heart failure ([place])   . Coronary artery disease    cardiologist-  Jacob Daneen Schick--  s/p  CABG x2 2000/   last myoview 2012-- normal  . Diabetes mellitus without complication ([place])   . Factor 5 Leiden mutation, heterozygous ([place])   . Gross hematuria   . History of adenomatous polyp of colon    2009  . History of basal cell carcinoma excision    forehead  . History of DVT of lower extremity    x2 1980's  .  History of gout   . History of prostate cancer oncologist --[facility]--  no recurrence since 2013   dx 2004--  s/p  radical retropubic prostatectomy/  recurrence 2006  and 2013 s/p radiation at [facility]  and completed Hormone tx  . Hx of radiation therapy    external beam proton radiation 2006  . Hypertension   . Left ureteral stone   . Multiple thyroid nodules    Benign bx done July 2014  . OA (osteoarthritis)   . Recovering alcoholic in remission ([place])   . S/P CABG x 2    09-17-1999  . Secondary pulmonary hypertension     Past Surgical History:  Procedure Laterality Date  . CARDIAC CATHETERIZATION  09-03-1999  Jacob Daneen Schick   Abnormal myoview/  80% mLAD,  total occlusion pRCA , collaterals from pRCA segment and left CFX and LAD,  OM1 sig. lesion,  total occlusion very small OM2,  persived LVSF,  elevated end-diastolic pressure  . CARDIOVASCULAR STRESS TEST  03-04-2011   Jacob Daneen Schick   normal perfusion study/  attenuation artifact in inferior region of myocardium,  no ischemia or infarct/scar,  unable to gate wall motion due to irregular rhythm  . COLONOSCOPY WITH PROPOFOL [name] 12/27/2013   Procedure: COLONOSCOPY WITH PROPOFOL;  Surgeon: Garlan Fair, MD;  Location: [facility];  Service: Endoscopy;  Laterality: [name];  . CORONARY ARTERY BYPASS GRAFT  09-17-1999   Jacob  gerhardt   x2 vessel--  LIMA to LAD and RCA  . CYSTOSCOPY WITH RETROGRADE PYELOGRAM, URETEROSCOPY AND STENT PLACEMENT Left 03/07/2015   Procedure: CYSTOSCOPY WITH RETROGRADE PYELOGRAM, URETEROSCOPY  WITH STONE EXTRACTION AND STENT PLACEMENT;  Surgeon: Rana Snare, MD;  Location: [facility];  Service: Urology;  Laterality: Left;  . EXCISIONAL BX OF MASS, LEFT DISTAL BRACHIUM  08-09-2002  . HEMORRHOID SURGERY  2000  . HOLMIUM LASER APPLICATION Left [id number]   Procedure: HOLMIUM LASER APPLICATION;  Surgeon: Rana Snare, MD;  Location: [facility];  Service: Urology;  Laterality: Left;  . LUMBAR DISKECOTMY AND FUSION  1972   L4 -- L5  . RADICAL RETROPUBIC PROSTATECTOMY  02-20-2003  . TONSILLECTOMY  as child  . TOTAL HIP ARTHROPLASTY Left 01/18/2013   Procedure: TOTAL HIP ARTHROPLASTY;  Surgeon: Garald Balding, MD;  Location: [facility];  Service: Orthopedics;  Laterality: Left;  Left Total Hip Arthroplasty  . TRANSTHORACIC ECHOCARDIOGRAM  05-10-2013  Jacob Daneen Schick   mild LVH/  55-60%/  severe LAE/  mild to moderate MR and TR/  mild RAE/  moderate to severe RV elevated systolic pressure/  IVC demonstates <50% collapse w/ respiration/  due to atrial fib. unable evaluate diastolic function    Current Outpatient Prescriptions  Medication Sig Dispense Refill  . carvedilol (COREG) 3.125 MG tablet Take 1 tablet (3.125 mg total) by mouth 2 (two) times daily with a meal. 180 tablet 3  . colchicine 0.6 MG tablet Take 0.6 mg by mouth 2 (two) times daily as needed (gout).     . Cyanocobalamin (B-12 COMPLIANCE INJECTION IJ) Inject as directed every 30 (thirty) days.     Marland Kitchen dutasteride (AVODART) 0.5 MG capsule Take 0.5 mg by mouth daily.    Marland Kitchen escitalopram (LEXAPRO) 10 MG tablet Take 10 mg by mouth every morning.    . folic acid (FOLVITE) 1 MG tablet Take 1 mg by mouth daily.    . furosemide (LASIX) 40 MG tablet Take 40 mg by mouth daily.    Marland Kitchen  LIVALO 4 MG TABS Take 1 tablet by  mouth daily.  0  . memantine (NAMENDA) 10 MG tablet Take 1 tablet (10 mg total) by mouth 2 (two) times daily. 60 tablet 1  . metformin (FORTAMET) 1000 MG (OSM) 24 hr tablet Take 2,000 mg by mouth 2 (two) times daily with a meal.     . Multiple Vitamins-Minerals (PRESERVISION AREDS PO) Take 1 tablet by mouth daily.    . ramipril (ALTACE) 2.5 MG capsule Take 2.5 mg by mouth daily.    Marland Kitchen warfarin (COUMADIN) 5 MG tablet Take 1-1.5 tablets (5-7.5 mg total) by mouth daily. Take 1 tablet daily except on Sundays take 1 1/2 tablet (7.5mg )     No current facility-administered medications for this visit.     Allergies as of 10/22/2016 - Review Complete 10/22/2016  Allergen Reaction Noted  . Percocet [oxycodone-acetaminophen] Other (See Comments) 01/13/2013  . Allopurinol Other (See Comments) 05/25/2014  . Dilaudid [hydromorphone hcl] Other (See Comments) 12/08/2013  . Other Other (See Comments) 01/12/2013    Vitals: BP 122/72   Pulse (!) 55   Resp 20   Ht 6\' 3"  (1.905 m)   Wt 223 lb (101.2 kg)   BMI 27.87 kg/m  Last Weight:  Wt Readings from Last 1 Encounters:  10/22/16 223 lb (101.2 kg)   [id number] mass index is 27.87 kg/m.     Last Height:   Ht Readings from Last 1 Encounters:  10/22/16 6\' 3"  (1.905 m)    Physical exam:  General: The [name] is awake, alert and appears not in acute distress. The [name] is well groomed. Head: Normocephalic, atraumatic. Neck is supple. Mallampati 3  neck circumference:17,  Nasal airflow patent , TMJ click evident . Retrognathia is not seen.  Severe neck stiffness.  Cardiovascular:  Regular rate and rhythm, without  murmurs or carotid bruit, and without distended neck veins. Respiratory: Lungs are clear to auscultation. Skin:  Without evidence of edema, or rash Trunk:  The [name]'s posture is erect .   Neurologic exam : The [name] is awake and alert, oriented to place and time.   Memory subjective  described as impaired;  He does best in the  morning.   MMSE - Mini Mental State Exam 10/22/2016 09/16/2016 08/11/2016  Orientation to time 3 0 4  Orientation to Place 4 4 2   Registration 3 3 3   Attention/ Calculation 2 1 2   Recall 1 1 0  Language- name 2 objects 2 2 2   Language- repeat 1 1 1   Language- follow 3 step command 3 2 3   Language- read & follow direction 1 1 1   Write a sentence 1 1 1   Copy design 1 1 0  Total score 22 17 19    CLINICAL DATA:  Declining memory function. Evaluate for Alzheimer's type dementia.  EXAM: NM PET METABOLIC BRAIN  TECHNIQUE: 10.9 mCi F-18 Florbetapir was injected intravenously. Full-ring PET imaging was performed from the vertex to the skull base. CT data was obtained and used for attenuation correction and anatomic localization.  COMPARISON:  Brain MRI 08/26/2016  Findings: There is increased florbetapir uptake seen in the cortical cerebral gray matter of the temporal, frontal, and parietal lobes, with these regions showing loss of the normal gray-white contrast. There is prominent demonstration of loss of gray-white differentiation in the paramedian parietal lobes. Additional prominent loss of gray-white differentiation in the temporal lobes and RIGHT frontal lobe. Areas of Preserved white matter and gray matter differentiation in the occipital lobes. The  cerebellum has no evidence of abnormal uptake with normal gray-white differentiation.  CT (for attenuation): Moderate atrophy.  No acute findings  FINDINGS: IMPRESSION:  The scan is POSITIVE for brain amyloid and is most consistent with the presence of moderate to frequent neuritic beta-amyloid plaques in the brain.  NOTE: Florbetapir is a radiopharmaceutical indicated for Positron Emission Tomography (PET) imaging of beta-amyloid neuritic plaques in the brains of adult patients with cognitive impairment being evaluated for suspected Alzheimer's disease (AD). A positive scan indicates moderate to frequent  plaques, which demonstrates the presence of AD pathology. A negative scan indicates sparse or no plaques, which is inconsistent with a diagnosis of AD. Florbetapir in the is an adjunct to other diagnostic evaluations.   Electronically Signed   By: Suzy Bouchard M.D.   On: 10/07/2016 16:36      Attention span & concentration ability appears normal in casual conversation, he can even change to the [place] language .  Speech is fluent,  no  Dysarthria, but  hoarse-dysphonia and some aphasia. Some stuttering.  Mood and affect are appropriate.  Cranial nerves: Pupils are equal and briskly reactive to light. Funduscopic exam without  evidence of pallor or edema.  Extraocular movements  in vertical and horizontal planes intact and without nystagmus. Visual fields by finger perimetry are intact. Hearing to finger rub intact.  Facial sensation intact to fine touch. Facial motor strength is symmetric and tongue and uvula move midline. Shoulder shrug was symmetrical.  Motor exam:   Normal tone, muscle bulk and symmetric strength in all extremities. Sensory:  Fine touch, pinprick and vibration were tested in all extremities. Proprioception tested in the upper extremities was normal. Coordination: Rapid alternating movements in the fingers/hands was normal. Finger-to-nose maneuver  normal without evidence of ataxia, dysmetria or tremor. Gait and station: [name] walks without assistive device and is able unassisted to climb up to the exam table. Strength within normal limits.  Stance is stable and normal. Tandem gait is fragmented. Gait is unsteady and ataxic. Turns with 4 Steps. Romberg testing is negative. Status post left hip replacement  2014 .  Deep tendon reflexes: in the  upper and lower extremities are symmetric - Babinski maneuver response is downgoing.  The [name] was advised of the nature of the diagnosed dementia disorder , the treatment options and risks for general a health and  wellness arising from not treating the condition.  I spent more than 25  minutes of face to face time with the [name] and his wife.  Greater than 50% of time was spent in counseling and coordination of care. We have discussed the diagnosis and differential and I answered the [name]'s questions.     Assessment:  After physical and neurologic examination, review of laboratory studies,  Personal review of imaging studies, reports of other /same  Imaging studies ,  Results of polysomnography/ neurophysiology testing and pre-existing records as far as provided in visit., my assessment is   1)  Moderate to severe memory decline. Alzheimers  dementia. Slowed EEG.    Plan:  Treatment plan and additional workup :   Bradycardia exacerbated - no  Aricept.  Namenda dose pack 10 mg bid. From today on.   Stay physically active, get enough sleep, plan cognitive challenging activities for the morning and physical activities for the afternoons. Moderate well-balanced diet. [employer] 3 times weekly, stay physically active.  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 Continue civic engagement and social activities.  Asencion Partridge Sammuel Blick MD  10/22/2016   CC: Jacob Huddle, Md [address] [place], [place] [address]

## 2016-10-28 DIAGNOSIS — Z7901 Long term (current) use of anticoagulants: Secondary | ICD-10-CM | POA: Diagnosis not present

## 2016-11-04 DIAGNOSIS — M109 Gout, unspecified: Secondary | ICD-10-CM | POA: Diagnosis not present

## 2016-11-04 DIAGNOSIS — I4891 Unspecified atrial fibrillation: Secondary | ICD-10-CM | POA: Diagnosis not present

## 2016-11-04 DIAGNOSIS — I519 Heart disease, unspecified: Secondary | ICD-10-CM | POA: Diagnosis not present

## 2016-11-04 DIAGNOSIS — C61 Malignant neoplasm of prostate: Secondary | ICD-10-CM | POA: Diagnosis not present

## 2016-11-04 DIAGNOSIS — G309 Alzheimer's disease, unspecified: Secondary | ICD-10-CM | POA: Diagnosis not present

## 2016-11-04 DIAGNOSIS — E785 Hyperlipidemia, unspecified: Secondary | ICD-10-CM | POA: Diagnosis not present

## 2016-11-10 DIAGNOSIS — E538 Deficiency of other specified B group vitamins: Secondary | ICD-10-CM | POA: Diagnosis not present

## 2016-11-26 DIAGNOSIS — Z7901 Long term (current) use of anticoagulants: Secondary | ICD-10-CM | POA: Diagnosis not present

## 2016-11-27 DIAGNOSIS — J069 Acute upper respiratory infection, unspecified: Secondary | ICD-10-CM | POA: Diagnosis not present

## 2016-12-04 DIAGNOSIS — R05 Cough: Secondary | ICD-10-CM | POA: Diagnosis not present

## 2016-12-17 ENCOUNTER — Ambulatory Visit: Payer: Medicare Other | Admitting: Neurology

## 2016-12-17 DIAGNOSIS — D696 Thrombocytopenia, unspecified: Secondary | ICD-10-CM | POA: Diagnosis not present

## 2016-12-17 DIAGNOSIS — I719 Aortic aneurysm of unspecified site, without rupture: Secondary | ICD-10-CM | POA: Diagnosis not present

## 2016-12-17 DIAGNOSIS — G309 Alzheimer's disease, unspecified: Secondary | ICD-10-CM | POA: Diagnosis not present

## 2016-12-17 DIAGNOSIS — C61 Malignant neoplasm of prostate: Secondary | ICD-10-CM | POA: Diagnosis not present

## 2016-12-17 DIAGNOSIS — Z5181 Encounter for therapeutic drug level monitoring: Secondary | ICD-10-CM | POA: Diagnosis not present

## 2016-12-17 DIAGNOSIS — I2510 Atherosclerotic heart disease of native coronary artery without angina pectoris: Secondary | ICD-10-CM | POA: Diagnosis not present

## 2016-12-17 DIAGNOSIS — E538 Deficiency of other specified B group vitamins: Secondary | ICD-10-CM | POA: Diagnosis not present

## 2016-12-17 DIAGNOSIS — I4891 Unspecified atrial fibrillation: Secondary | ICD-10-CM | POA: Diagnosis not present

## 2016-12-17 DIAGNOSIS — I10 Essential (primary) hypertension: Secondary | ICD-10-CM | POA: Diagnosis not present

## 2016-12-22 ENCOUNTER — Ambulatory Visit (INDEPENDENT_AMBULATORY_CARE_PROVIDER_SITE_OTHER): Payer: Medicare Other

## 2016-12-22 ENCOUNTER — Ambulatory Visit (INDEPENDENT_AMBULATORY_CARE_PROVIDER_SITE_OTHER): Payer: Medicare Other | Admitting: Podiatry

## 2016-12-22 ENCOUNTER — Telehealth: Payer: Self-pay | Admitting: *Deleted

## 2016-12-22 ENCOUNTER — Encounter: Payer: Self-pay | Admitting: Podiatry

## 2016-12-22 VITALS — BP 134/73 | HR 50 | Resp 16

## 2016-12-22 DIAGNOSIS — M7751 Other enthesopathy of right foot: Secondary | ICD-10-CM

## 2016-12-22 DIAGNOSIS — M779 Enthesopathy, unspecified: Principal | ICD-10-CM

## 2016-12-22 DIAGNOSIS — M778 Other enthesopathies, not elsewhere classified: Secondary | ICD-10-CM

## 2016-12-22 DIAGNOSIS — M79671 Pain in right foot: Secondary | ICD-10-CM

## 2016-12-22 MED ORDER — TRIAMCINOLONE ACETONIDE 10 MG/ML IJ SUSP
10.0000 mg | Freq: Once | INTRAMUSCULAR | Status: AC
  Administered 2016-12-22: 10 mg

## 2016-12-22 NOTE — Telephone Encounter (Signed)
Pt states he was in the office today and wanted to know if the foot should still be hurting. I called pt and he said, "I'm sorry, but I can't really talk to you right now" and hung up the phone.

## 2016-12-22 NOTE — Telephone Encounter (Signed)
Pt states the doctor took a lot of fluid out of his foot, and the foot still hurts. I instructed pt to wear a stiff soled shoe, that would not allow bending in the ball of the foot, and to ice the area for 15 minutes 3-4 times this evening remembering to keep a sock on to protect his foot and pick up the ice pack before walking around so as not to trip. Pt states understanding.

## 2016-12-24 NOTE — Progress Notes (Signed)
Subjective:     Patient ID: Jacob [name], male   DOB: 11-Jun-1946, 71 y.o.   MRN: [id number]  HPI patient presents stating I'm getting a lot of pain around the joint of my right foot with inflammation fluid buildup and it makes it hard for me to be ambulatory   Review of Systems     Objective:   Physical Exam Neurovascular status intact with pain around the third fourth metatarsophalangeal joints left with inflammation fluid buildup and mild elevation of the digits    Assessment:     Inflammatory capsulitis with pain    Plan:     Recommended medication today did a proximal nerve block aspirated the third MPJ getting out of small amount of clear fluid and injected with a half cc deck some some Kenalog and applied padding. Reappoint 3 weeks or earlier if needed  X-ray indicated that there is no indications of stress fracture or advanced arthritis

## 2016-12-30 DIAGNOSIS — M47816 Spondylosis without myelopathy or radiculopathy, lumbar region: Secondary | ICD-10-CM | POA: Diagnosis not present

## 2016-12-30 DIAGNOSIS — M4120 Other idiopathic scoliosis, site unspecified: Secondary | ICD-10-CM | POA: Diagnosis not present

## 2017-01-06 DIAGNOSIS — D1801 Hemangioma of skin and subcutaneous tissue: Secondary | ICD-10-CM | POA: Diagnosis not present

## 2017-01-06 DIAGNOSIS — Z85828 Personal history of other malignant neoplasm of skin: Secondary | ICD-10-CM | POA: Diagnosis not present

## 2017-01-06 DIAGNOSIS — D0439 Carcinoma in situ of skin of other parts of face: Secondary | ICD-10-CM | POA: Diagnosis not present

## 2017-01-06 DIAGNOSIS — L111 Transient acantholytic dermatosis [Grover]: Secondary | ICD-10-CM | POA: Diagnosis not present

## 2017-01-06 DIAGNOSIS — D692 Other nonthrombocytopenic purpura: Secondary | ICD-10-CM | POA: Diagnosis not present

## 2017-01-06 DIAGNOSIS — D224 Melanocytic nevi of scalp and neck: Secondary | ICD-10-CM | POA: Diagnosis not present

## 2017-01-06 DIAGNOSIS — D044 Carcinoma in situ of skin of scalp and neck: Secondary | ICD-10-CM | POA: Diagnosis not present

## 2017-01-06 DIAGNOSIS — D225 Melanocytic nevi of trunk: Secondary | ICD-10-CM | POA: Diagnosis not present

## 2017-01-06 DIAGNOSIS — L570 Actinic keratosis: Secondary | ICD-10-CM | POA: Diagnosis not present

## 2017-01-06 DIAGNOSIS — D485 Neoplasm of uncertain behavior of skin: Secondary | ICD-10-CM | POA: Diagnosis not present

## 2017-01-06 DIAGNOSIS — L821 Other seborrheic keratosis: Secondary | ICD-10-CM | POA: Diagnosis not present

## 2017-01-12 ENCOUNTER — Telehealth (INDEPENDENT_AMBULATORY_CARE_PROVIDER_SITE_OTHER): Payer: Self-pay | Admitting: Orthopaedic Surgery

## 2017-01-12 ENCOUNTER — Ambulatory Visit (INDEPENDENT_AMBULATORY_CARE_PROVIDER_SITE_OTHER): Payer: Medicare Other | Admitting: Podiatry

## 2017-01-12 ENCOUNTER — Encounter: Payer: Self-pay | Admitting: Podiatry

## 2017-01-12 DIAGNOSIS — M7751 Other enthesopathy of right foot: Secondary | ICD-10-CM

## 2017-01-12 DIAGNOSIS — M778 Other enthesopathies, not elsewhere classified: Secondary | ICD-10-CM

## 2017-01-12 DIAGNOSIS — M779 Enthesopathy, unspecified: Principal | ICD-10-CM

## 2017-01-12 NOTE — Progress Notes (Signed)
Subjective:     Patient ID: Jacob [name], male   DOB: [birth year], 71 y.o.   MRN: [id number]  HPI patient presents stating that he is doing much better with his right foot   Review of Systems     Objective:   Physical Exam Neurovascular status intact with inflammation pain right second metatarsal that's improved and is no longer tender when palpated    Assessment:     Improved capsulitis right    Plan:     Advised on physical therapy anti-inflammatory and support therapy and instructed on continued not going barefoot. Reappoint to recheck as needed spent a great deal time going over problem and solutions

## 2017-01-12 NOTE — Telephone Encounter (Signed)
done

## 2017-01-12 NOTE — Telephone Encounter (Signed)
Patient called and wanted to know if he still needs to take the antibiotics before he get a cleaning at the dentist.  His dentist appointment is tomorrow.  Cb#[phone].  Thank you.

## 2017-01-13 NOTE — Telephone Encounter (Signed)
Front desk handled this call

## 2017-01-19 DIAGNOSIS — I4891 Unspecified atrial fibrillation: Secondary | ICD-10-CM | POA: Diagnosis not present

## 2017-01-19 DIAGNOSIS — I719 Aortic aneurysm of unspecified site, without rupture: Secondary | ICD-10-CM | POA: Diagnosis not present

## 2017-01-19 DIAGNOSIS — Z125 Encounter for screening for malignant neoplasm of prostate: Secondary | ICD-10-CM | POA: Diagnosis not present

## 2017-01-19 DIAGNOSIS — Z5181 Encounter for therapeutic drug level monitoring: Secondary | ICD-10-CM | POA: Diagnosis not present

## 2017-01-19 DIAGNOSIS — I2510 Atherosclerotic heart disease of native coronary artery without angina pectoris: Secondary | ICD-10-CM | POA: Diagnosis not present

## 2017-01-19 DIAGNOSIS — Z7901 Long term (current) use of anticoagulants: Secondary | ICD-10-CM | POA: Diagnosis not present

## 2017-01-19 DIAGNOSIS — D696 Thrombocytopenia, unspecified: Secondary | ICD-10-CM | POA: Diagnosis not present

## 2017-01-19 DIAGNOSIS — G309 Alzheimer's disease, unspecified: Secondary | ICD-10-CM | POA: Diagnosis not present

## 2017-01-19 DIAGNOSIS — I10 Essential (primary) hypertension: Secondary | ICD-10-CM | POA: Diagnosis not present

## 2017-01-19 DIAGNOSIS — E538 Deficiency of other specified B group vitamins: Secondary | ICD-10-CM | POA: Diagnosis not present

## 2017-01-22 DIAGNOSIS — Z85828 Personal history of other malignant neoplasm of skin: Secondary | ICD-10-CM | POA: Diagnosis not present

## 2017-01-22 DIAGNOSIS — D044 Carcinoma in situ of skin of scalp and neck: Secondary | ICD-10-CM | POA: Diagnosis not present

## 2017-01-22 DIAGNOSIS — D485 Neoplasm of uncertain behavior of skin: Secondary | ICD-10-CM | POA: Diagnosis not present

## 2017-01-22 DIAGNOSIS — L988 Other specified disorders of the skin and subcutaneous tissue: Secondary | ICD-10-CM | POA: Diagnosis not present

## 2017-02-02 ENCOUNTER — Encounter (HOSPITAL_COMMUNITY): Payer: Self-pay | Admitting: Emergency Medicine

## 2017-02-02 ENCOUNTER — Emergency Department (HOSPITAL_COMMUNITY): Payer: Medicare Other

## 2017-02-02 ENCOUNTER — Emergency Department (HOSPITAL_COMMUNITY)
Admission: EM | Admit: 2017-02-02 | Discharge: 2017-02-02 | Disposition: A | Discharge status: Home / Self Care | Payer: Medicare Other | Attending: Emergency Medicine | Admitting: Emergency Medicine

## 2017-02-02 DIAGNOSIS — R079 Chest pain, unspecified: Secondary | ICD-10-CM | POA: Diagnosis not present

## 2017-02-02 DIAGNOSIS — Z8546 Personal history of malignant neoplasm of prostate: Secondary | ICD-10-CM | POA: Diagnosis not present

## 2017-02-02 DIAGNOSIS — I110 Hypertensive heart disease with heart failure: Secondary | ICD-10-CM | POA: Insufficient documentation

## 2017-02-02 DIAGNOSIS — R0789 Other chest pain: Secondary | ICD-10-CM

## 2017-02-02 DIAGNOSIS — Z79899 Other long term (current) drug therapy: Secondary | ICD-10-CM | POA: Diagnosis not present

## 2017-02-02 DIAGNOSIS — Z7901 Long term (current) use of anticoagulants: Secondary | ICD-10-CM | POA: Insufficient documentation

## 2017-02-02 DIAGNOSIS — I5032 Chronic diastolic (congestive) heart failure: Secondary | ICD-10-CM | POA: Diagnosis not present

## 2017-02-02 DIAGNOSIS — J449 Chronic obstructive pulmonary disease, unspecified: Secondary | ICD-10-CM | POA: Diagnosis not present

## 2017-02-02 DIAGNOSIS — Z951 Presence of aortocoronary bypass graft: Secondary | ICD-10-CM | POA: Diagnosis not present

## 2017-02-02 DIAGNOSIS — I2510 Atherosclerotic heart disease of native coronary artery without angina pectoris: Secondary | ICD-10-CM | POA: Diagnosis not present

## 2017-02-02 DIAGNOSIS — Z85828 Personal history of other malignant neoplasm of skin: Secondary | ICD-10-CM | POA: Insufficient documentation

## 2017-02-02 DIAGNOSIS — E119 Type 2 diabetes mellitus without complications: Secondary | ICD-10-CM | POA: Diagnosis not present

## 2017-02-02 DIAGNOSIS — Z96642 Presence of left artificial hip joint: Secondary | ICD-10-CM | POA: Diagnosis not present

## 2017-02-02 LAB — CBC
HCT: 39.8 % (ref 39.0–52.0)
Hemoglobin: 13 g/dL (ref 13.0–17.0)
MCH: 28.2 pg (ref 26.0–34.0)
MCHC: 32.7 g/dL (ref 30.0–36.0)
MCV: 86.3 fL (ref 78.0–100.0)
Platelets: 88 10*3/uL — ABNORMAL LOW (ref 150–400)
RBC: 4.61 MIL/uL (ref 4.22–5.81)
RDW: 15.9 % — ABNORMAL HIGH (ref 11.5–15.5)
WBC: 4.5 10*3/uL (ref 4.0–10.5)

## 2017-02-02 LAB — COMPREHENSIVE METABOLIC PANEL
ALBUMIN: 3.7 g/dL (ref 3.5–5.0)
ALK PHOS: 70 U/L (ref 38–126)
ALT: 15 U/L — AB (ref 17–63)
ANION GAP: 7 (ref 5–15)
AST: 27 U/L (ref 15–41)
BILIRUBIN TOTAL: 0.8 mg/dL (ref 0.3–1.2)
BUN: 22 mg/dL — AB (ref 6–20)
CALCIUM: 9 mg/dL (ref 8.9–10.3)
CO2: 26 mmol/L (ref 22–32)
CREATININE: 1.18 mg/dL (ref 0.61–1.24)
Chloride: 107 mmol/L (ref 101–111)
GFR calc Af Amer: >60 mL/min (ref >60)
GFR calc non Af Amer: 60 mL/min — ABNORMAL LOW (ref >60)
GLUCOSE: 117 mg/dL — AB (ref 65–99)
Potassium: 4.3 mmol/L (ref 3.5–5.1)
Sodium: 140 mmol/L (ref 135–145)
TOTAL PROTEIN: 5.5 g/dL — AB (ref 6.5–8.1)

## 2017-02-02 LAB — PROTIME-INR
INR: 1.94
Prothrombin Time: 22.4 seconds — ABNORMAL HIGH (ref 11.4–15.2)

## 2017-02-02 LAB — MAGNESIUM: Magnesium: 2.1 mg/dL (ref 1.7–2.4)

## 2017-02-02 LAB — I-STAT TROPONIN, ED: Troponin i, poc: 0 ng/mL (ref 0.00–0.08)

## 2017-02-02 MED ORDER — ASPIRIN 81 MG PO CHEW: 324.0000 mg | CHEWABLE_TABLET | Freq: Once | ORAL | Status: DC

## 2017-02-02 NOTE — ED Provider Notes (Signed)
[facility] DEPT Provider Note   CSN: [id number] Arrival date & time: 02/02/17  1412     History   Chief Complaint Chief Complaint  Patient presents with  . Chest Pain    HPI Jacob [name] is a 71 y.o. male who presents to ED with chief complaint chest pain. He states he experienced mild 2/10 right sided chest pain around the 4th intercostal space for a few minutes yesterday, and then again today. He states the area was mildly tender to touch and painful with deep inspiration. Both episodes resolved on their own shortly without need for medication, and pt denies radiation of pain or associated diaphoresis, dizziness, SOB. He has had prior MI before and states this pain is different from that experience. He reports not being in any pain at this time. Denies recent travel, hemoptysis, or surgery. 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   Denies HA, fever/chills, abd pain, n/v/d, melena, hematuria, dysuria.  The history is provided by the patient.    Past Medical History:  Diagnosis Date  . Adrenal adenoma    bilateral  . Ascending aortic aneurysm ([place])    monitored by dr dr Daneen Schick-  stable 4.4cm x 4.3cm per CT 06-01-2014  . At risk for sleep apnea    STOP-BANG= 6     SENT TO PCP 03-05-2015  . Borderline diabetes mellitus   . Chronic anticoagulation   . Chronic atrial fibrillation ([place])   . Chronic diastolic heart failure ([place])   . Coronary artery disease    cardiologist-  dr Daneen Schick--  s/p  CABG x2 2000/   last myoview 2012-- normal  . Diabetes mellitus without complication ([place])   . Factor 5 Leiden mutation, heterozygous ([place])   . Gross hematuria   . History of adenomatous polyp of colon    2009  . History of basal cell carcinoma excision    forehead  . History of DVT of lower extremity    x2 1980's  . History of gout   . History of prostate cancer oncologist --dr Rafael Bihari  [facility]--  no recurrence since 2013   dx 2004--  s/p  radical retropubic prostatectomy/  recurrence 2006  and 2013 s/p radiation at [facility]  and completed Hormone tx  . Hx of radiation therapy    external beam proton radiation 2006  . Hypertension   . Left ureteral stone   . Multiple thyroid nodules    Benign bx done July 2014  . OA (osteoarthritis)   . Recovering alcoholic in remission ([place])   . S/P CABG x 2    09-17-1999  . Secondary pulmonary hypertension     Patient Active Problem List   Diagnosis Date Noted  . 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 Alcohol dependence with uncomplicated withdrawal ([place]) 08/11/2016  . Ascending aortic aneurysm ([place]) 05/29/2014  . Chronic diastolic heart failure ([place]) 05/29/2014  . Pulmonary hypertension, secondary 05/25/2014  . Essential hypertension, benign 05/25/2014  . Hepatomegaly 05/25/2014  . CAD (coronary artery disease) of artery bypass graft 05/25/2014  . COPD (chronic obstructive pulmonary disease) ([place]) 05/25/2014  . Hyperglycemia 05/25/2014  . Hematoma 07/23/2013  . Near syncope 07/23/2013  . Neurologic cardiac syncope 01/20/2013    Class: Chronic  . Atrial fibrillation ([place]) 01/20/2013  . Factor 5 Leiden mutation,  heterozygous ([place]) 01/20/2013  . Osteoarthritis of left hip 01/18/2013    Past Surgical History:  Procedure Laterality Date  . CARDIAC CATHETERIZATION  09-03-1999  dr Daneen Schick   Abnormal myoview/  80% mLAD,  total occlusion pRCA , collaterals from pRCA segment and left CFX and LAD,  OM1 sig. lesion,  total occlusion very small OM2,  persived LVSF,  elevated end-diastolic pressure  . CARDIOVASCULAR STRESS TEST  03-04-2011   dr Daneen Schick   normal perfusion study/  attenuation artifact in inferior region of myocardium,  no ischemia or infarct/scar,  unable to gate wall motion due to irregular rhythm  . COLONOSCOPY  WITH PROPOFOL [name] 12/27/2013   Procedure: COLONOSCOPY WITH PROPOFOL;  Surgeon: Garlan Fair, MD;  Location: [facility];  Service: Endoscopy;  Laterality: [name];  . CORONARY ARTERY BYPASS GRAFT  09-17-1999   dr gerhardt   x2 vessel--  LIMA to LAD and RCA  . CYSTOSCOPY WITH RETROGRADE PYELOGRAM, URETEROSCOPY AND STENT PLACEMENT Left 03/07/2015   Procedure: CYSTOSCOPY WITH RETROGRADE PYELOGRAM, URETEROSCOPY  WITH STONE EXTRACTION AND STENT PLACEMENT;  Surgeon: Rana Snare, MD;  Location: [facility];  Service: Urology;  Laterality: Left;  . EXCISIONAL BX OF MASS, LEFT DISTAL BRACHIUM  08-09-2002  . HEMORRHOID SURGERY  2000  . HOLMIUM LASER APPLICATION Left 0/62/6948   Procedure: HOLMIUM LASER APPLICATION;  Surgeon: Rana Snare, MD;  Location: [facility];  Service: Urology;  Laterality: Left;  . LUMBAR DISKECOTMY AND FUSION  1972   L4 -- L5  . RADICAL RETROPUBIC PROSTATECTOMY  02-20-2003  . TONSILLECTOMY  as child  . TOTAL HIP ARTHROPLASTY Left 01/18/2013   Procedure: TOTAL HIP ARTHROPLASTY;  Surgeon: Garald Balding, MD;  Location: [facility];  Service: Orthopedics;  Laterality: Left;  Left Total Hip Arthroplasty  . TRANSTHORACIC ECHOCARDIOGRAM  05-10-2013  dr Daneen Schick   mild LVH/  55-60%/  severe LAE/  mild to moderate MR and TR/  mild RAE/  moderate to severe RV elevated systolic pressure/  IVC demonstates <50% collapse w/ respiration/  due to atrial fib. unable evaluate diastolic function       Home Medications    Prior to Admission medications   Medication Sig Start Date End Date Taking? Authorizing Provider  carvedilol (COREG) 3.125 MG tablet Take 1 tablet (3.125 mg total) by mouth 2 (two) times daily with a meal. 09/22/16  Yes Belva Crome, MD  colchicine 0.6 MG tablet Take 0.6 mg by mouth 2 (two) times daily as needed (gout).    Yes Historical Provider, MD  Cyanocobalamin (B-12 COMPLIANCE INJECTION IJ) Inject as directed every 30 (thirty) days.     Yes Historical Provider, MD  dutasteride (AVODART) 0.5 MG capsule Take 0.5 mg by mouth daily.   Yes Historical Provider, MD  escitalopram (LEXAPRO) 10 MG tablet Take 10 mg by mouth every morning.   Yes Historical Provider, MD  folic acid (FOLVITE) 1 MG tablet Take 1 mg by mouth daily.   Yes Historical Provider, MD  furosemide (LASIX) 40 MG tablet Take 40 mg by mouth daily.   Yes Historical Provider, MD  memantine (NAMENDA) 10 MG tablet Take 1 tablet (10 mg total) by mouth 2 (two) times daily. 10/22/16  Yes Carmen Dohmeier, MD  metformin (FORTAMET) 1000 MG (OSM) 24 hr tablet Take 2,000 mg by mouth 2 (two) times daily with a meal.    Yes Historical Provider, MD  Multiple Vitamins-Minerals (PRESERVISION AREDS PO) Take 1 tablet by mouth  daily.   Yes Historical Provider, MD  ramipril (ALTACE) 2.5 MG capsule Take 2.5 mg by mouth daily.   Yes Historical Provider, MD  warfarin (COUMADIN) 5 MG tablet Take 1-1.5 tablets (5-7.5 mg total) by mouth daily. Take 1 tablet daily except on Sundays take 1 1/2 tablet (7.5mg ) 06/05/15  Yes Belva Crome, MD  LIVALO 4 MG TABS Take 1 tablet by mouth daily. 06/25/16   Historical Provider, MD    Family History Family History  Problem Relation Age of Onset  . Cancer Mother   . Heart attack Father     Social History Social History  Substance Use Topics  . Smoking status: Former Smoker    Packs/day: 1.00    Years: 15.00    Quit date: 11/24/1985  . Smokeless tobacco: Never Used  . Alcohol use No     Comment: Recovery alcoholic --  attends AA     Allergies   Percocet [oxycodone-acetaminophen]; Allopurinol; Dilaudid [hydromorphone hcl]; and Other   Review of Systems Review of Systems  Constitutional: Negative for chills, diaphoresis and fever.  HENT: Negative for congestion.   Respiratory: Negative for shortness of breath.   Cardiovascular: Positive for chest pain. Negative for palpitations and leg swelling.  Gastrointestinal: Negative for abdominal  distention, abdominal pain, blood in stool, constipation, diarrhea, nausea and vomiting.  Genitourinary: Negative for dysuria and hematuria.  Musculoskeletal: Negative for arthralgias and myalgias.  Neurological: Negative for dizziness, syncope and headaches.  Psychiatric/Behavioral: Negative for agitation and confusion.     Physical Exam Updated Vital Signs BP 142/75 (BP Location: Right Arm)   Pulse 64   Resp 20   SpO2 98%   Physical Exam  Constitutional: He is oriented to person, place, and time. He appears well-developed and well-nourished. No distress.  HENT:  Head: Normocephalic and atraumatic.  Eyes: Conjunctivae are normal. Right eye exhibits no discharge. Left eye exhibits no discharge. No scleral icterus.  Neck: Neck supple. No JVD present. No tracheal deviation present. No thyromegaly present.  Cardiovascular: Intact distal pulses.   Irregularly irregular rate and rhythm, No TTP of chest wall, no CP with deep inspiration, Negative homans, no LE edema noted  Pulmonary/Chest: Effort normal and breath sounds normal. He has no wheezes.  Abdominal: Soft. He exhibits no distension. There is no tenderness.  Musculoskeletal: He exhibits no edema, tenderness or deformity.  Neurological: He is alert and oriented to person, place, and time.  Skin: Skin is warm and dry. Capillary refill takes less than 2 seconds. He is not diaphoretic.  2cm surgical incision on abdomen, healing well with no sign of infection or dehiscence   Psychiatric: He has a normal mood and affect. His behavior is normal. Judgment and thought content normal.     ED Treatments / Results  Labs (all labs ordered are listed, but only abnormal results are displayed) Labs Reviewed  CBC - Abnormal; Notable for the following:       Result Value   RDW 15.9 (*)    Platelets 88 (*)    All other components within normal limits  COMPREHENSIVE METABOLIC PANEL - Abnormal; Notable for the following:    Glucose, Bld 117  (*)    BUN 22 (*)    Total Protein 5.5 (*)    ALT 15 (*)    GFR calc non Af Amer 60 (*)    All other components within normal limits  PROTIME-INR - Abnormal; Notable for the following:    Prothrombin Time 22.4 (*)  All other components within normal limits  MAGNESIUM  I-STAT TROPOININ, ED    EKG  EKG Interpretation  Date/Time:  Monday February 02 2017 14:12:48 EDT Ventricular Rate:  63 PR Interval:    QRS Duration: 88 QT Interval:  414 QTC Calculation: 423 R Axis:   36 Text Interpretation:  Atrial fibrillation Cannot rule out Anterior infarct , age undetermined Abnormal ekg Confirmed by Carmin Muskrat  MD [phone]) on 02/02/2017 2:20:46 PM       Radiology Dg Chest 2 View  Result Date: 02/02/2017 CLINICAL DATA:  Right upper chest pain since yesterday. EXAM: CHEST  2 VIEW COMPARISON:  CT chest 07/25/2016.  PA and lateral chest 07/22/2013. FINDINGS: There is cardiomegaly without edema. The patient is status post CABG. Lungs are clear. No pneumothorax or pleural effusion. Aortic atherosclerosis is seen. Remote compression fractures in the mid thoracic spine and at the thoracolumbar junction are noted and seen on the comparison plain films. IMPRESSION: Cardiomegaly without acute disease. Electronically Signed   By: Inge Rise M.D.   On: 02/02/2017 14:46    Procedures Procedures (including critical care time)  Medications Ordered in ED Medications  aspirin chewable tablet 324 mg (not administered)     Initial Impression / Assessment and Plan / ED Course  I have reviewed the triage vital signs and the nursing notes.  Pertinent labs & imaging results that were available during my care of the patient were reviewed by me and considered in my medical decision making (see chart for details).     70yom presents to ED with history of mild transient chest pain localized to right 4th ICS. Pt had one episode yesterday and another today while at rest, both of which resolved within  minutes of onset. TTP of chest at the time per pt but no pain or tenderness at this time. Low suspicion of PE due to stable vital signs and lack of risk factors - no recent periods of immobilization, pt anticoagulated with warfarin which he takes at prescribed. CXR shows known cardiomegaly (pt's cardiologist is monitoring thoracic aneurysm) without acute disease. Troponin negative, remainder of labs unremarkable. Given atypical presentation of chest pain, unlikely ACS. Potentially musculoskeletal or inflammatory in etiology, but no further emergent workup required. Discussed the benefit of OTC tylenol if an episode like this were to recur and recommend f/u with pt's cardiologist or PCP within 1 week. Also discussed indications to return to ED including worsening and continued CP, development of fever/chills, SOB and hemoptysis.   Pt seen and evaluated by Dr. Vanita Panda.   Final Clinical Impressions(s) / ED Diagnoses   Final diagnoses:  Atypical chest pain    New Prescriptions New Prescriptions   No medications on file     Renita Papa, [place] 02/02/17 [place], MD 02/03/17 1531

## 2017-02-02 NOTE — ED Notes (Signed)
Walked patient to the bedroom patient  did well  

## 2017-02-02 NOTE — ED Notes (Signed)
Hooked patient back up to the monitor after bathroom 

## 2017-02-02 NOTE — ED Triage Notes (Signed)
Patient comes from home with [facility] complaining of upper right-sided chest discomfort.  Patient has history of CABG, states the discomfort is unlike chest pain he has felt before.  Patient is alert and oriented and in no apparent distress at this time.  20g saline lock in left hand.

## 2017-02-11 ENCOUNTER — Other Ambulatory Visit: Payer: Self-pay | Admitting: Neurology

## 2017-02-12 DIAGNOSIS — M4120 Other idiopathic scoliosis, site unspecified: Secondary | ICD-10-CM | POA: Diagnosis not present

## 2017-02-19 DIAGNOSIS — Z7901 Long term (current) use of anticoagulants: Secondary | ICD-10-CM | POA: Diagnosis not present

## 2017-02-19 DIAGNOSIS — E538 Deficiency of other specified B group vitamins: Secondary | ICD-10-CM | POA: Diagnosis not present

## 2017-02-24 DIAGNOSIS — M25551 Pain in right hip: Secondary | ICD-10-CM | POA: Diagnosis not present

## 2017-02-24 DIAGNOSIS — M7071 Other bursitis of hip, right hip: Secondary | ICD-10-CM | POA: Diagnosis not present

## 2017-02-26 DIAGNOSIS — M25551 Pain in right hip: Secondary | ICD-10-CM | POA: Diagnosis not present

## 2017-02-26 DIAGNOSIS — M7071 Other bursitis of hip, right hip: Secondary | ICD-10-CM | POA: Diagnosis not present

## 2017-03-02 DIAGNOSIS — M25551 Pain in right hip: Secondary | ICD-10-CM | POA: Diagnosis not present

## 2017-03-02 DIAGNOSIS — M7071 Other bursitis of hip, right hip: Secondary | ICD-10-CM | POA: Diagnosis not present

## 2017-03-05 DIAGNOSIS — M25551 Pain in right hip: Secondary | ICD-10-CM | POA: Diagnosis not present

## 2017-03-05 DIAGNOSIS — M7071 Other bursitis of hip, right hip: Secondary | ICD-10-CM | POA: Diagnosis not present

## 2017-03-10 ENCOUNTER — Ambulatory Visit: Payer: Medicare Other | Admitting: Neurology

## 2017-03-11 ENCOUNTER — Telehealth: Payer: Self-pay

## 2017-03-11 DIAGNOSIS — M25551 Pain in right hip: Secondary | ICD-10-CM | POA: Diagnosis not present

## 2017-03-11 DIAGNOSIS — M7071 Other bursitis of hip, right hip: Secondary | ICD-10-CM | POA: Diagnosis not present

## 2017-03-11 NOTE — Telephone Encounter (Signed)
I spoke to wife and r/s pt's appt to 5/22 due to adverse weather.

## 2017-03-18 DIAGNOSIS — M7071 Other bursitis of hip, right hip: Secondary | ICD-10-CM | POA: Diagnosis not present

## 2017-03-18 DIAGNOSIS — M25551 Pain in right hip: Secondary | ICD-10-CM | POA: Diagnosis not present

## 2017-03-19 DIAGNOSIS — I10 Essential (primary) hypertension: Secondary | ICD-10-CM | POA: Diagnosis not present

## 2017-03-19 DIAGNOSIS — E538 Deficiency of other specified B group vitamins: Secondary | ICD-10-CM | POA: Diagnosis not present

## 2017-03-19 DIAGNOSIS — G309 Alzheimer's disease, unspecified: Secondary | ICD-10-CM | POA: Diagnosis not present

## 2017-03-19 DIAGNOSIS — Z0000 Encounter for general adult medical examination without abnormal findings: Secondary | ICD-10-CM | POA: Diagnosis not present

## 2017-03-19 DIAGNOSIS — Z7901 Long term (current) use of anticoagulants: Secondary | ICD-10-CM | POA: Diagnosis not present

## 2017-03-19 DIAGNOSIS — I4891 Unspecified atrial fibrillation: Secondary | ICD-10-CM | POA: Diagnosis not present

## 2017-03-23 ENCOUNTER — Ambulatory Visit: Payer: Medicare Other | Admitting: Neurology

## 2017-03-23 DIAGNOSIS — E538 Deficiency of other specified B group vitamins: Secondary | ICD-10-CM | POA: Diagnosis not present

## 2017-03-23 DIAGNOSIS — M25551 Pain in right hip: Secondary | ICD-10-CM | POA: Diagnosis not present

## 2017-03-23 DIAGNOSIS — M7071 Other bursitis of hip, right hip: Secondary | ICD-10-CM | POA: Diagnosis not present

## 2017-04-09 ENCOUNTER — Other Ambulatory Visit: Payer: Self-pay | Admitting: *Deleted

## 2017-04-09 DIAGNOSIS — I712 Thoracic aortic aneurysm, without rupture, unspecified: Secondary | ICD-10-CM

## 2017-04-14 ENCOUNTER — Encounter: Payer: Self-pay | Admitting: Neurology

## 2017-04-14 ENCOUNTER — Ambulatory Visit (INDEPENDENT_AMBULATORY_CARE_PROVIDER_SITE_OTHER): Payer: Medicare Other | Admitting: Neurology

## 2017-04-14 ENCOUNTER — Encounter (INDEPENDENT_AMBULATORY_CARE_PROVIDER_SITE_OTHER): Payer: Self-pay

## 2017-04-14 VITALS — BP 141/77 | HR 57 | Ht 75.0 in | Wt 213.0 lb

## 2017-04-14 DIAGNOSIS — F0280 Dementia in other diseases classified elsewhere without behavioral disturbance: Secondary | ICD-10-CM | POA: Diagnosis not present

## 2017-04-14 DIAGNOSIS — G308 Other Alzheimer's disease: Secondary | ICD-10-CM | POA: Diagnosis not present

## 2017-04-14 NOTE — Progress Notes (Signed)
[facility]   Provider:  Larey Seat, M D  Referring Provider: Josetta Huddle, MD Primary Care Physician:  Josetta Huddle, MD  Chief Complaint  Patient presents with  . Follow-up    memory    HPI:  Jacob [name] is a 71 y.o. male , seen here as a referral  from [name] for an evaluation of neurocognitive decline.   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 He did have a borderline low vitamin B12 level in May of this year, which has been addressed and supplemented by his primary care physician, Dr. Josetta Huddle. The patient has a history of atrial fibrillation with an INR goal of 2-3, has no history of bleeding and has no history of dietary indiscretions with vitamin K noncompliance with medication. He was tested by [name] in early spring of this year, after having shown signs of memory loss with a slow progression over the last couple of years. I do not have the original results from [name] and available here, but the patient indicated that he became increasingly fatigued and somewhat irritated at the length of the testing. The patient saw 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 Over the past 2 years he has noted word finding difficulties to increase. He retired in 2015 and his wife had noticed and gave examples of him not being able to sometimes follow simple directions, sometimes forgetting parts of conversations from the previous day and misplacing or losing personal items. 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  His past medical history is extensive ; for 17 years his ascending aortic aneurysm has been followed, but recently an enlargement was noticed.  chronic atrial fibrillation, COPD, coronary artery disease with CABG 2 in 2010, history of factor V Leiden deficiency, gouty arthritis, hypercholesterolemia, hyperglycemia, hypertension. He had external beam proton radiation in 2006 prostatectomy 2004 reoccurrences in 2006 at 2013. He has no history of head injury, stroke like symptoms seizures or neurologic infections. I was quite surprised after today's introductory interview with the patient that he would have scored at the lowest percentile. He was apparently unable to complete some of the Tests and subsections of tests. I do wonder if this was #1 bad day and what I see today is a very good day, or if this is actually really a sign off his increasing agitation and indications considering the length of the testing.  09-16-2016 My diagnosis is that of a progressive memory loss disorder, which also is indicated by the MRI report, showing perisylvian and moderate mesial temporal atrophy. There is some D.O. service. This is not unilateral but bilaterally fairly symmetric and I suspect that I cannot attribute this to the proton deep therapy he received. My colleague did not make it comment about the shape of the cerebellopontine but should be influenced if dementia is induced by alcohol abuse. I would also like to say that the EEG was a normal base trunk rhythm which very much surprised. I would like  to treat Jacob [name] is an Alzheimer patient but there is no indication of a vascular dementia. Aricept increased to 10 mg daily. and Namenda titrationpack ( Dr Marcellus Scott ) .   10-22-2016 Jacob [name] a myeloid PET brain scan was performed on 10/07/2016 and was positive for brain amyloid, presence of moderate neuritic plaques in the brain. A positive scan  indicates the presence of Alzheimer's disease. The patient performed today very well on a Mini-Mental Status Examination was 22 out of 30 points. His troubles were in serial 7 calculation and short-term memory but he was oriented to month day and season in place, and he could follow multistep commands. He has not gotten lost, but prefers his wife to drive him. He does not drive at night, in rain and not on highway. She drives him for several years when a trip goes onto the high way.   Interval history from 04/14/2017. I have the pleasure of seeing Jacob [name] today for revisit. They had a good holiday season and recently hosted a law school reunion. They report that it was great fun. He has more problems finding words. 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 Since the patient could not tolerate Aricept he was only treated with Namenda. He seems to tolerate this very well.   Review of Systems: Out of a complete 14 system review, the patient complains of only the following symptoms, and all other reviewed systems are negative. Forgetfulness.   MMSE - Mini Mental State Exam 04/14/2017 10/22/2016 09/16/2016  Orientation to time 2 3 0  Orientation to Place 4 4 4   Registration 3 3 3   Attention/ Calculation 0 2 1  Recall 2 1 1   Language- name 2 objects 2 2 2   Language- repeat 1 1 1   Language- follow 3 step command 3 3 2   Language- read & follow direction 1 1 1   Write a sentence 1 1 1   Copy design 0 1 1  Total score 19 22 17       Social History   Social History  . Marital status: Married    Spouse name: [name]  . Number of children: [name]  . Years of education: [name]   Occupational History  . Not on file.   Social History Main Topics  . Smoking status: Former Smoker    Packs/day: 1.00    Years: 15.00    Quit date: 11/24/1985  . Smokeless tobacco: Never Used  . Alcohol use No     Comment: Recovery alcoholic --  attends AA  . Drug use: No    . Sexual activity: Not on file   Other Topics Concern  . Not on file   Social History Narrative  . No narrative on file    Family History  Problem Relation Age of Onset  . Cancer Mother   . Heart attack Father     Past Medical History:  Diagnosis Date  . Adrenal adenoma    bilateral  . Ascending aortic aneurysm ([place])    monitored by dr dr Daneen Schick-  stable 4.4cm x 4.3cm per CT 06-01-2014  . At risk for sleep apnea    STOP-BANG= 6     SENT TO PCP 03-05-2015  . Borderline diabetes mellitus   . Chronic anticoagulation   . Chronic atrial fibrillation ([place])   . Chronic diastolic heart failure ([place])   . Coronary artery disease    cardiologist-  dr Mallie Mussel  [facility]--  s/p  CABG x2 2000/   last myoview 2012-- normal  . Diabetes mellitus without complication ([place])   . Factor 5 Leiden mutation, heterozygous ([place])   . Gross hematuria   . History of adenomatous polyp of colon    2009  . History of basal cell carcinoma excision    forehead  . History of DVT of lower extremity    x2 1980's  . History of gout   . History of prostate cancer oncologist --dr Rafael Bihari [facility]--  no recurrence since 2013   dx 2004--  s/p  radical retropubic prostatectomy/  recurrence 2006  and 2013 s/p radiation at [facility]  and completed Hormone tx  . Hx of radiation therapy    external beam proton radiation 2006  . Hypertension   . Left ureteral stone   . Multiple thyroid nodules    Benign bx done July 2014  . OA (osteoarthritis)   . Recovering alcoholic in remission ([place])   . S/P CABG x 2    09-17-1999  . Secondary pulmonary hypertension     Past Surgical History:  Procedure Laterality Date  . CARDIAC CATHETERIZATION  09-03-1999  dr Daneen Schick   Abnormal myoview/  80% mLAD,  total occlusion pRCA , collaterals from pRCA segment and left CFX and LAD,  OM1 sig. lesion,  total occlusion very small OM2,  persived LVSF,  elevated end-diastolic pressure  . CARDIOVASCULAR STRESS TEST   03-04-2011   dr Daneen Schick   normal perfusion study/  attenuation artifact in inferior region of myocardium,  no ischemia or infarct/scar,  unable to gate wall motion due to irregular rhythm  . COLONOSCOPY WITH PROPOFOL [name] 12/27/2013   Procedure: COLONOSCOPY WITH PROPOFOL;  Surgeon: Garlan Fair, MD;  Location: [facility];  Service: Endoscopy;  Laterality: [name];  . CORONARY ARTERY BYPASS GRAFT  09-17-1999   dr gerhardt   x2 vessel--  LIMA to LAD and RCA  . CYSTOSCOPY WITH RETROGRADE PYELOGRAM, URETEROSCOPY AND STENT PLACEMENT Left 03/07/2015   Procedure: CYSTOSCOPY WITH RETROGRADE PYELOGRAM, URETEROSCOPY  WITH STONE EXTRACTION AND STENT PLACEMENT;  Surgeon: Rana Snare, MD;  Location[facility];  Service: Urology;  Laterality: Left;  . EXCISIONAL BX OF MASS, LEFT DISTAL BRACHIUM  08-09-2002  . HEMORRHOID SURGERY  2000  . HOLMIUM LASER APPLICATION Left 7/65/4650   Procedure: HOLMIUM LASER APPLICATION;  Surgeon: Rana Snare, MD;  Location: [facility];  Service: Urology;  Laterality: Left;  . LUMBAR DISKECOTMY AND FUSION  1972   L4 -- L5  . RADICAL RETROPUBIC PROSTATECTOMY  02-20-2003  . TONSILLECTOMY  as child  . TOTAL HIP ARTHROPLASTY Left 01/18/2013   Procedure: TOTAL HIP ARTHROPLASTY;  Surgeon: Garald Balding, MD;  Location: [place];  Service: Orthopedics;  Laterality: Left;  Left Total Hip Arthroplasty  . TRANSTHORACIC ECHOCARDIOGRAM  05-10-2013  dr Daneen Schick   mild LVH/  55-60%/  severe LAE/  mild to moderate MR and TR/  mild RAE/  moderate to severe RV elevated systolic pressure/  IVC demonstates <50% collapse w/ respiration/  due to atrial fib. unable evaluate diastolic function    Current Outpatient Prescriptions  Medication Sig Dispense Refill  . carvedilol (COREG) 3.125 MG tablet Take 1 tablet (3.125 mg total) by mouth 2 (two) times daily with a meal. 180 tablet 3  . colchicine 0.6 MG tablet Take 0.6 mg by mouth 2 (two) times daily as needed  (gout).     Marland Kitchen  Cyanocobalamin (B-12 COMPLIANCE INJECTION IJ) Inject as directed every 30 (thirty) days.     Marland Kitchen dutasteride (AVODART) 0.5 MG capsule Take 0.5 mg by mouth daily.    Marland Kitchen escitalopram (LEXAPRO) 10 MG tablet Take 10 mg by mouth every morning.    . folic acid (FOLVITE) 1 MG tablet Take 1 mg by mouth daily.    . furosemide (LASIX) 40 MG tablet Take 40 mg by mouth daily.    Marland Kitchen LIVALO 4 MG TABS Take 1 tablet by mouth daily.  0  . memantine (NAMENDA) 10 MG tablet take 1 tablet by mouth twice a day 60 tablet 1  . metformin (FORTAMET) 1000 MG (OSM) 24 hr tablet Take 1,000 mg by mouth 2 (two) times daily with a meal.     . Multiple Vitamins-Minerals (PRESERVISION AREDS PO) Take 1 tablet by mouth daily.    . ramipril (ALTACE) 2.5 MG capsule Take 2.5 mg by mouth daily.    Marland Kitchen warfarin (COUMADIN) 5 MG tablet Take 1-1.5 tablets (5-7.5 mg total) by mouth daily. Take 1 tablet daily except on Sundays take 1 1/2 tablet (7.5mg )     No current facility-administered medications for this visit.     Allergies as of 04/14/2017 - Review Complete 04/14/2017  Allergen Reaction Noted  . Percocet [oxycodone-acetaminophen] Other (See Comments) 01/13/2013  . Allopurinol Other (See Comments) 05/25/2014  . Dilaudid [hydromorphone hcl] Other (See Comments) 12/08/2013  . Other Other (See Comments) 01/12/2013    Vitals: BP (!) 141/77   Pulse (!) 57   Ht 6\' 3"  (1.905 m)   Wt 213 lb (96.6 kg)   BMI 26.62 kg/m  Last Weight:  Wt Readings from Last 1 Encounters:  04/14/17 213 lb (96.6 kg)   TIW:PYKD mass index is 26.62 kg/m.     Last Height:   Ht Readings from Last 1 Encounters:  04/14/17 6\' 3"  (1.905 m)    Physical exam:  General: The patient is awake, alert and appears not in acute distress. The patient is well groomed. Head: Normocephalic, atraumatic. Neck is supple. Mallampati 3  neck circumference:17,  Nasal airflow patent , TMJ click evident . Retrognathia is not seen.  Severe neck stiffness.    Cardiovascular:  Regular rate and rhythm, without  murmurs or carotid bruit, and without distended neck veins. Respiratory: Lungs are clear to auscultation. Skin:  Without evidence of edema, or rash Trunk:  The patient's posture is erect.   Neurologic exam :The patient is awake and alert, oriented to place and time.  Memory subjective  described as impaired;  He does best in the morning. He still recalls names.   MMSE - Mini Mental State Exam 04/14/2017 10/22/2016 09/16/2016  Orientation to time 2 3 0  Orientation to Place 4 4 4   Registration 3 3 3   Attention/ Calculation 0 2 1  Recall 2 1 1   Language- name 2 objects 2 2 2   Language- repeat 1 1 1   Language- follow 3 step command 3 3 2   Language- read & follow direction 1 1 1   Write a sentence 1 1 1   Copy design 0 1 1  Total score 19 22 17    CLINICAL DATA:  Declining memory function. Evaluate for Alzheimer's type dementia.  EXAM: NM PET METABOLIC BRAIN  TECHNIQUE: 10.9 mCi F-18 Florbetapir was injected intravenously. Full-ring PET imaging was performed from the vertex to the skull base. CT data was obtained and used for attenuation correction and anatomic localization.  COMPARISON:  Brain MRI 08/26/2016  Findings: There is increased florbetapir uptake seen in the cortical cerebral gray matter of the temporal, frontal, and parietal lobes, with these regions showing loss of the normal gray-white contrast. There is prominent demonstration of loss of gray-white differentiation in the paramedian parietal lobes. Additional prominent loss of gray-white differentiation in the temporal lobes and RIGHT frontal lobe. Areas of Preserved white matter and gray matter differentiation in the occipital lobes. The cerebellum has no evidence of abnormal uptake with normal gray-white differentiation.  CT (for attenuation): Moderate atrophy.  No acute findings  FINDINGS: IMPRESSION:  The scan is POSITIVE for brain amyloid and is  most consistent with the presence of moderate to frequent neuritic beta-amyloid plaques in the brain.  NOTE: Florbetapir is a radiopharmaceutical indicated for Positron Emission Tomography (PET) imaging of beta-amyloid neuritic plaques in the brains of adult patients with cognitive impairment being evaluated for suspected Alzheimer's disease (AD). A positive scan indicates moderate to frequent plaques, which demonstrates the presence of AD pathology. A negative scan indicates sparse or no plaques, which is inconsistent with a diagnosis of AD. Florbetapir in the is an adjunct to other diagnostic evaluations.   Electronically Signed   By: Suzy Bouchard M.D.   On: 10/07/2016 16:36      Attention span & concentration ability appears normal in casual conversation, he can even change to the [place] language .  Speech is fluent,  no  Dysarthria, but  hoarse-dysphonia and some aphasia. Some stuttering.  Mood and affect are appropriate.  Cranial nerves: taste and smell reported intact.  Pupils are equal and briskly reactive to light. Extraocular movements  intact . Visual fields by finger perimetry are intact. Hearing to finger rub intact.  Facial sensation intact to fine touch. Facial motor strength is symmetric and tongue and uvula move midline. Shoulder shrug was symmetrical.  Motor exam: Normal tone, muscle bulk and symmetric strength in all extremities. Sensory:  Fine touch, pinprick and vibration were tested in all extremities. Proprioception tested in the upper extremities was normal. Coordination: Rapid alternating movements in the fingers/hands was normal. Finger-to-nose maneuver  normal without evidence of ataxia, dysmetria or tremor. Gait and station: Patient walks without assistive device and is able unassisted to climb up to the exam table. Strength within normal limits. Stance is stable and normal. Tandem gait remains  fragmented.  His Gait is still unsteady and ataxic.  Turns either way with 4 Steps. Romberg testing is negative. Status post left hip replacement  2014 .  Deep tendon reflexes: in the  upper and lower extremities are symmetric - Babinski maneuver response is downgoing.  The patient was advised of the nature of the diagnosed dementia disorder , the treatment options and risks for general a health and wellness arising from not treating the condition.  I spent more than 25  minutes of face to face time with the patient and his wife. Greater than 50% of time was spent in counseling and coordination of care. We have discussed the diagnosis and differential and I answered the patient's questions.    Assessment:  After physical and neurologic examination, review of laboratory studies,  Personal review of imaging studies, reports of other /same  Imaging studies ,  Results of polysomnography/ neurophysiology testing and pre-existing records as far as provided in visit., my assessment is   1)  Moderate to severe memory decline. 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 Slowed EEG.  2)   status post brain radiation for cancer. 3)  Maintains MMSE between 19  and 22. MOCA not performed.    Plan:  Treatment plan and additional workup :   Bradycardia exacerbated - cannot take Aricept!!!! Namenda dose  10 mg bid. from today on.   Stay physically active, get enough sleep, plan cognitive challenging activities for the morning and physical activities for the afternoons.  Moderate well-balanced diet. [employer] 3 times weekly, stay physically active.  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  Rv in 6 month.   Asencion Partridge Redmond Whittley MD  04/14/2017   CC: Josetta Huddle, Md [address] [place], [place] [id number]

## 2017-04-15 ENCOUNTER — Other Ambulatory Visit: Payer: Self-pay | Admitting: Neurology

## 2017-04-23 DIAGNOSIS — Z7901 Long term (current) use of anticoagulants: Secondary | ICD-10-CM | POA: Diagnosis not present

## 2017-04-23 DIAGNOSIS — E538 Deficiency of other specified B group vitamins: Secondary | ICD-10-CM | POA: Diagnosis not present

## 2017-04-27 ENCOUNTER — Encounter: Payer: Self-pay | Admitting: Internal Medicine

## 2017-05-14 ENCOUNTER — Ambulatory Visit: Payer: Medicare Other | Admitting: Cardiothoracic Surgery

## 2017-05-14 ENCOUNTER — Ambulatory Visit
Admission: RE | Admit: 2017-05-14 | Discharge: 2017-05-14 | Disposition: A | Discharge status: Home / Self Care | Payer: Medicare Other | Source: Ambulatory Visit | Attending: Cardiothoracic Surgery | Admitting: Cardiothoracic Surgery

## 2017-05-14 DIAGNOSIS — I712 Thoracic aortic aneurysm, without rupture, unspecified: Secondary | ICD-10-CM

## 2017-05-14 MED ORDER — IOPAMIDOL (ISOVUE-370) INJECTION 76%
75.0000 mL | Freq: Once | INTRAVENOUS | Status: AC | PRN
  Administered 2017-05-14: 75 mL via INTRAVENOUS

## 2017-05-15 DIAGNOSIS — Z79899 Other long term (current) drug therapy: Secondary | ICD-10-CM | POA: Diagnosis not present

## 2017-05-15 DIAGNOSIS — I2510 Atherosclerotic heart disease of native coronary artery without angina pectoris: Secondary | ICD-10-CM | POA: Diagnosis not present

## 2017-05-15 DIAGNOSIS — I10 Essential (primary) hypertension: Secondary | ICD-10-CM | POA: Diagnosis not present

## 2017-05-15 DIAGNOSIS — Z0000 Encounter for general adult medical examination without abnormal findings: Secondary | ICD-10-CM | POA: Diagnosis not present

## 2017-05-15 DIAGNOSIS — E559 Vitamin D deficiency, unspecified: Secondary | ICD-10-CM | POA: Diagnosis not present

## 2017-05-15 DIAGNOSIS — Z8546 Personal history of malignant neoplasm of prostate: Secondary | ICD-10-CM | POA: Diagnosis not present

## 2017-05-15 DIAGNOSIS — K5900 Constipation, unspecified: Secondary | ICD-10-CM | POA: Diagnosis not present

## 2017-05-15 DIAGNOSIS — Z1389 Encounter for screening for other disorder: Secondary | ICD-10-CM | POA: Diagnosis not present

## 2017-05-15 DIAGNOSIS — Z7901 Long term (current) use of anticoagulants: Secondary | ICD-10-CM | POA: Diagnosis not present

## 2017-05-15 DIAGNOSIS — I4891 Unspecified atrial fibrillation: Secondary | ICD-10-CM | POA: Diagnosis not present

## 2017-05-15 DIAGNOSIS — I719 Aortic aneurysm of unspecified site, without rupture: Secondary | ICD-10-CM | POA: Diagnosis not present

## 2017-05-15 DIAGNOSIS — G309 Alzheimer's disease, unspecified: Secondary | ICD-10-CM | POA: Diagnosis not present

## 2017-05-15 DIAGNOSIS — C61 Malignant neoplasm of prostate: Secondary | ICD-10-CM | POA: Diagnosis not present

## 2017-05-15 DIAGNOSIS — D696 Thrombocytopenia, unspecified: Secondary | ICD-10-CM | POA: Diagnosis not present

## 2017-05-15 DIAGNOSIS — E538 Deficiency of other specified B group vitamins: Secondary | ICD-10-CM | POA: Diagnosis not present

## 2017-05-19 DIAGNOSIS — L570 Actinic keratosis: Secondary | ICD-10-CM | POA: Diagnosis not present

## 2017-05-19 DIAGNOSIS — Z85828 Personal history of other malignant neoplasm of skin: Secondary | ICD-10-CM | POA: Diagnosis not present

## 2017-05-19 DIAGNOSIS — D1801 Hemangioma of skin and subcutaneous tissue: Secondary | ICD-10-CM | POA: Diagnosis not present

## 2017-05-19 DIAGNOSIS — L821 Other seborrheic keratosis: Secondary | ICD-10-CM | POA: Diagnosis not present

## 2017-05-19 DIAGNOSIS — D225 Melanocytic nevi of trunk: Secondary | ICD-10-CM | POA: Diagnosis not present

## 2017-05-25 ENCOUNTER — Ambulatory Visit: Payer: Medicare Other | Admitting: Cardiothoracic Surgery

## 2017-05-28 ENCOUNTER — Ambulatory Visit (INDEPENDENT_AMBULATORY_CARE_PROVIDER_SITE_OTHER): Payer: Medicare Other | Admitting: Cardiothoracic Surgery

## 2017-05-28 ENCOUNTER — Encounter: Payer: Self-pay | Admitting: Cardiothoracic Surgery

## 2017-05-28 VITALS — BP 113/72 | HR 73 | Resp 16 | Ht 75.0 in | Wt 213.2 lb

## 2017-05-28 DIAGNOSIS — I712 Thoracic aortic aneurysm, without rupture, unspecified: Secondary | ICD-10-CM

## 2017-05-28 NOTE — Patient Instructions (Signed)

## 2017-05-28 NOTE — Progress Notes (Signed)
[address]       [place], [address]             [phone]                    Encarnacion A Washer [facility] Medical Record #[id number] Date of Birth: [birth year]  Referring: Belva Crome, MD Primary Care: Josetta Huddle, MD  Chief Complaint:    Chief Complaint  Patient presents with  . TAA    6 month f/u with CTA on 05/14/17    History of Present Illness:    Jacob [name] 71 y.o. male is seen in the office  today for history ofmildly dilated ascending aorta. He returns today with a follow-up CT scan of the chest.    The patient has known coronary occlusive disease having had coronary artery bypass grafting done by me, 2 vessels with the mammary to the LAD  17 years ago. The patient's had no further coronary artery problems, he has not had a repeat  Cardiac catheterization. He does have chronic atrial fibrillation and has been on Coumadin. 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 PET scan in 2013 and subsequent CT scans in 2014, 2015, and 2715 show mildly dilated ascending aorta 4.3 cm at the mid ascending aorta and 4.5 cm at the sinus of Valsalva. Echo shows a 3 leaflet aortic valve. Patient has no definite family history of aortic dissection. His father did die suddenly in his 78s while working under his house this was presumed to be a myocardial infarction but not confirmed.      Current Activity/ Functional Status:  Patient is independent with mobility/ambulation, transfers, ADL's, IADL's.   Zubrod Score: At the time of surgery this patient's most appropriate activity status/level should be described as: []     0    Normal activity, no symptoms [x]     1    Restricted in physical strenuous activity but ambulatory, able to do out light work []     2    Ambulatory and capable of self care, unable to do work activities, up and about               >50 % of waking hours                               []     3    Only limited self care, in bed greater than 50% of waking hours []     4    Completely disabled, no self care, confined to bed or chair []     5    Moribund   Past Medical History:  Diagnosis Date  . Adrenal adenoma    bilateral  . Ascending aortic aneurysm ([address])    monitored by dr dr Daneen Schick-  stable 4.4cm x 4.3cm per CT 06-01-2014  . At risk for sleep apnea    STOP-BANG= 6     SENT TO PCP 03-05-2015  . Borderline diabetes mellitus   . Chronic anticoagulation   . Chronic atrial fibrillation ([place])   . Chronic diastolic heart failure ([place])   . Coronary artery disease    cardiologist-  dr Daneen Schick--  s/p  CABG x2 2000/   last myoview 2012-- normal  . Diabetes mellitus without complication ([place])   . Factor 5 Leiden mutation, heterozygous ([place])   .  Gross hematuria   . History of adenomatous polyp of colon    2009  . History of basal cell carcinoma excision    forehead  . History of DVT of lower extremity    x2 1980's  . History of gout   . History of prostate cancer oncologist --dr Rafael Bihari [facility]--  no recurrence since 2013   dx 2004--  s/p  radical retropubic prostatectomy/  recurrence 2006  and 2013 s/p radiation at [facility]  and completed Hormone tx  . Hx of radiation therapy    external beam proton radiation 2006  . Hypertension   . Left ureteral stone   . Multiple thyroid nodules    Benign bx done July 2014  . OA (osteoarthritis)   . Recovering alcoholic in remission ([place])   . S/P CABG x 2    09-17-1999  . Secondary pulmonary hypertension     Past Surgical History:  Procedure Laterality Date  . CARDIAC CATHETERIZATION  09-03-1999  dr Daneen Schick   Abnormal myoview/  80% mLAD,  total occlusion pRCA , collaterals from pRCA segment and left CFX and LAD,  OM1 sig. lesion,  total occlusion very small OM2,  persived LVSF,  elevated end-diastolic pressure  . CARDIOVASCULAR STRESS TEST  03-04-2011   dr Daneen Schick   normal perfusion study/   attenuation artifact in inferior region of myocardium,  no ischemia or infarct/scar,  unable to gate wall motion due to irregular rhythm  . COLONOSCOPY WITH PROPOFOL [name] 12/27/2013   Procedure: COLONOSCOPY WITH PROPOFOL;  Surgeon: Garlan Fair, MD;  Location: [facility];  Service: Endoscopy;  Laterality: [name];  . CORONARY ARTERY BYPASS GRAFT  09-17-1999   dr Jashan Cotten   x2 vessel--  LIMA to LAD and RCA  . CYSTOSCOPY WITH RETROGRADE PYELOGRAM, URETEROSCOPY AND STENT PLACEMENT Left 03/07/2015   Procedure: CYSTOSCOPY WITH RETROGRADE PYELOGRAM, URETEROSCOPY  WITH STONE EXTRACTION AND STENT PLACEMENT;  Surgeon: Rana Snare, MD;  Location: [facility];  Service: Urology;  Laterality: Left;  . EXCISIONAL BX OF MASS, LEFT DISTAL BRACHIUM  08-09-2002  . HEMORRHOID SURGERY  2000  . HOLMIUM LASER APPLICATION Left 8/93/8101   Procedure: HOLMIUM LASER APPLICATION;  Surgeon: Rana Snare, MD;  Location: [facility];  Service: Urology;  Laterality: Left;  . LUMBAR DISKECOTMY AND FUSION  1972   L4 -- L5  . RADICAL RETROPUBIC PROSTATECTOMY  02-20-2003  . TONSILLECTOMY  as child  . TOTAL HIP ARTHROPLASTY Left 01/18/2013   Procedure: TOTAL HIP ARTHROPLASTY;  Surgeon: Garald Balding, MD;  Location: [facility];  Service: Orthopedics;  Laterality: Left;  Left Total Hip Arthroplasty  . TRANSTHORACIC ECHOCARDIOGRAM  05-10-2013  dr Daneen Schick   mild LVH/  55-60%/  severe LAE/  mild to moderate MR and TR/  mild RAE/  moderate to severe RV elevated systolic pressure/  IVC demonstates <50% collapse w/ respiration/  due to atrial fib. unable evaluate diastolic function    Family History  Problem Relation Age of Onset  . Cancer Mother   . Heart attack Father     Social History   Social History  . Marital status: Married    Spouse name: [name]  . Number of children: [name]  . Years of education: [name]   Occupational History  . Not on file.   Social History Main Topics  . Smoking status:  Former Smoker    Packs/day: 1.00    Years: 15.00    Quit date: 11/24/1985  .  Smokeless tobacco: Never Used  . Alcohol use No     Comment: Recovery alcoholic --  attends AA  . Drug use: No  . Sexual activity: Not on file   Other Topics Concern  . Not on file   Social History Narrative  . No narrative on file    History  Smoking Status  . Former Smoker  . Packs/day: 1.00  . Years: 15.00  . Quit date: 11/24/1985  Smokeless Tobacco  . Never Used    History  Alcohol Use No    Comment: Recovery alcoholic --  attends AA     Allergies  Allergen Reactions  . Percocet [Oxycodone-Acetaminophen] Other (See Comments)    Paranoid  . Allopurinol Other (See Comments)    Thrombocytopenia   . Dilaudid [Hydromorphone Hcl] Other (See Comments)    paranoid  . Other Other (See Comments)    Pt couldn't remember the name of med he was allergic to, he was to find out and let us know    Current Outpatient Prescriptions  Medication Sig Dispense Refill  . carvedilol (COREG) 3.125 MG tablet Take 1 tablet (3.125 mg total) by mouth 2 (two) times daily with a meal. 180 tablet 3  . colchicine 0.6 MG tablet Take 0.6 mg by mouth 2 (two) times daily as needed (gout).     . Cyanocobalamin (B-12 COMPLIANCE INJECTION IJ) Inject as directed every 30 (thirty) days.     Marland Kitchen dutasteride (AVODART) 0.5 MG capsule Take 0.5 mg by mouth daily.    Marland Kitchen escitalopram (LEXAPRO) 10 MG tablet Take 10 mg by mouth every morning.    . folic acid (FOLVITE) 1 MG tablet Take 1 mg by mouth daily.    . furosemide (LASIX) 40 MG tablet Take 40 mg by mouth daily.    . memantine (NAMENDA) 10 MG tablet take 1 tablet by mouth twice a day 60 tablet 1  . metformin (FORTAMET) 1000 MG (OSM) 24 hr tablet Take 1,000 mg by mouth 2 (two) times daily with a meal.     . Multiple Vitamins-Minerals (PRESERVISION AREDS PO) Take 1 tablet by mouth daily.    . ramipril (ALTACE) 2.5 MG capsule Take 2.5 mg by mouth daily.    Marland Kitchen warfarin (COUMADIN) 5 MG  tablet Take 1-1.5 tablets (5-7.5 mg total) by mouth daily. Take 1 tablet daily except on Sundays take 1 1/2 tablet (7.5mg )     No current facility-administered medications for this visit.       Review of Systems:     Cardiac Review of Systems: Y or N  Chest Pain [  n  ]  Resting SOB [ n  ] Exertional SOB  [ n ]  Orthopnea [n  ]   Pedal Edema [ n  ]    Palpitations [  n] Syncope  [n  ]   Presyncope [ y  ]  General Review of Systems: [Y] = yes [  ]=no Constitional: recent weight change Blue.Reese  ];  Wt loss over the last 3 months [   ] anorexia [  ]; fatigue [  ]; nausea [  ]; night sweats [  ]; fever [  ]; or chills [  ];          Dental: poor dentition[  ]; Last Dentist visit:   Eye : blurred vision [  ]; diplopia [   ]; vision changes [  ];  Amaurosis fugax[  ]; Resp: cough [  ];  wheezing[  ];  hemoptysis[  ]; shortness of breath[  ]; paroxysmal nocturnal dyspnea[  ]; dyspnea on exertion[  ]; or orthopnea[  ];  GI:  gallstones[  ], vomiting[  ];  dysphagia[  ]; melena[  ];  hematochezia [  ]; heartburn[  ];   Hx of  Colonoscopy[  ]; GU: kidney stones [  ]; hematuria[  ];   dysuria [  ];  nocturia[  ];  history of     obstruction [  ]; urinary frequency [  ]             Skin: rash, swelling[  ];, hair loss[  ];  peripheral edema[  ];  or itching[  ]; Musculosketetal: myalgias[  ];  joint swelling[  ];  joint erythema[  ];  joint pain[  ];  back pain[  ];  Heme/Lymph: bruising[  ];  bleeding[  ];  anemia[  ];  Neuro: TIA[  ];  headaches[  ];  stroke[  ];  vertigo[  ];  seizures[  ];   paresthesias[  ];  difficulty walking[  ];  Psych:depression[  ]; anxiety[  ];  Endocrine: diabetes[  ];  thyroid dysfunction[ y ];  Immunizations: Flu up to date [  ]; Pneumococcal up to date [  ];  Other:  Physical Exam: BP 113/72 (BP Location: Right Arm, Patient Position: Sitting, Cuff Size: Large)   Pulse 73   Resp 16   Ht 6\' 3"  (1.905 m)   Wt 213 lb 3.2 oz (96.7 kg)   SpO2 98% Comment: ON RA  BMI 26.65  kg/m   PHYSICAL EXAMINATION: General appearance: alert, cooperative, appears stated age, no distress and mild trouble with words and asked his wife for details requireing memory  Head: Normocephalic, without obvious abnormality, atraumatic Neck: no adenopathy, no carotid bruit, no JVD, supple, symmetrical, trachea midline and thyroid not enlarged, symmetric, no tenderness/mass/nodules Lymph nodes: Cervical, supraclavicular, and axillary nodes normal. Resp: clear to auscultation bilaterally Back: negative Cardio: irregularly irregular rhythm and no murmur GI: soft, non-tender; bowel sounds normal; no masses,  no organomegaly Extremities: extremities normal, atraumatic, no cyanosis or edema and Homans sign is negative, no sign of DVT Neurologic: Grossly normal  Diagnostic Studies & Laboratory data:     Recent Radiology Findings:   Ct Angio Chest Aorta W &/or Wo Contrast  Result Date: 05/14/2017 CLINICAL DATA:  Thoracic aortic aneurysm EXAM: CT ANGIOGRAPHY CHEST WITH CONTRAST TECHNIQUE: Multidetector CT imaging of the chest was performed using the standard protocol during bolus administration of intravenous contrast. Multiplanar CT image reconstructions and MIPs were obtained to evaluate the vascular anatomy. CONTRAST:  75 cc Isovue 370 COMPARISON:  07/25/2016 FINDINGS: Cardiovascular:  Maximal aortic diameters are as follows: Sinus of all solid vial:  4.4 cm. Sino-tubular junction:  3.5 cm. Ascending aorta:  4.5 cm Aortic arch:  3.0 cm. Descending thoracic aorta:  3.3 cm. Previously, maximal diameter of the ascending aorta was 4.5 cm. The study is stable. There is no evidence of aortic dissection. There is no obvious intramural hematoma. Mild atherosclerotic calcifications of the aortic arch are present. Great vessels are patent within the confines of the examination. Atherosclerotic calcifications are present at the origin of the left subclavian artery as well as the origin of the vertebral  arteries. Low-density in the left atrial appendage is unchanged. Status post coronary artery bypass grafting. Mild aortic valvular calcification. Mediastinum/Nodes: Stable right thyroid mass. Left thyroid calcifications are stable. No evidence of abnormal mediastinal adenopathy. Lungs/Pleura:  No pneumothorax. No pleural effusion. Clear lungs. Left hemidiaphragm is elevated. Upper Abdomen: Left lobe of the liver is atrophic. This is stable. Prominence of the adrenal glands bilaterally is also stable in a hyperplasia pattern. Musculoskeletal: Stable T3, T7, and L1 compression fractures. Review of the MIP images confirms the above findings. IMPRESSION: Stable aneurysmal dilatation of the ascending aorta at 4.5 cm. Stable low-density in the left atrial appendage. Chronic thrombus is not excluded. Aortic Atherosclerosis (ICD10-I70.0). Electronically Signed   By: Marybelle Killings M.D.   On: 05/14/2017 15:13    I have independently reviewed the above radiology studies  and reviewed the findings with the patient.   Recent Lab Findings: Lab Results  Component Value Date   WBC 4.5 02/02/2017   HGB 13.0 02/02/2017   HCT 39.8 02/02/2017   PLT 88 (L) 02/02/2017   GLUCOSE 117 (H) 02/02/2017   ALT 15 (L) 02/02/2017   AST 27 02/02/2017   NA 140 02/02/2017   K 4.3 02/02/2017   CL 107 02/02/2017   CREATININE 1.18 02/02/2017   BUN 22 (H) 02/02/2017   CO2 26 02/02/2017   INR 1.94 02/02/2017   HGBA1C 5.8 (H) 01/18/2013   CT chest : CLINICAL DATA:  F/u to aortic aneurysm  EXAM: CT ANGIOGRAPHY CHEST WITH CONTRAST  TECHNIQUE: Multidetector CT imaging of the chest was performed using the standard protocol during bolus administration of intravenous contrast. Multiplanar CT image reconstructions and MIPs were obtained to evaluate the vascular anatomy.  CONTRAST:  100 mL Isovue 370 IV  COMPARISON:  CT abdomen 02/26/2015, chest 06/01/2014  FINDINGS: Vascular: Right arm IV contrast administration. The  SVC is patent. Right atrial enlargement. Mild right ventral to the ventricular dilatation. Satisfactory opacification of pulmonary arteries noted, and there is no evidence of pulmonary emboli. Patent bilateral pulmonary veins drain into the left atrium. Left atrial enlargement. Incomplete opacification distally in the left atrial appendage suggesting thrombus; findings reviewed with Dr. Weber Cooks, who concurs. Moderate coronary calcifications. Previous CABG. Thoracic aortic segmental diameters as follows:  4.4 cm sinuses of Valsalva  3.6 cm sino-tubular junction  4.5 cm proximal ascending (previously 4.4 cm)  3.4 cm distal ascending/proximal arch  2.7 cm distal arch  3.3 cm proximal descending  2.5 cm distal descending  Scattered atheromatous calcifications in the distal arch and descending thoracic segment. No intra luminal thrombus. No dissection or stenosis. Classic 3 vessel brachiocephalic arterial origin anatomy without proximal stenosis. Tortuous distal descending segment.  Mediastinum/Lymph Nodes: 3.6 cm mass from the inferior margin right lobe of the thyroid. No hilar or mediastinal adenopathy. No pericardial effusion.  Lungs/Pleura: Patchy infiltrate or subsegmental atelectasis in posterior basal segment left lower lobe. Minimal dependent atelectasis posteriorly in both lower lobes. No pleural effusion. No pneumothorax.  Upper abdomen: No acute findings.  Musculoskeletal: Stable T7 and L1 compression deformities. Previous median sternotomy.  Review of the MIP images confirms the above findings.  IMPRESSION: 1. 4.5 cm ascending aortic aneurysm without complicating features. Recommend semi-annual imaging followup by CTA or MRA and referral to cardiothoracic surgery if not already obtained. This recommendation follows 2010 ACCF/AHA/AATS/ACR/ASA/SCA/SCAI/SIR/STS/SVM Guidelines for the Diagnosis and Management of Patients With Thoracic  Aortic Disease. Circulation. 2010; 121: A193-X902 2. Thrombus versus incomplete opacification in the left atrial appendage. Correlate with TEE. 3. Patchy infiltrate or subsegmental atelectasis, posterior left lower lobe. 4. Right thyroid mass as previously described.   Electronically Signed   By: Lucrezia Europe M.D.   On: 07/25/2016 14:08  ECHO: 2016 History:  PMH:  Ascending aortic aneurysm. Acquired from the patient and from the patient&'s chart.  Atrial fibrillation. Coronary artery disease.  Congestive heart failure.  Risk factors: Hypertension.  ------------------------------------------------------------------- Study Conclusions  - Left ventricle: The cavity size was normal. There was mild   concentric hypertrophy. Systolic function was normal. The   estimated ejection fraction was in the range of 60% to 65%. Wall   motion was normal; there were no regional wall motion   abnormalities. There was no evidence of elevated ventricular   filling pressure by Doppler parameters. - Aorta: Ascending aortic diameter: 41 mm (S). - Ascending aorta: The ascending aorta was mildly dilated. - Mitral valve: Calcified annulus. - Left atrium: The atrium was moderately dilated. - Right atrium: The atrium was mildly dilated. - Pulmonary arteries: Systolic pressure was mildly increased. PA   peak pressure: 33 mm Hg (S).  Echocardiography.  M-mode, complete 2D, spectral Doppler, and color Doppler.  Birthdate:  Patient birthdate: 01/18/1946.  Age:  Patient is 71 yr old.  Sex:  Gender: male.    BMI: 28.6 kg/m^2.  Blood pressure:     127/62  Patient status:  Outpatient.  Study date: Study date: 05/29/2015. Study time: 01:52 PM.  Location:  [facility]  -------------------------------------------------------------------  ------------------------------------------------------------------- Left ventricle:  The cavity size was normal. There was mild concentric hypertrophy. Systolic  function was normal. The estimated ejection fraction was in the range of 60% to 65%. Wall motion was normal; there were no regional wall motion abnormalities. There was no evidence of elevated ventricular filling pressure by Doppler parameters.  ------------------------------------------------------------------- Aortic valve:   Trileaflet; normal thickness leaflets. Mobility was not restricted.  Doppler:  Transvalvular velocity was within the normal range. There was no stenosis. There was no regurgitation.   ------------------------------------------------------------------- Aorta:  Ascending aorta: The ascending aorta was mildly dilated.  ------------------------------------------------------------------- Mitral valve:   Calcified annulus. Mobility was not restricted. Doppler:  Transvalvular velocity was within the normal range. There was no evidence for stenosis. There was no regurgitation.    Peak gradient (D): 3 mm Hg.  ------------------------------------------------------------------- Left atrium:  The atrium was moderately dilated.  ------------------------------------------------------------------- Right ventricle:  The cavity size was normal. Wall thickness was normal. Systolic function was normal.  ------------------------------------------------------------------- Pulmonic valve:   Poorly visualized.  Structurally normal valve. Cusp separation was normal.  Doppler:  Transvalvular velocity was within the normal range. There was no evidence for stenosis. There was trivial regurgitation.  ------------------------------------------------------------------- Tricuspid valve:   Structurally normal valve.    Doppler: Transvalvular velocity was within the normal range. There was mild regurgitation.  ------------------------------------------------------------------- Pulmonary artery:   The main pulmonary artery was normal-sized. Systolic pressure was mildly  increased.  ------------------------------------------------------------------- Right atrium:  The atrium was mildly dilated.  ------------------------------------------------------------------- Pericardium:  There was no pericardial effusion.  ------------------------------------------------------------------- Systemic veins: Inferior vena cava: The vessel was dilated. The respirophasic diameter changes were in the normal range (= 50%), consistent with normal central venous pressure.  ------------------------------------------------------------------- Measurements   Left ventricle                           Value        Reference  LV ID, ED, PLAX chordal                  45.5  mm     43 - 52  LV ID, ES, PLAX chordal  28.8  mm     23 - 38  LV fx shortening, PLAX chordal           37    %      >=29  LV PW thickness, ED                      12.1  mm     ---------  IVS/LV PW ratio, ED                      1.04         <=1.3  Stroke volume, 2D                        60    ml     ---------  Stroke volume/bsa, 2D                    25    ml/m^2 ---------  LV e&', lateral                           15    cm/s   ---------  LV E/e&', lateral                         5.3          ---------  LV e&', medial                            7.51  cm/s   ---------  LV E/e&', medial                          10.59        ---------  LV e&', average                           11.26 cm/s   ---------  LV E/e&', average                         7.06         ---------    Ventricular septum                       Value        Reference  IVS thickness, ED                        12.6  mm     ---------    LVOT                                     Value        Reference  LVOT ID, S                               20    mm     ---------  LVOT area                                3.14  cm^2   ---------  LVOT ID                                  20    mm     ---------  LVOT peak velocity, S                     101   cm/s   ---------  LVOT mean velocity, S                    66.2  cm/s   ---------  LVOT VTI, S                              19.1  cm     ---------  LVOT peak gradient, S                    4     mm Hg  ---------  Stroke volume (SV), LVOT DP              60    ml     ---------  Stroke index (SV/bsa), LVOT DP           25.4  ml/m^2 ---------    Aortic valve                             Value        Reference  Aortic regurg pressure half-time         459   ms     ---------    Aorta                                    Value        Reference  Aortic root ID, ED                       38    mm     ---------  Ascending aorta ID, A-P, S               41    mm     ---------    Left atrium                              Value        Reference  LA ID, A-P, ES                           56    mm     ---------  LA ID/bsa, A-P                   (H)     2.37  cm/m^2 <=2.2  LA volume, S                             106   ml     ---------  LA volume/bsa, S                         44.9  ml/m^2 ---------  LA volume,  ES, 1-p A4C                   119   ml     ---------  LA volume/bsa, ES, 1-p A4C               50.4  ml/m^2 ---------  LA volume, ES, 1-p A2C                   86    ml     ---------  LA volume/bsa, ES, 1-p A2C               36.4  ml/m^2 ---------    Mitral valve                             Value        Reference  Mitral E-wave peak velocity              79.5  cm/s   ---------  Mitral A-wave peak velocity              51.3  cm/s   ---------  Mitral deceleration time         (H)     236   ms     150 - 230  Mitral peak gradient, D                  3     mm Hg  ---------  Mitral E/A ratio, peak                   1.5          ---------    Pulmonary arteries                       Value        Reference  PA pressure, S, DP               (H)     33    mm Hg  <=30    Tricuspid valve                          Value        Reference  Tricuspid regurg peak velocity           250   cm/s    ---------  Tricuspid peak RV-RA gradient            25    mm Hg  ---------    Systemic veins                           Value        Reference  Estimated CVP                            8     mm Hg  ---------    Right ventricle                          Value        Reference  RV pressure, S, DP               (H)     33    mm Hg  <=30  RV s&', lateral, S                        12.9  cm/s   ---------  Legend: (L)  and  (H)  mark values outside specified reference range.  ------------------------------------------------------------------- Prepared and Electronically Authenticated by  Candee Furbish, M.D. 2016-07-05T15:03:40  Aortic Size Index=     4.5    /Body surface area is 2.26 meters squared. =1.99  < 2.75 cm/m2      4% risk per year 2.75 to 4.25          8% risk per year > 4.25 cm/m2    20% risk per year   Assessment / Plan:    1/ 4.5 cm ascending aortic aneurysm without complicating features., Unchanged from CT 8 months ago. Again discussed with the patient the diagnosis of dilated ascending aorta at the current size, redo cardiac surgery status, I would not recommend any surgical intervention at this time. We'll plan follow-up CT of chest , CTA in one year 2/ Aortic valve:   Trileaflet; normal thickness leaflets. There was no stenosis. There was no Regurgitation By echo 2016 or on physical exam 3/ Atrial fibrillation on chronic Coumadin therapy   4/ mild cognitive/memory problems. 5/ Prostate cancer with bony metastasis    Patient was warned about not using Cipro and similar antibiotics. Recent studies have raised concern that fluoroquinolone antibiotics could be associated with an increased risk of aortic aneurysm Fluoroquinolones have non-antimicrobial properties that might jeopardise the integrity of the extracellular matrix of the vascular wall In a  propensity score matched cohort study in [place], there was a 66% increased rate of aortic aneurysm or dissection associated  with oral fluoroquinolone use, compared with amoxicillin use, within a 60 day risk period from start of treatment   Grace Isaac MD      [address] [place] [address] Office [phone]   Beeper [phone]  05/28/2017 10:43 AM

## 2017-06-02 ENCOUNTER — Other Ambulatory Visit: Payer: Self-pay | Admitting: Neurology

## 2017-06-11 ENCOUNTER — Encounter: Payer: Self-pay | Admitting: *Deleted

## 2017-06-12 ENCOUNTER — Encounter: Payer: Self-pay | Admitting: *Deleted

## 2017-06-12 DIAGNOSIS — Z7901 Long term (current) use of anticoagulants: Secondary | ICD-10-CM | POA: Diagnosis not present

## 2017-06-19 ENCOUNTER — Ambulatory Visit (INDEPENDENT_AMBULATORY_CARE_PROVIDER_SITE_OTHER): Payer: Medicare Other | Admitting: Internal Medicine

## 2017-06-19 ENCOUNTER — Encounter (INDEPENDENT_AMBULATORY_CARE_PROVIDER_SITE_OTHER): Payer: Self-pay

## 2017-06-19 ENCOUNTER — Encounter: Payer: Self-pay | Admitting: Internal Medicine

## 2017-06-19 VITALS — BP 122/78 | HR 62 | Ht 74.0 in | Wt 208.2 lb

## 2017-06-19 DIAGNOSIS — K5904 Chronic idiopathic constipation: Secondary | ICD-10-CM

## 2017-06-19 MED ORDER — POLYETHYLENE GLYCOL 3350 17 GM/SCOOP PO POWD: ORAL | 5 refills | Status: DC

## 2017-06-19 NOTE — Progress Notes (Signed)
Patient ID: Jacob [name], male   DOB: 07-09-1946, 71 y.o.   MRN: [id number] HPI: Jacob [name] is a 71 year old male with a past medical history of chronic idiopathic constipation, factor V Leiden deficiency on warfarin, history of atrial fibrillation, hypertension, CAD, history of prostate cancer status post prostatectomy, diabetes and Alzheimer's who is seen in consultation at the request of Dr. Earle [name] to establish care for his GI issues. He is here today with his wife.  He has a history of chronic idiopathic constipation which he states seems to have worsened in the past year. He reports he is only having a bowel movement with using glycerin suppositories. He estimates he is using glycerin suppositories intermittently but 3-4 days per week. He's been using MiraLAX 17 g but not on a daily consistent basis. He previously tried senna without much benefit. Per one of Dr. Durenda [name] notes he had samples of Linzess but neither the patient nor his wife feel that he ever tried this medication. He denies abdominal pain. He does feel lower abdominal pressure when he is constipated. He denies blood in his stool and melena. Reports good appetite without nausea or vomiting. Denies dysphagia and odynophagia.  He had a colonoscopy in February 2015 which revealed mild radiation proctitis but was otherwise normal. Due to his prior history of polyps repeat colonoscopy was recommended in February 2020  He denies a family history of colon cancer.  Past Medical History:  Diagnosis Date  . Adrenal adenoma    bilateral  . Ascending aortic aneurysm ([place])    monitored by dr dr Daneen Schick-  stable 4.4cm x 4.3cm per CT 06-01-2014  . At risk for sleep apnea    STOP-BANG= 6     SENT TO PCP 03-05-2015  . Borderline diabetes mellitus   . Chronic anticoagulation   . Chronic atrial fibrillation ([place])   . Chronic diastolic heart failure ([place])   . Chronic idiopathic constipation   . Coronary artery disease    cardiologist-  dr Daneen Schick--  s/p  CABG x2 2000/   last myoview 2012-- normal  . Diabetes mellitus without complication ([place])   . Factor 5 Leiden mutation, heterozygous ([place])   . Gross hematuria   . History of adenomatous polyp of colon    2009  . History of basal cell carcinoma excision    forehead  . History of DVT of lower extremity    x2 1980's  . History of gout   . History of prostate cancer oncologist --dr Rafael Bihari [facility]--  no recurrence since 2013   dx 2004--  s/p  radical retropubic prostatectomy/  recurrence 2006  and 2013 s/p radiation at [facility]  and completed Hormone tx  . Hx of radiation therapy    external beam proton radiation 2006  . Hypertension   . Left ureteral stone   . Multiple thyroid nodules    Benign bx done July 2014  . OA (osteoarthritis)   . Radiation proctitis   . Recovering alcoholic in remission ([place])   . S/P CABG x 2    09-17-1999  . Secondary pulmonary hypertension     Past Surgical History:  Procedure Laterality Date  . CARDIAC CATHETERIZATION  09-03-1999  dr Daneen Schick   Abnormal myoview/  80% mLAD,  total occlusion pRCA , collaterals from pRCA segment and left CFX and LAD,  OM1 sig. lesion,  total occlusion very small OM2,  persived LVSF,  elevated end-diastolic pressure  . CARDIOVASCULAR STRESS TEST  03-04-2011   dr Daneen Schick   normal perfusion study/  attenuation artifact in inferior region of myocardium,  no ischemia or infarct/scar,  unable to gate wall motion due to irregular rhythm  . COLONOSCOPY WITH PROPOFOL [name] 12/27/2013   Procedure: COLONOSCOPY WITH PROPOFOL;  Surgeon: Garlan Fair, MD;  Location: [facility];  Service: Endoscopy;  Laterality: [name];  . CORONARY ARTERY BYPASS GRAFT  09-17-1999   dr gerhardt   x2 vessel--  LIMA to LAD and RCA  . CYSTOSCOPY WITH RETROGRADE PYELOGRAM, URETEROSCOPY AND STENT PLACEMENT Left 03/07/2015   Procedure: CYSTOSCOPY WITH RETROGRADE PYELOGRAM, URETEROSCOPY  WITH STONE  EXTRACTION AND STENT PLACEMENT;  Surgeon: Rana Snare, MD;  Location[facility];  Service: Urology;  Laterality: Left;  . EXCISIONAL BX OF MASS, LEFT DISTAL BRACHIUM  08-09-2002  . HEMORRHOID SURGERY  2000  . HOLMIUM LASER APPLICATION Left 3/53/2992   Procedure: HOLMIUM LASER APPLICATION;  Surgeon: Rana Snare, MD;  Location: [facility];  Service: Urology;  Laterality: Left;  . KNEE SURGERY    . LUMBAR DISKECOTMY AND FUSION  1972   L4 -- L5  . RADICAL RETROPUBIC PROSTATECTOMY  02-20-2003  . TONSILLECTOMY  as child  . TOTAL HIP ARTHROPLASTY Left 01/18/2013   Procedure: TOTAL HIP ARTHROPLASTY;  Surgeon: Garald Balding, MD;  Location: [facility];  Service: Orthopedics;  Laterality: Left;  Left Total Hip Arthroplasty  . TRANSTHORACIC ECHOCARDIOGRAM  05-10-2013  dr Daneen Schick   mild LVH/  55-60%/  severe LAE/  mild to moderate MR and TR/  mild RAE/  moderate to severe RV elevated systolic pressure/  IVC demonstates <50% collapse w/ respiration/  due to atrial fib. unable evaluate diastolic function    Outpatient Medications Prior to Visit  Medication Sig Dispense Refill  . carvedilol (COREG) 3.125 MG tablet Take 1 tablet (3.125 mg total) by mouth 2 (two) times daily with a meal. 180 tablet 3  . colchicine 0.6 MG tablet Take 0.6 mg by mouth 2 (two) times daily as needed (gout).     . Cyanocobalamin (B-12 COMPLIANCE INJECTION IJ) Inject as directed every 30 (thirty) days.     Marland Kitchen dutasteride (AVODART) 0.5 MG capsule Take 0.5 mg by mouth daily.    Marland Kitchen escitalopram (LEXAPRO) 10 MG tablet Take 10 mg by mouth every morning.    . folic acid (FOLVITE) 1 MG tablet Take 1 mg by mouth daily.    . furosemide (LASIX) 40 MG tablet Take 40 mg by mouth daily.    . memantine (NAMENDA) 10 MG tablet take 1 tablet by mouth twice a day 120 tablet 0  . metformin (FORTAMET) 1000 MG (OSM) 24 hr tablet Take 1,000 mg by mouth 2 (two) times daily with a meal.     . ramipril (ALTACE) 2.5 MG  capsule Take 2.5 mg by mouth daily.    Marland Kitchen warfarin (COUMADIN) 5 MG tablet Take 1-1.5 tablets (5-7.5 mg total) by mouth daily. Take 1 tablet daily except on Sundays take 1 1/2 tablet (7.5mg )    . Multiple Vitamins-Minerals (PRESERVISION AREDS PO) Take 1 tablet by mouth daily.     No facility-administered medications prior to visit.     Allergies  Allergen Reactions  . Percocet [Oxycodone-Acetaminophen] Other (See Comments)    Paranoid  . Allopurinol Other (See Comments)    Thrombocytopenia   . Crestor [Rosuvastatin Calcium]     Possible asthenia with severe weight loss 2014  . Dilaudid [Hydromorphone Hcl] Other (See Comments)  paranoid    Family History  Problem Relation [name] of Onset  . Cancer Mother   . CAD Mother   . Heart attack Mother   . Heart attack Father   . Valvular heart disease Father   . Thyroid disease Father   . Heart failure Father     Social History  Substance Use Topics  . Smoking status: Former Smoker    Packs/day: 1.00    Years: 15.00    Quit date: 11/24/1985  . Smokeless tobacco: Never Used  . Alcohol use No     Comment: Recovery alcoholic --  attends AA    ROS: As per history of present illness, otherwise negative  BP 122/78 (BP Location: Left Arm, Patient Position: Sitting, Cuff Size: Large)   Pulse 62   Ht 6\' 2"  (1.88 m)   Wt 208 lb 3.2 oz (94.4 kg)   SpO2 97%   BMI 26.73 kg/m  Constitutional: Well-developed and well-nourished. No distress. HEENT: Normocephalic and atraumatic.  Conjunctivae are normal.  No scleral icterus. Neck: Neck supple. Trachea midline. Cardiovascular: Normal rate, regular rhythm and intact distal pulses. Pulmonary/chest: Effort normal and breath sounds normal. No wheezing, rales or rhonchi. Abdominal: Soft, nontender, nondistended. Bowel sounds active throughout. There are no masses palpable. No hepatosplenomegaly. Extremities: no clubbing, cyanosis, or edema Neurological: Alert and oriented to person place and  time. Skin: Skin is warm and dry.  Psychiatric: Normal mood and affect. Behavior is normal.  RELEVANT LABS AND IMAGING: CBC    Component Value Date/Time   WBC 4.5 02/02/2017 1508   RBC 4.61 02/02/2017 1508   HGB 13.0 02/02/2017 1508   HGB 14.1 04/26/2008 1100   HCT 39.8 02/02/2017 1508   HCT 41.1 04/26/2008 1100   PLT 88 (L) 02/02/2017 1508   PLT 106 C/W Citrate, Rare Giant Plt (L) 04/26/2008 1100   MCV 86.3 02/02/2017 1508   MCV 82.8 04/26/2008 1100   MCH 28.2 02/02/2017 1508   MCHC 32.7 02/02/2017 1508   RDW 15.9 (H) 02/02/2017 1508   RDW 13.8 04/26/2008 1100   LYMPHSABS 1.1 07/22/2013 2145   LYMPHSABS 1.4 04/26/2008 1100   MONOABS 0.4 07/22/2013 2145   MONOABS 0.4 04/26/2008 1100   EOSABS 0.0 07/22/2013 2145   EOSABS 0.0 04/26/2008 1100   BASOSABS 0.0 07/22/2013 2145   BASOSABS 0.0 04/26/2008 1100    CMP     Component Value Date/Time   NA 140 02/02/2017 1508   K 4.3 02/02/2017 1508   CL 107 02/02/2017 1508   CO2 26 02/02/2017 1508   GLUCOSE 117 (H) 02/02/2017 1508   BUN 22 (H) 02/02/2017 1508   CREATININE 1.18 02/02/2017 1508   CREATININE 1.13 07/21/2016 1038   CALCIUM 9.0 02/02/2017 1508   PROT 5.5 (L) 02/02/2017 1508   ALBUMIN 3.7 02/02/2017 1508   AST 27 02/02/2017 1508   ALT 15 (L) 02/02/2017 1508   ALKPHOS 70 02/02/2017 1508   BILITOT 0.8 02/02/2017 1508   GFRNONAA 60 (L) 02/02/2017 1508   GFRAA >60 02/02/2017 1508    ASSESSMENT/PLAN: 71 year old male with a past medical history of chronic idiopathic constipation, factor V Leiden deficiency on warfarin, history of atrial fibrillation, hypertension, CAD, history of prostate cancer status post prostatectomy, diabetes and Alzheimer's who is seen in consultation at the request of Dr. Earle [name] to establish care for his GI issues.  1.  Chronic idiopathic constipation -- long discussion today regarding constipation management. I do not think he has been consistent with MiraLAX  to deem it a failure. I  recommended that he take MiraLAX 17 g daily on a regular uninterrupted basis for 10 days. If he still feels constipated I would like him to increase to twice a day dosing 10 days. If after 10 days he is still having constipation I recommended that he contact me and we can discuss other options such as a trial of Linzess or Amitiza. He can still use glycerin suppositories as needed.  2. CRC screening -- repeat colonoscopy recommended February 2020    KS:[facility]NGIT, Herbie [place], [place] [address] [place], [place] [address]   Jacob [name], M.D.

## 2017-06-19 NOTE — Patient Instructions (Signed)
We have sent the following medications to your pharmacy for you to pick up at your convenience: Miralax-Take 17 grams (1 capful) dissolve in at least 8 ounces water/juice once daily x 10 days. If you are still constipated after this, increase to 17 grams twice daily. If this continues to be ineffective, please call our office at [phone].  Please purchase the following medications over the counter and take as directed: Glycerin suppositories as needed  You will be due for a recall colonoscopy in 12/2018.  We will send you a reminder in the mail when it gets closer to that time.  Please follow up with Dr Hilarie Fredrickson in 1 year in the office, sooner if needed.  If you are age 73 or older, your body mass index should be between 23-30. Your Body mass index is 26.73 kg/m. If this is out of the aforementioned range listed, please consider follow up with your Primary Care Provider.  If you are age 16 or younger, your body mass index should be between 19-25. Your Body mass index is 26.73 kg/m. If this is out of the aformentioned range listed, please consider follow up with your Primary Care Provider.

## 2017-06-26 DIAGNOSIS — E538 Deficiency of other specified B group vitamins: Secondary | ICD-10-CM | POA: Diagnosis not present

## 2017-06-26 DIAGNOSIS — Z7901 Long term (current) use of anticoagulants: Secondary | ICD-10-CM | POA: Diagnosis not present

## 2017-06-30 ENCOUNTER — Ambulatory Visit (INDEPENDENT_AMBULATORY_CARE_PROVIDER_SITE_OTHER): Payer: Medicare Other | Admitting: Podiatry

## 2017-06-30 ENCOUNTER — Encounter: Payer: Self-pay | Admitting: Podiatry

## 2017-06-30 VITALS — BP 154/92 | HR 64

## 2017-06-30 DIAGNOSIS — L02612 Cutaneous abscess of left foot: Secondary | ICD-10-CM | POA: Diagnosis not present

## 2017-06-30 DIAGNOSIS — L03032 Cellulitis of left toe: Secondary | ICD-10-CM | POA: Diagnosis not present

## 2017-06-30 NOTE — Progress Notes (Signed)
Patient ID: Jacob [name], male   DOB: 05/25/1946, 71 y.o.   MRN: [id number]   Subjective: This patient presents today with his wife present in the treatment room. Patient describes approximately three-day history of pain in or around the left hallux toenail aggravated with standing walking and relieved with rest and elevation. Patient describes applying cortisone cream and the pain resolved  Denies smoking history  Objective: Patient appears confused and has difficulty responding to questioning DP and PT pulses 2/4 bilaterally Capillary reflex immediate bilaterally  Neurological: Patient has difficulty responding to 10 g monofilament wire Patient has difficulty responding to vibratory sensation Ankle reflexes reactive bilaterally  Dermatological: No open skin lesions bilaterally Atrophic skin with absent hair growth bilaterally The left hallux nail is hypertrophic, deformed. The left hallux nail was debrided releasing a serous fluid in the entire nail plate essentially was debrided from the underlying nailbed. An underlying male nailbed has clean granular appearance with slight vascular bleeding.  Musculoskeletal: Manual motor testing dorsi flexion, plantar flexion 5/5 bilaterally   Assessment: Abscess/paronychia left hallux Mycotic left hallux toenail  Plan: Debridement of left hallux nail plate releasing a serous fluid. An antibiotic dressing was applied Postoperative soft soap (recommended with application of topical antibiotic ointment Patient and patient's wife instructed observe for any sudden increase of swelling, redness, drainage contact office if noted  Reappoint at patient's request

## 2017-06-30 NOTE — Progress Notes (Signed)
Patient ID: Jacob [name], male   DOB: Jan 16, 1946, 71 y.o.   MRN: [id number]   Subjective: This patient presents today with his wife present treatment room

## 2017-06-30 NOTE — Patient Instructions (Signed)
ANTIBACTERIAL SOAP OR SOFT SOAP INSTRUCTIONS  THE DAY AFTER PROCEDURE  Please follow the instructions your doctor has marked.   Shower as usual. Before getting out, place a drop of antibacterial liquid soap (Dial) on a wet, clean washcloth.  Gently wipe washcloth over affected area.  Afterward, rinse the area with warm water.  Blot the area dry with a soft cloth and cover with antibiotic ointment (neosporin, polysporin, bacitracin) and band aid or gauze and tape  Place 3-4 drops of antibacterial liquid soap in a quart of warm tap water.  Submerge foot into water for 2 minutes.  If bandage was applied after your procedure, leave on to allow for easy lift off, then remove and continue with soak for the remaining time.  Next, blot area dry with a soft cloth and cover with a bandage.  Apply triple antibiotic ointment  If you notice any sudden increased pain, swelling, redness contact our office

## 2017-06-30 NOTE — Progress Notes (Signed)
   Subjective:    Patient ID: Jacob [name], male    DOB: [birth year], 71 y.o.   MRN: [id number]  HPI    Review of Systems  All other systems reviewed and are negative.      Objective:   Physical Exam        Assessment & Plan:

## 2017-07-03 IMAGING — CT CT ANGIO CHEST
2 of 8 series · 18 of 46 positions shown · IV contrast (ISOVUE 370)
Comparison: CT abdomen 02/26/2015, chest 06/01/2014

CLINICAL DATA: F/u to aortic aneurysm

EXAM:
CT ANGIOGRAPHY CHEST WITH CONTRAST
TECHNIQUE: Multidetector CT imaging of the chest was performed using the
standard protocol during bolus administration of intravenous
contrast. Multiplanar CT image reconstructions and MIPs were
obtained to evaluate the vascular anatomy.
CONTRAST:  100 mL Isovue 370 IV

[Series 5: thins · axial · 0.90mm/px · z∈[+1066,+1360]mm · 15 of 331 slices shown]
[im 19/331  lung]
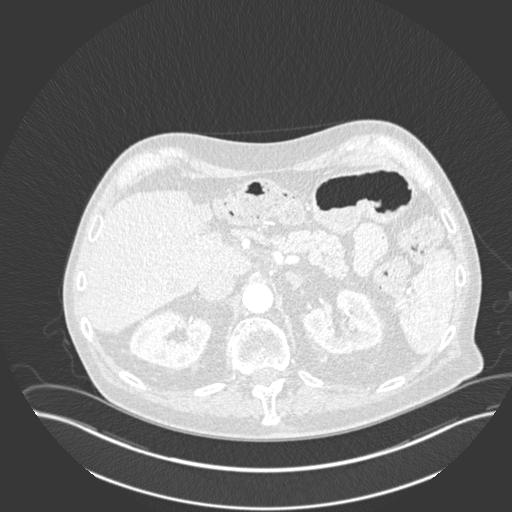
[im 37/331  soft-tissue]
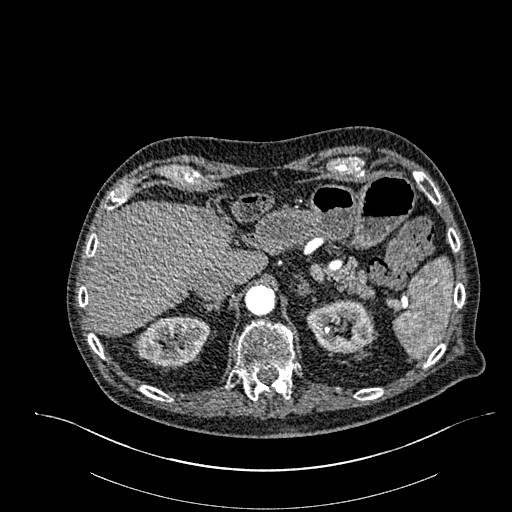
[im 56/331  lung]
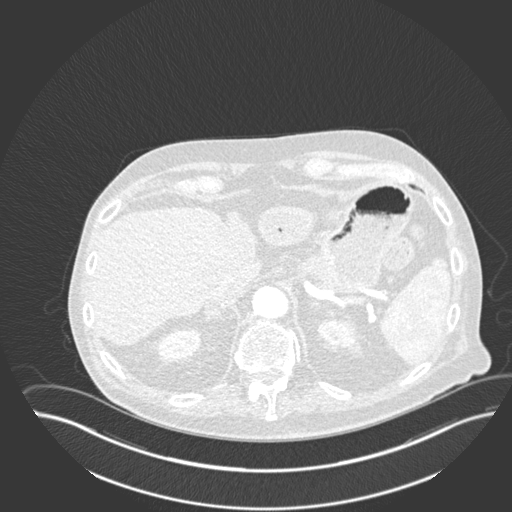
[im 74/331  soft-tissue]
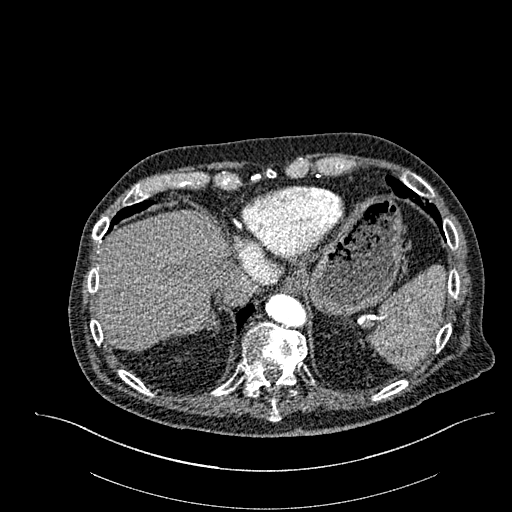
[im 111/331  lung]
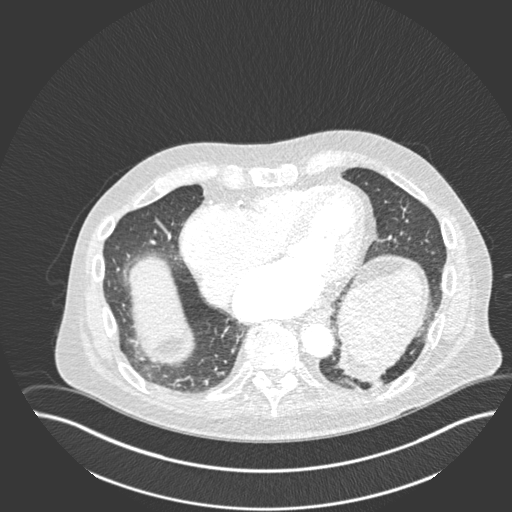
[im 129/331  soft-tissue]
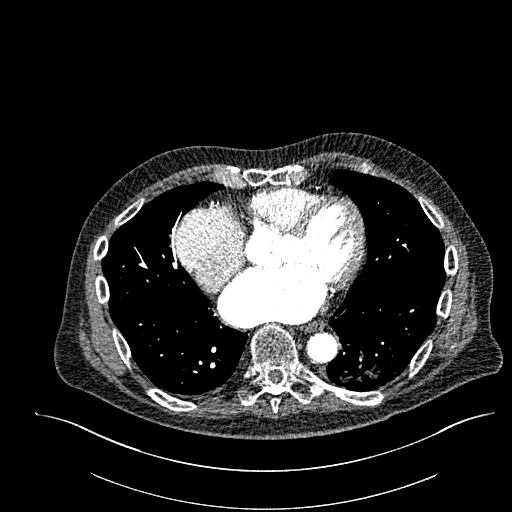
[im 147/331  lung]
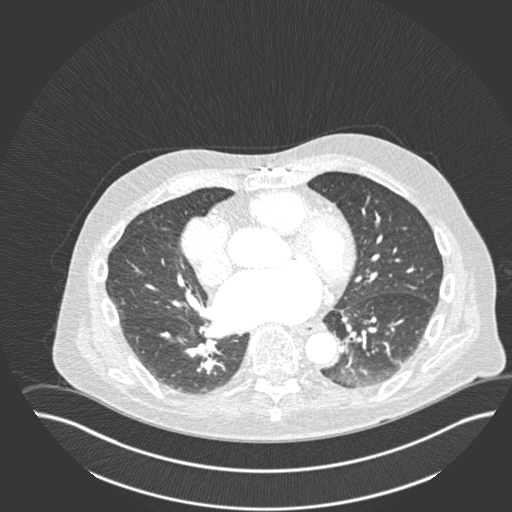
[im 166/331  soft-tissue]
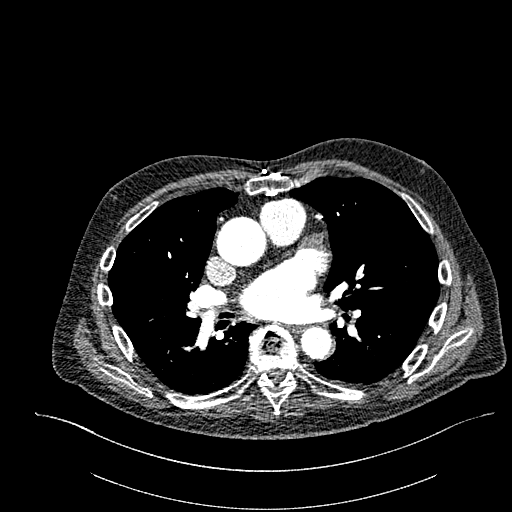
[im 184/331  lung]
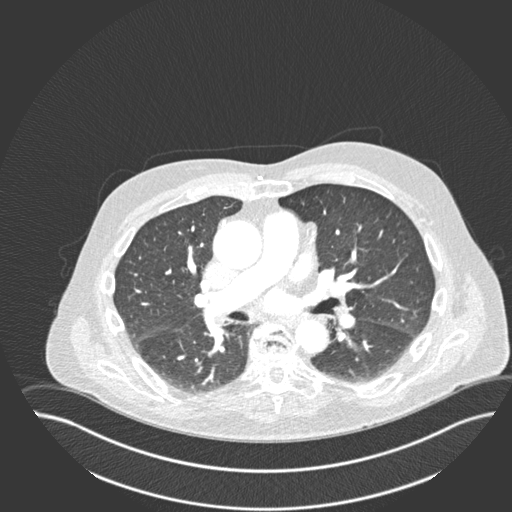
[im 202/331  soft-tissue]
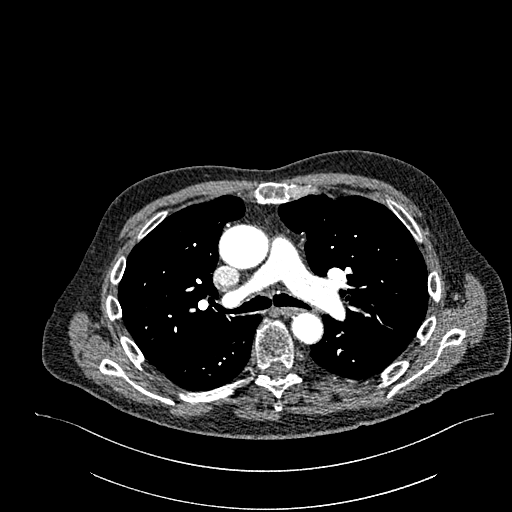
[im 221/331  lung]
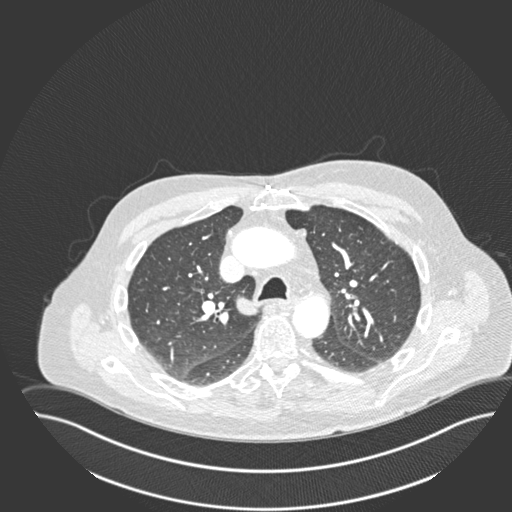
[im 257/331  soft-tissue]
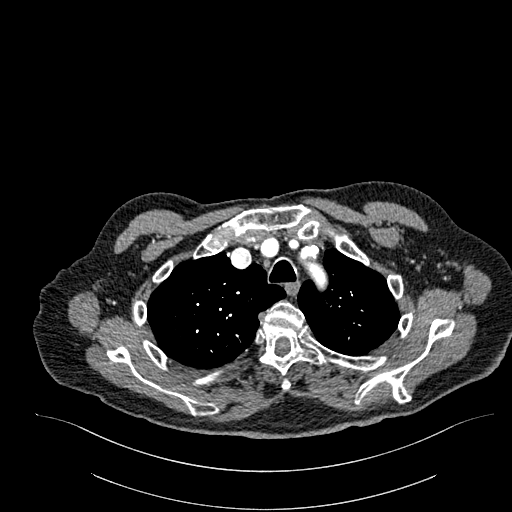
[im 276/331  lung]
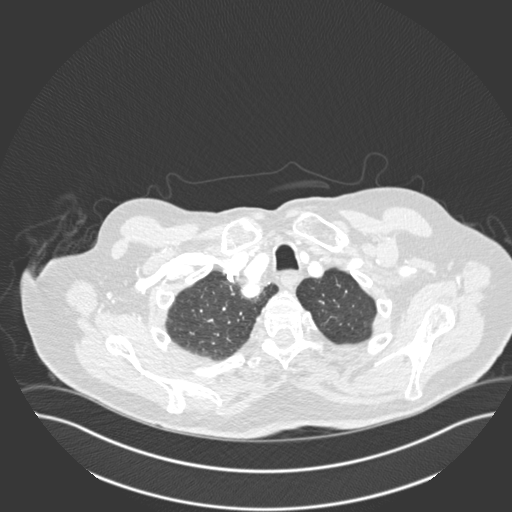
[im 294/331  soft-tissue]
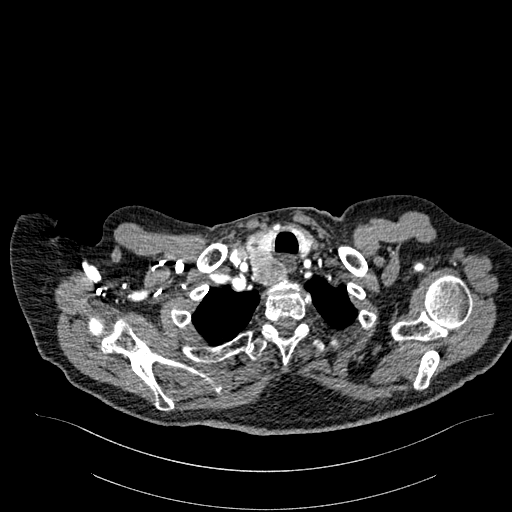
[im 312/331  lung]
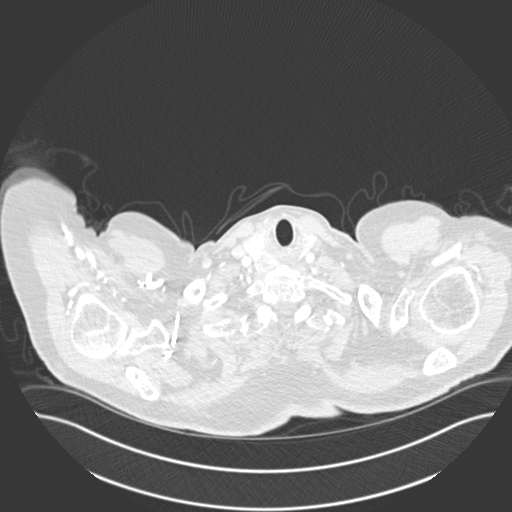

[Series 7: coronal mpr · coronal · 0.64mm/px · 3 of 151 slices shown]
[im 38/151  soft-tissue]
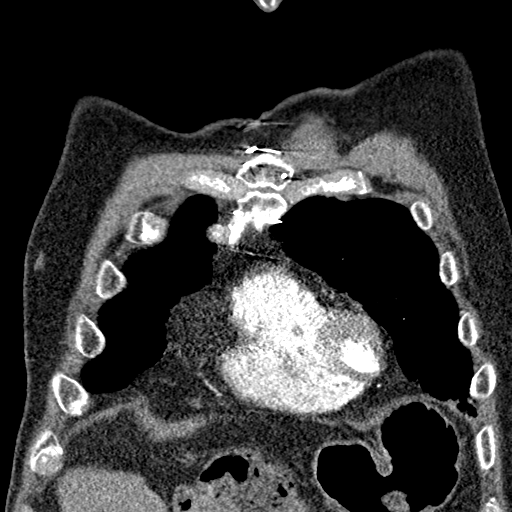
[im 76/151  soft-tissue]
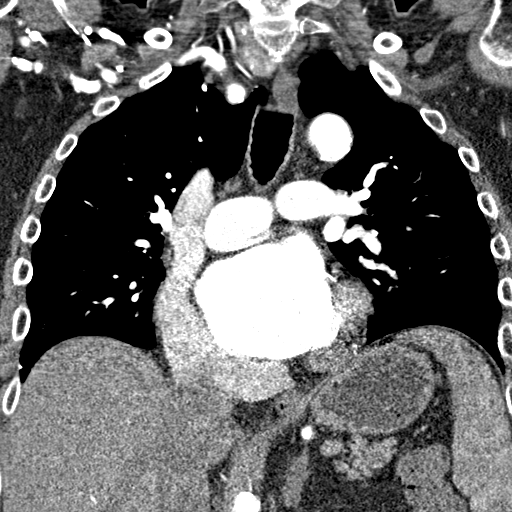
[im 113/151  soft-tissue]
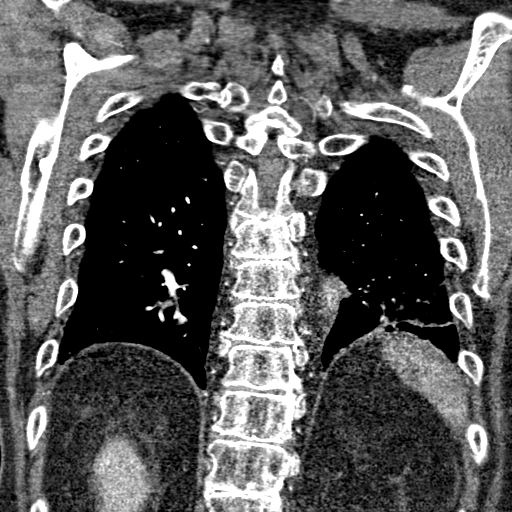

[18 of 46 positions shown; findings below may reference images not displayed]

FINDINGS: Vascular: Right arm IV contrast administration. The SVC is patent.
Right atrial enlargement. Mild right ventral to the ventricular
dilatation. Satisfactory opacification of pulmonary arteries noted,
and there is no evidence of pulmonary emboli. Patent bilateral
pulmonary veins drain into the left atrium. Left atrial enlargement.
Incomplete opacification distally in the left atrial appendage
suggesting thrombus; findings reviewed with Dr. Viloria, who
concurs. Moderate coronary calcifications. Previous CABG. Thoracic
aortic segmental diameters as follows:

4.4 cm sinuses of Valsalva

3.6 cm Streater junction

4.5 cm proximal ascending (previously 4.4 cm)

3.4 cm distal ascending/proximal arch

2.7 cm distal arch

3.3 cm proximal descending

2.5 cm distal descending

Scattered atheromatous calcifications in the distal arch and
descending thoracic segment. No intra luminal thrombus. No
dissection or stenosis. Classic 3 vessel brachiocephalic arterial
origin anatomy without proximal stenosis. Tortuous distal descending
segment.

Mediastinum/Lymph Nodes: 3.6 cm mass from the inferior margin right
lobe of the thyroid. No hilar or mediastinal adenopathy. No
pericardial effusion.

Lungs/Pleura: Patchy infiltrate or subsegmental atelectasis in
posterior basal segment left lower lobe. Minimal dependent
atelectasis posteriorly in both lower lobes. No pleural effusion. No
pneumothorax.

Upper abdomen: No acute findings.

Musculoskeletal: Stable T7 and L1 compression deformities. Previous
median sternotomy.

Review of the MIP images confirms the above findings.
IMPRESSION: 1. 4.5 cm ascending aortic aneurysm without complicating features.
Recommend semi-annual imaging followup by CTA or MRA and referral to
cardiothoracic surgery if not already obtained. This recommendation
follows 0141 ACCF/AHA/AATS/ACR/ASA/SCA/JIMMY JIM/MORLES/KLEVER/ALIPIO Guidelines
for the Diagnosis and Management of Patients With Thoracic Aortic
Disease. Circulation. 0141; 121: [id number]
2. Thrombus versus incomplete opacification in the left atrial
appendage. Correlate with STEPHAN.
3. Patchy infiltrate or subsegmental atelectasis, posterior left
lower lobe.
4. Right thyroid mass as previously described.

## 2017-07-13 ENCOUNTER — Other Ambulatory Visit: Payer: Self-pay | Admitting: Neurology

## 2017-07-13 ENCOUNTER — Other Ambulatory Visit: Payer: Self-pay | Admitting: Interventional Cardiology

## 2017-07-21 NOTE — Progress Notes (Signed)
Cardiology Office Note    Date:  07/22/2017   ID:  Jacob [name], Jacob [name] [birth year], MRN [id number]  PCP:  Josetta Huddle, MD  Cardiologist: Sinclair Grooms, MD   Chief Complaint  Patient presents with  . Atrial Fibrillation  . Coronary Artery Disease    History of Present Illness:  Jacob [name] is a 71 y.o. male chronic atrial fibrillation, chronic anticoagulation, coronary artery disease, ascending aortic aneurysm, hypertension, and COPD  Looks to his wife for confirmation of answers that he gives to questions. Normal diagnosis of Alzheimer's has been made. Denies syncope, chest pain, orthopnea, PND, and edema. Not exercising.  Reviewed laboratory data from [name] and note an LDL cholesterol of 131 total cholesterol 233. Since the diagnosis of Alzheimer's, his statin therapy has been completely discontinued.  Past Medical History:  Diagnosis Date  . Adrenal adenoma    bilateral  . Ascending aortic aneurysm ([place])    monitored by dr dr Daneen Schick-  stable 4.4cm x 4.3cm per CT 06-01-2014  . At risk for sleep apnea    STOP-BANG= 6     SENT TO PCP 03-05-2015  . Borderline diabetes mellitus   . Chronic anticoagulation   . Chronic atrial fibrillation ([place])   . Chronic diastolic heart failure ([place])   . Chronic idiopathic constipation   . Coronary artery disease    cardiologist-  dr Daneen Schick--  s/p  CABG x2 2000/   last myoview 2012-- normal  . Diabetes mellitus without complication ([place])   . Factor 5 Leiden mutation, heterozygous ()   . Gross hematuria   . History of adenomatous polyp of colon    2009  . History of basal cell carcinoma excision    forehead  . History of DVT of lower extremity    x2 1980's  . History of gout   . History of prostate cancer oncologist --dr Rafael Bihari [facility]--  no recurrence since 2013   dx 2004--  s/p  radical retropubic prostatectomy/  recurrence 2006  and 2013 s/p radiation at [facility]  and completed Hormone tx  .  Hx of radiation therapy    external beam proton radiation 2006  . Hypertension   . Left ureteral stone   . Multiple thyroid nodules    Benign bx done July 2014  . OA (osteoarthritis)   . Radiation proctitis   . Recovering alcoholic in remission ([place])   . S/P CABG x 2    09-17-1999  . Secondary pulmonary hypertension     Past Surgical History:  Procedure Laterality Date  . CARDIAC CATHETERIZATION  09-03-1999  dr Daneen Schick   Abnormal myoview/  80% mLAD,  total occlusion pRCA , collaterals from pRCA segment and left CFX and LAD,  OM1 sig. lesion,  total occlusion very small OM2,  persived LVSF,  elevated end-diastolic pressure  . CARDIOVASCULAR STRESS TEST  03-04-2011   dr Daneen Schick   normal perfusion study/  attenuation artifact in inferior region of myocardium,  no ischemia or infarct/scar,  unable to gate wall motion due to irregular rhythm  . COLONOSCOPY WITH PROPOFOL [name] 12/27/2013   Procedure: COLONOSCOPY WITH PROPOFOL;  Surgeon: Garlan Fair, MD;  Location: [facility];  Service: Endoscopy;  Laterality: [name];  . CORONARY ARTERY BYPASS GRAFT  09-17-1999   dr gerhardt   x2 vessel--  LIMA to LAD and RCA  . CYSTOSCOPY WITH RETROGRADE PYELOGRAM, URETEROSCOPY AND STENT PLACEMENT Left 03/07/2015   Procedure: CYSTOSCOPY  WITH RETROGRADE PYELOGRAM, URETEROSCOPY  WITH STONE EXTRACTION AND STENT PLACEMENT;  Surgeon: Rana Snare, MD;  Location: [facility];  Service: Urology;  Laterality: Left;  . EXCISIONAL BX OF MASS, LEFT DISTAL BRACHIUM  08-09-2002  . HEMORRHOID SURGERY  2000  . HOLMIUM LASER APPLICATION Left 3/81/8299   Procedure: HOLMIUM LASER APPLICATION;  Surgeon: Rana Snare, MD;  Location: [facility];  Service: Urology;  Laterality: Left;  . KNEE SURGERY    . LUMBAR DISKECOTMY AND FUSION  1972   L4 -- L5  . RADICAL RETROPUBIC PROSTATECTOMY  02-20-2003  . TONSILLECTOMY  as child  . TOTAL HIP ARTHROPLASTY Left 01/18/2013   Procedure: TOTAL HIP  ARTHROPLASTY;  Surgeon: Garald Balding, MD;  Location: [facility];  Service: Orthopedics;  Laterality: Left;  Left Total Hip Arthroplasty  . TRANSTHORACIC ECHOCARDIOGRAM  05-10-2013  dr Daneen Schick   mild LVH/  55-60%/  severe LAE/  mild to moderate MR and TR/  mild RAE/  moderate to severe RV elevated systolic pressure/  IVC demonstates <50% collapse w/ respiration/  due to atrial fib. unable evaluate diastolic function    Current Medications: Outpatient Medications Prior to Visit  Medication Sig Dispense Refill  . carvedilol (COREG) 3.125 MG tablet take 1 tablet by mouth twice a day with meals 30 tablet 0  . Cholecalciferol (VITAMIN D3) 2000 units TABS Take 1 tablet by mouth daily.     . colchicine 0.6 MG tablet Take 0.6 mg by mouth 2 (two) times daily as needed (gout).     . Cyanocobalamin (B-12 COMPLIANCE INJECTION IJ) Inject as directed every 30 (thirty) days.     Marland Kitchen dutasteride (AVODART) 0.5 MG capsule Take 0.5 mg by mouth daily.    Marland Kitchen escitalopram (LEXAPRO) 10 MG tablet Take 10 mg by mouth every morning.    . folic acid (FOLVITE) 1 MG tablet Take 1 mg by mouth daily.    . furosemide (LASIX) 40 MG tablet Take 40 mg by mouth daily.    . memantine (NAMENDA) 10 MG tablet take 1 tablet by mouth twice a day 180 tablet 0  . metformin (FORTAMET) 1000 MG (OSM) 24 hr tablet Take 1,000 mg by mouth 2 (two) times daily with a meal.     . polyethylene glycol powder (GLYCOLAX/MIRALAX) powder Dissolve 17 grams (1 capful) in at least 8 ounces water/juice and drink twice daily 1020 g 5  . ramipril (ALTACE) 2.5 MG capsule Take 2.5 mg by mouth daily.    Marland Kitchen warfarin (COUMADIN) 5 MG tablet Take 1-1.5 tablets (5-7.5 mg total) by mouth daily. Take 1 tablet daily except on Sundays take 1 1/2 tablet (7.5mg )     No facility-administered medications prior to visit.      Allergies:   Percocet [oxycodone-acetaminophen]; Allopurinol; Crestor [rosuvastatin calcium]; and Dilaudid [hydromorphone hcl]   Social History    Social History  . Marital status: Married    Spouse name: [name]  . Number of children: [name]  . Years of education: [name]   Occupational History  . retired Forensic psychologist    Social History Main Topics  . Smoking status: Former Smoker    Packs/day: 1.00    Years: 15.00    Quit date: 11/24/1985  . Smokeless tobacco: Never Used  . Alcohol use No     Comment: Recovery alcoholic --  attends AA  . Drug use: No  . Sexual activity: Not Asked   Other Topics Concern  . None   Social History  Narrative  . None     Family History:  The patient's family history includes CAD in his mother; Cancer in his mother; Heart attack in his father and mother; Heart failure in his father; Thyroid disease in his father; Valvular heart disease in his father.   ROS:   Please see the history of present illness: Complaints include decreased memory, frailty, some concern statins cause weight loss and aggravated memory loss.  All other systems reviewed and are negative.   PHYSICAL EXAM:   VS:  BP 120/60 (BP Location: Left Arm)   Pulse (!) 59   Ht 6\' 3"  (1.905 m)   Wt 210 lb 3.2 oz (95.3 kg)   BMI 26.27 kg/m    GEN: Well nourished, well developed, in no acute distress  HEENT: normal  Neck: no JVD, carotid bruits, or masses Cardiac: IIRR; no murmurs, rubs, or gallops,no edema  Respiratory:  clear to auscultation bilaterally, normal work of breathing GI: soft, nontender, nondistended, + BS MS: no deformity or atrophy  Skin: warm and dry, no rash Neuro:  Alert and Oriented x 3, Strength and sensation are intact Psych: euthymic mood, full affect  Wt Readings from Last 3 Encounters:  07/22/17 210 lb 3.2 oz (95.3 kg)  06/19/17 208 lb 3.2 oz (94.4 kg)  05/28/17 213 lb 3.2 oz (96.7 kg)      Studies/Labs Reviewed:   EKG:  EKG  02/03/2017 atrial fibrillation with controlled ventricular response.  Recent Labs: 02/02/2017: ALT 15; BUN 22; Creatinine, Ser 1.18; Hemoglobin 13.0; Magnesium 2.1; Platelets 88;  Potassium 4.3; Sodium 140   Lipid Panel No results found for: CHOL, TRIG, HDL, CHOLHDL, VLDL, LDLCALC, LDLDIRECT  Additional studies/ records that were reviewed today include:   CT scan of chest with contrast: Performed 05/14/2017 IMPRESSION: Stable aneurysmal dilatation of the ascending aorta at 4.5 cm.  Stable low-density in the left atrial appendage. Chronic thrombus is not excluded.  Aortic Atherosclerosis (ICD10-I70.0).   ASSESSMENT:    1. Chronic atrial fibrillation ([place])   2. Essential hypertension, benign   3. Coronary artery disease involving coronary bypass graft of native heart without angina pectoris   4. Hyperlipidemia LDL goal <70   5. Ascending aortic aneurysm ([place])   6. Chronic diastolic heart failure ([facility])   7. Pulmonary hypertension ([facility])      PLAN:  In order of problems listed above:  1. Clinically in atrial fibrillation and with excellent rate control. No adjustments are necessary. 2. Blood pressure is under excellent control at the current level of 120/60 mmHg. Low-dose beta blocker would help prevent aneurysm expansion. 3. Asymptomatic. I do have concern about the absence of therapy for elevated lipids. LDL was twice the guideline target. Statin therapy was discontinued because of concerns about memory. Will discuss with other providers. 4. LDL is 131 in June. I will be in favor of resuming therapy for lipid lowering. Will discuss with other providers. 5. Keep beta blocker therapy as noted. Aortic root size is stable. 6. No evidence of volume overload  Plan clinical follow-up in one year. Discussed the issue with elevated lipids. Will discuss with other providers.   Medication Adjustments/Labs and Tests Ordered: Current medicines are reviewed at length with the patient today.  Concerns regarding medicines are outlined above.  Medication changes, Labs and Tests ordered today are listed in the Patient Instructions below. Patient Instructions    Medication Instructions:  None  Labwork: None  Testing/Procedures: None  Follow-Up: Your physician wants you to follow-up in:  1 year with Dr. Tamala Julian.  You will receive a reminder letter in the mail two months in advance. If you don't receive a letter, please call our office to schedule the follow-up appointment.   Any Other Special Instructions Will Be Listed Below (If Applicable).     If you need a refill on your cardiac medications before your next appointment, please call your pharmacy.      Signed, Sinclair Grooms, MD  07/22/2017 10:20 AM    [facility] [address], [place], [place]  [address] Phone: [phone]; Fax: [phone]

## 2017-07-22 ENCOUNTER — Encounter: Payer: Self-pay | Admitting: Interventional Cardiology

## 2017-07-22 ENCOUNTER — Ambulatory Visit (INDEPENDENT_AMBULATORY_CARE_PROVIDER_SITE_OTHER): Payer: Medicare Other | Admitting: Interventional Cardiology

## 2017-07-22 VITALS — BP 120/60 | HR 59 | Ht 75.0 in | Wt 210.2 lb

## 2017-07-22 DIAGNOSIS — E785 Hyperlipidemia, unspecified: Secondary | ICD-10-CM | POA: Diagnosis not present

## 2017-07-22 DIAGNOSIS — I482 Chronic atrial fibrillation, unspecified: Secondary | ICD-10-CM

## 2017-07-22 DIAGNOSIS — I5032 Chronic diastolic (congestive) heart failure: Secondary | ICD-10-CM | POA: Diagnosis not present

## 2017-07-22 DIAGNOSIS — I272 Pulmonary hypertension, unspecified: Secondary | ICD-10-CM

## 2017-07-22 DIAGNOSIS — I10 Essential (primary) hypertension: Secondary | ICD-10-CM | POA: Diagnosis not present

## 2017-07-22 DIAGNOSIS — I25810 Atherosclerosis of coronary artery bypass graft(s) without angina pectoris: Secondary | ICD-10-CM | POA: Diagnosis not present

## 2017-07-22 DIAGNOSIS — I712 Thoracic aortic aneurysm, without rupture: Secondary | ICD-10-CM | POA: Diagnosis not present

## 2017-07-22 DIAGNOSIS — I7121 Aneurysm of the ascending aorta, without rupture: Secondary | ICD-10-CM

## 2017-07-22 NOTE — Progress Notes (Addendum)
Hallo Hank-  Yes , I think that you may have to start him back. There are concerns about dementia and statins- he has documented CAD, atrial fib disease. I would go with a non statin if there is one effective enough - Some colleagues use qod dosing for statins. That may work ?   Let me do some research with Pramod's help. CD    Odessa Nishi, MD

## 2017-07-22 NOTE — Patient Instructions (Signed)

## 2017-07-23 NOTE — Progress Notes (Signed)
What is the data on dementia and statins that I should know.

## 2017-07-24 DIAGNOSIS — E538 Deficiency of other specified B group vitamins: Secondary | ICD-10-CM | POA: Diagnosis not present

## 2017-07-24 DIAGNOSIS — Z7901 Long term (current) use of anticoagulants: Secondary | ICD-10-CM | POA: Diagnosis not present

## 2017-07-29 ENCOUNTER — Telehealth: Payer: Self-pay | Admitting: Neurology

## 2017-07-29 NOTE — Telephone Encounter (Signed)
Dr. Pernell Dupre, the patient's cardiologist, had contacted me by a staff message requesting my input as to probably starting a statin therapy.  I responded with reluctance, as we have anecdotal evidence that statins can increase dementia symptoms. The patient's PCP had similar concerns. Dr Inda Merlin-  I spoke with Mr. Maue and his wife today by phone, and they have plans to re-implement a mediterranean diet that he used to be on- with  good success to control cholesterol.  Over the last 6 months or so he seemed to have slipped and especially consumes more baked sweets and ice cream. I like for him to resume a more plant-based diet, and Mr. Mrs. Luviano a willing to try this first, and then have a new evaluation of cholesterol response adequately or not. I would otherwise prefer to use Zetia over a statin.  Larey Seat, MD

## 2017-07-30 ENCOUNTER — Other Ambulatory Visit: Payer: Self-pay | Admitting: *Deleted

## 2017-07-30 MED ORDER — CARVEDILOL 3.125 MG PO TABS: 3.1250 mg | ORAL_TABLET | Freq: Two times a day (BID) | ORAL | 3 refills | Status: DC

## 2017-07-30 NOTE — Telephone Encounter (Signed)
Left message to call back  

## 2017-07-30 NOTE — Telephone Encounter (Signed)
Thank you very much. We will start Zetia 10 mg per day. Anderson Malta please see that the patient gets a prescription for Zetia.

## 2017-07-31 ENCOUNTER — Telehealth: Payer: Self-pay | Admitting: Interventional Cardiology

## 2017-07-31 DIAGNOSIS — H2513 Age-related nuclear cataract, bilateral: Secondary | ICD-10-CM | POA: Diagnosis not present

## 2017-07-31 DIAGNOSIS — H524 Presbyopia: Secondary | ICD-10-CM | POA: Diagnosis not present

## 2017-07-31 DIAGNOSIS — H25043 Posterior subcapsular polar age-related cataract, bilateral: Secondary | ICD-10-CM | POA: Diagnosis not present

## 2017-07-31 NOTE — Telephone Encounter (Signed)
Spoke with wife and went over recommendations per Dr. Tamala Julian.  Wife verbalized understanding and was in agreement with this plan.  Pt seeing PCP soon and she will discuss with [name].

## 2017-07-31 NOTE — Telephone Encounter (Signed)
See phone note from 9/5. 

## 2017-07-31 NOTE — Telephone Encounter (Signed)
Zetia is not a statin. It works by decreasing absorption of fat from the intestinal tract. For the time being, let them diet and follow-up with the other providers.. I will also hand this problem off to primary care and neurology.

## 2017-07-31 NOTE — Telephone Encounter (Signed)
Spoke with wife, DPR on file.  Advised her of recommendations per Dr. Tamala Julian.  Wife states that she spoke with Dr. Brett Fairy and she told them she really preferred pt not be on a statin at all and just work on diet.  Wife states pt on strict diet and they have cut out "treats" to help improve cholesterol as well.  Wife states they prefer to continue to work on diet and see how pt does.  Advised I would send message to Dr. Tamala Julian to let him know and see when he may want f/u labs to see how diet is doing? Wife appreciative for call.

## 2017-07-31 NOTE — Telephone Encounter (Signed)
New Message    Pt wife is returning your call please call her after 5pm , or leave detailed message on voicemail

## 2017-07-31 NOTE — Telephone Encounter (Signed)
Left message to call back  

## 2017-08-21 DIAGNOSIS — Z23 Encounter for immunization: Secondary | ICD-10-CM | POA: Diagnosis not present

## 2017-08-21 DIAGNOSIS — I4891 Unspecified atrial fibrillation: Secondary | ICD-10-CM | POA: Diagnosis not present

## 2017-08-21 DIAGNOSIS — Z7901 Long term (current) use of anticoagulants: Secondary | ICD-10-CM | POA: Diagnosis not present

## 2017-08-21 DIAGNOSIS — E538 Deficiency of other specified B group vitamins: Secondary | ICD-10-CM | POA: Diagnosis not present

## 2017-08-28 DIAGNOSIS — Z7901 Long term (current) use of anticoagulants: Secondary | ICD-10-CM | POA: Diagnosis not present

## 2017-09-01 DIAGNOSIS — H25811 Combined forms of age-related cataract, right eye: Secondary | ICD-10-CM | POA: Diagnosis not present

## 2017-09-01 DIAGNOSIS — H25041 Posterior subcapsular polar age-related cataract, right eye: Secondary | ICD-10-CM | POA: Diagnosis not present

## 2017-09-01 DIAGNOSIS — H2511 Age-related nuclear cataract, right eye: Secondary | ICD-10-CM | POA: Diagnosis not present

## 2017-09-14 DIAGNOSIS — I272 Pulmonary hypertension, unspecified: Secondary | ICD-10-CM | POA: Diagnosis not present

## 2017-09-14 DIAGNOSIS — E538 Deficiency of other specified B group vitamins: Secondary | ICD-10-CM | POA: Diagnosis not present

## 2017-09-14 DIAGNOSIS — Z7901 Long term (current) use of anticoagulants: Secondary | ICD-10-CM | POA: Diagnosis not present

## 2017-09-14 DIAGNOSIS — G309 Alzheimer's disease, unspecified: Secondary | ICD-10-CM | POA: Diagnosis not present

## 2017-09-14 DIAGNOSIS — I2510 Atherosclerotic heart disease of native coronary artery without angina pectoris: Secondary | ICD-10-CM | POA: Diagnosis not present

## 2017-09-14 DIAGNOSIS — D696 Thrombocytopenia, unspecified: Secondary | ICD-10-CM | POA: Diagnosis not present

## 2017-09-14 DIAGNOSIS — E559 Vitamin D deficiency, unspecified: Secondary | ICD-10-CM | POA: Diagnosis not present

## 2017-09-14 DIAGNOSIS — I10 Essential (primary) hypertension: Secondary | ICD-10-CM | POA: Diagnosis not present

## 2017-09-14 DIAGNOSIS — M109 Gout, unspecified: Secondary | ICD-10-CM | POA: Diagnosis not present

## 2017-09-14 DIAGNOSIS — I4891 Unspecified atrial fibrillation: Secondary | ICD-10-CM | POA: Diagnosis not present

## 2017-09-14 DIAGNOSIS — C61 Malignant neoplasm of prostate: Secondary | ICD-10-CM | POA: Diagnosis not present

## 2017-09-14 DIAGNOSIS — I719 Aortic aneurysm of unspecified site, without rupture: Secondary | ICD-10-CM | POA: Diagnosis not present

## 2017-09-25 DIAGNOSIS — Z7901 Long term (current) use of anticoagulants: Secondary | ICD-10-CM | POA: Diagnosis not present

## 2017-09-25 DIAGNOSIS — E538 Deficiency of other specified B group vitamins: Secondary | ICD-10-CM | POA: Diagnosis not present

## 2017-09-30 DIAGNOSIS — L820 Inflamed seborrheic keratosis: Secondary | ICD-10-CM | POA: Diagnosis not present

## 2017-09-30 DIAGNOSIS — Z85828 Personal history of other malignant neoplasm of skin: Secondary | ICD-10-CM | POA: Diagnosis not present

## 2017-10-01 DIAGNOSIS — F341 Dysthymic disorder: Secondary | ICD-10-CM | POA: Diagnosis not present

## 2017-10-01 DIAGNOSIS — G309 Alzheimer's disease, unspecified: Secondary | ICD-10-CM | POA: Diagnosis not present

## 2017-10-01 DIAGNOSIS — I10 Essential (primary) hypertension: Secondary | ICD-10-CM | POA: Diagnosis not present

## 2017-10-01 DIAGNOSIS — E782 Mixed hyperlipidemia: Secondary | ICD-10-CM | POA: Diagnosis not present

## 2017-10-01 DIAGNOSIS — D649 Anemia, unspecified: Secondary | ICD-10-CM | POA: Diagnosis not present

## 2017-10-01 DIAGNOSIS — Z8546 Personal history of malignant neoplasm of prostate: Secondary | ICD-10-CM | POA: Diagnosis not present

## 2017-10-01 DIAGNOSIS — I5032 Chronic diastolic (congestive) heart failure: Secondary | ICD-10-CM | POA: Diagnosis not present

## 2017-10-01 DIAGNOSIS — I4891 Unspecified atrial fibrillation: Secondary | ICD-10-CM | POA: Diagnosis not present

## 2017-10-01 DIAGNOSIS — C61 Malignant neoplasm of prostate: Secondary | ICD-10-CM | POA: Diagnosis not present

## 2017-10-01 DIAGNOSIS — I2510 Atherosclerotic heart disease of native coronary artery without angina pectoris: Secondary | ICD-10-CM | POA: Diagnosis not present

## 2017-10-06 DIAGNOSIS — H25812 Combined forms of age-related cataract, left eye: Secondary | ICD-10-CM | POA: Diagnosis not present

## 2017-10-06 DIAGNOSIS — H2512 Age-related nuclear cataract, left eye: Secondary | ICD-10-CM | POA: Diagnosis not present

## 2017-10-12 ENCOUNTER — Other Ambulatory Visit: Payer: Self-pay | Admitting: Neurology

## 2017-10-20 ENCOUNTER — Ambulatory Visit (INDEPENDENT_AMBULATORY_CARE_PROVIDER_SITE_OTHER): Payer: Medicare Other | Admitting: Neurology

## 2017-10-20 ENCOUNTER — Encounter: Payer: Self-pay | Admitting: Neurology

## 2017-10-20 ENCOUNTER — Encounter (INDEPENDENT_AMBULATORY_CARE_PROVIDER_SITE_OTHER): Payer: Self-pay

## 2017-10-20 VITALS — BP 108/62 | HR 62 | Ht 74.0 in | Wt 201.0 lb

## 2017-10-20 DIAGNOSIS — G301 Alzheimer's disease with late onset: Secondary | ICD-10-CM

## 2017-10-20 DIAGNOSIS — F0280 Dementia in other diseases classified elsewhere without behavioral disturbance: Secondary | ICD-10-CM | POA: Diagnosis not present

## 2017-10-20 DIAGNOSIS — I25810 Atherosclerosis of coronary artery bypass graft(s) without angina pectoris: Secondary | ICD-10-CM | POA: Diagnosis not present

## 2017-10-20 MED ORDER — MEMANTINE HCL 10 MG PO TABS: 10.0000 mg | ORAL_TABLET | Freq: Two times a day (BID) | ORAL | 3 refills | Status: DC

## 2017-10-20 NOTE — Progress Notes (Signed)
[facility]   Provider:  Larey Seat, M D  Referring Provider: Josetta Huddle, MD Primary Care Physician:  Jacob Huddle, MD  Chief Complaint  Patient presents with  . Follow-up    pt is with wife, rm 79. pt states that everything is the same as last time when he was here.     HPI:  Jacob [name] is a 71 y.o. male , seen here as a referral  from Dr. Inda [name] for an evaluation of neurocognitive decline.   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 He did have a borderline low vitamin B12 level in May of this year, which has been addressed and supplemented by his primary care physician, Dr. Josetta [name]. The patient has a history of atrial fibrillation with an INR goal of 2-3, has no history of bleeding and has no history of dietary indiscretions with vitamin K noncompliance with medication. He was tested by Jacob [name] in early spring of this year, after having shown signs of memory loss with a slow progression over the last couple of years. 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 Over the past 2 years he has noted word finding difficulties to increase. He retired in 2015 and his wife had noticed and gave examples of him not being able to sometimes follow simple directions, sometimes forgetting parts of conversations from the previous day and misplacing or losing personal items. 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  His past medical history is extensive ; for 17 years his ascending aortic aneurysm has been followed, but recently an enlargement was noticed.  chronic atrial fibrillation, COPD, coronary artery disease with CABG 2 in 2010, history of factor V Leiden deficiency, gouty arthritis, hypercholesterolemia, hyperglycemia, hypertension. He had external beam proton radiation in 2006 prostatectomy 2004 reoccurrences in 2006 at 2013. He has no history of head injury, stroke like symptoms seizures or neurologic infections. I was quite surprised after today's introductory interview with the patient that he would have scored at the lowest percentile. He was apparently unable to complete some of the Tests and subsections of tests. I do wonder if this was #1 bad day and what I see today is a very good day, or if this is actually really a sign off his increasing agitation and indications considering the length of the testing.  09-16-2016 My diagnosis is that of a progressive memory loss disorder, which also is indicated by the MRI report, showing perisylvian and moderate mesial temporal atrophy. There is some D.O. service. This is not unilateral but bilaterally fairly symmetric and I suspect that I cannot attribute this to the proton deep therapy he received. My colleague did not make it comment about the shape of the cerebellopontine but should be influenced if dementia is induced by alcohol abuse. I would also like  to say that the EEG was a normal base trunk rhythm which very much surprised. I would like to treat Jacob [name] is an Alzheimer patient but there is no indication of a vascular dementia. Aricept increased to 10 mg daily. and Namenda titrationpack ( Dr Marcellus Scott ) .   10-22-2016 Jacob [name] a myeloid PET brain scan was performed on 10/07/2016 and was positive for  brain amyloid, presence of moderate neuritic plaques in the brain. A positive scan indicates the presence of Alzheimer's disease. The patient performed today very well on a Mini-Mental Status Examination was 22 out of 30 points. His troubles were in serial 7 calculation and short-term memory but he was oriented to month day and season in place, and he could follow multistep commands. He has not gotten lost, but prefers his wife to drive him. He does not drive at night, in rain and not on highway. She drives him for several years when a trip goes onto the high way.   Interval history from 04/14/2017. I have the pleasure of seeing Jacob [name] today for revisit. They had a good holiday season and recently hosted a law school reunion. They report that it was great fun. He has more problems finding words. 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 Since the patient could not tolerate Aricept he was only treated with Namenda. He seems to tolerate this very well.  Interval history from 20 October 2017, Jacob [name] here today, this is a routine revisit to follow the patient's memory and cognitive status.  The patient retired from [employer] of [employer], he continues to be treated with [employer], he is doing well with word fluency. Today's Mini-Mental status examination scored 15 out of 30 points which is a progression since last years test.  He denies any difficulties with ADL. He recovering from Cataract surgery- has  noticed vision improvement. He wants to drive, but his wife doesn't let him.   Review of Systems: Out of a complete 14 system review, the patient complains of only the following symptoms, and all other reviewed systems are negative. Forgetfulness.    MMSE - Mini Mental State Exam 10/20/2017 04/14/2017 10/22/2016 09/16/2016 08/11/2016  Orientation to time 0 2 3 0 4  Orientation to Place 3 4 4 4 2   Registration 3 3 3 3 3   Attention/  Calculation 1 0 2 1 2   Recall 1 2 1 1  0  Language- name 2 objects 1 2 2 2 2   Language- repeat 1 1 1 1 1   Language- follow 3 step command 2 3 3 2 3   Language- read & follow direction 1 1 1 1 1   Write a sentence 1 1 1 1 1   Copy design 1 0 1 1 0  Total score 15 19 22 17 19      Family History  Problem Relation Age of Onset  . Cancer Mother   . CAD Mother   . Heart attack Mother   . Heart attack Father   . Valvular heart disease Father   . Thyroid disease Father   . Heart failure Father     Past Medical History:  Diagnosis Date  . Adrenal adenoma    bilateral  . Ascending aortic aneurysm ([place])    monitored by dr dr Daneen Schick-  stable 4.4cm x 4.3cm per CT 06-01-2014  . At risk for sleep apnea    STOP-BANG= 6  SENT TO PCP 03-05-2015  . Borderline diabetes mellitus   . Chronic anticoagulation   . Chronic atrial fibrillation ([place])   . Chronic diastolic heart failure ([place])   . Chronic idiopathic constipation   . Coronary artery disease    cardiologist-  dr Daneen Schick--  s/p  CABG x2 2000/   last myoview 2012-- normal  . Diabetes mellitus without complication ([place])   . Factor 5 Leiden mutation, heterozygous ([place])   . Gross hematuria   . History of adenomatous polyp of colon    2009  . History of basal cell carcinoma excision    forehead  . History of DVT of lower extremity    x2 1980's  . History of gout   . History of prostate cancer oncologist --dr Rafael Bihari [facility]--  no recurrence since 2013   dx 2004--  s/p  radical retropubic prostatectomy/  recurrence 2006  and 2013 s/p radiation at [facility]  and completed Hormone tx  . Hx of radiation therapy    external beam proton radiation 2006  . Hypertension   . Left ureteral stone   . Multiple thyroid nodules    Benign bx done July 2014  . OA (osteoarthritis)   . Radiation proctitis   . Recovering alcoholic in remission ([place])   . S/P CABG x 2    09-17-1999  . Secondary pulmonary hypertension     Past  Surgical History:  Procedure Laterality Date  . CARDIAC CATHETERIZATION  09-03-1999  dr Daneen Schick   Abnormal myoview/  80% mLAD,  total occlusion pRCA , collaterals from pRCA segment and left CFX and LAD,  OM1 sig. lesion,  total occlusion very small OM2,  persived LVSF,  elevated end-diastolic pressure  . CARDIOVASCULAR STRESS TEST  03-04-2011   dr Daneen Schick   normal perfusion study/  attenuation artifact in inferior region of myocardium,  no ischemia or infarct/scar,  unable to gate wall motion due to irregular rhythm  . COLONOSCOPY WITH PROPOFOL [name] 12/27/2013   Procedure: COLONOSCOPY WITH PROPOFOL;  Surgeon: Garlan Fair, MD;  Location: [facility];  Service: Endoscopy;  Laterality: [name];  . CORONARY ARTERY BYPASS GRAFT  09-17-1999   dr gerhardt   x2 vessel--  LIMA to LAD and RCA  . CYSTOSCOPY WITH RETROGRADE PYELOGRAM, URETEROSCOPY AND STENT PLACEMENT Left 03/07/2015   Procedure: CYSTOSCOPY WITH RETROGRADE PYELOGRAM, URETEROSCOPY  WITH STONE EXTRACTION AND STENT PLACEMENT;  Surgeon: Rana Snare, MD;  Location: [facility];  Service: Urology;  Laterality: Left;  . EXCISIONAL BX OF MASS, LEFT DISTAL BRACHIUM  08-09-2002  . HEMORRHOID SURGERY  2000  . HOLMIUM LASER APPLICATION Left 3/81/8299   Procedure: HOLMIUM LASER APPLICATION;  Surgeon: Rana Snare, MD;  Location: [facility];  Service: Urology;  Laterality: Left;  . KNEE SURGERY    . LUMBAR DISKECOTMY AND FUSION  1972   L4 -- L5  . RADICAL RETROPUBIC PROSTATECTOMY  02-20-2003  . TONSILLECTOMY  as child  . TOTAL HIP ARTHROPLASTY Left 01/18/2013   Procedure: TOTAL HIP ARTHROPLASTY;  Surgeon: Garald Balding, MD;  Location: [place];  Service: Orthopedics;  Laterality: Left;  Left Total Hip Arthroplasty  . TRANSTHORACIC ECHOCARDIOGRAM  05-10-2013  dr Daneen Schick   mild LVH/  55-60%/  severe LAE/  mild to moderate MR and TR/  mild RAE/  moderate to severe RV elevated systolic pressure/  IVC demonstates  <50% collapse w/ respiration/  due to atrial fib. unable evaluate diastolic function  Current Outpatient Medications  Medication Sig Dispense Refill  . carvedilol (COREG) 3.125 MG tablet Take 1 tablet (3.125 mg total) by mouth 2 (two) times daily with a meal. 180 tablet 3  . Cholecalciferol (VITAMIN D3) 2000 units TABS Take 1 tablet by mouth daily.     . colchicine 0.6 MG tablet Take 0.6 mg by mouth daily.     . Cyanocobalamin (B-12 COMPLIANCE INJECTION IJ) Inject as directed every 30 (thirty) days.     Marland Kitchen dutasteride (AVODART) 0.5 MG capsule Take 0.5 mg by mouth daily.    Marland Kitchen escitalopram (LEXAPRO) 10 MG tablet Take 10 mg by mouth every evening.     . folic acid (FOLVITE) 1 MG tablet Take 1 mg by mouth daily.    . furosemide (LASIX) 40 MG tablet Take 40 mg by mouth daily.    . memantine (NAMENDA) 10 MG tablet take 1 tablet by mouth twice a day 180 tablet 3  . metformin (FORTAMET) 1000 MG (OSM) 24 hr tablet Take 1,000 mg by mouth 2 (two) times daily with a meal.     . ramipril (ALTACE) 2.5 MG capsule Take 2.5 mg by mouth daily.    Marland Kitchen warfarin (COUMADIN) 5 MG tablet Take 1-1.5 tablets (5-7.5 mg total) by mouth daily. Take 1 tablet daily except on Sundays take 1 1/2 tablet (7.5mg )     No current facility-administered medications for this visit.     Allergies as of 10/20/2017 - Review Complete 10/20/2017  Allergen Reaction Noted  . Percocet [oxycodone-acetaminophen] Other (See Comments) 01/13/2013  . Allopurinol Other (See Comments) 05/25/2014  . Ciprofloxacin  10/20/2017  . Crestor [rosuvastatin calcium]  06/12/2017  . Dilaudid [hydromorphone hcl] Other (See Comments) 12/08/2013    Vitals: BP 108/62   Pulse 62   Ht 6\' 2"  (1.88 m)   Wt 201 lb (91.2 kg)   BMI 25.81 kg/m  Last Weight:  Wt Readings from Last 1 Encounters:  10/20/17 201 lb (91.2 kg)   LKG:MWNU mass index is 25.81 kg/m.     Last Height:   Ht Readings from Last 1 Encounters:  10/20/17 6\' 2"  (1.88 m)    Physical  exam:  General: The patient is awake, alert and appears not in acute distress. The patient is well groomed. Head: Normocephalic, atraumatic. Neck is supple. Mallampati 3  neck circumference:17,  Nasal airflow patent , TMJ click evident . Retrognathia is not seen.  Severe neck stiffness.  Cardiovascular:  Regular rate and rhythm, without  murmurs or carotid bruit, and without distended neck veins. Respiratory: Lungs are clear to auscultation. Skin:  Without evidence of edema, or rash Trunk:  The patient's posture is erect.   Neurologic exam :The patient is awake and alert, oriented to place and time.  Memory subjective  described as impaired;  He does best in the morning. He still recalls names.   MMSE - Mini Mental State Exam 10/20/2017 04/14/2017 10/22/2016  Orientation to time 0 2 3  Orientation to Place 3 4 4   Registration 3 3 3   Attention/ Calculation 1 0 2  Recall 1 2 1   Language- name 2 objects 1 2 2   Language- repeat 1 1 1   Language- follow 3 step command 2 3 3   Language- read & follow direction 1 1 1   Write a sentence 1 1 1   Copy design 1 0 1  Total score 15 19 22    CLINICAL DATA:  Declining memory function. Evaluate for Alzheimer's type dementia.  EXAM: NM PET  METABOLIC BRAIN  TECHNIQUE: 10.9 mCi F-18 Florbetapir was injected intravenously. Full-ring PET imaging was performed from the vertex to the skull base. CT data was obtained and used for attenuation correction and anatomic localization.  COMPARISON:  Brain MRI 08/26/2016  Findings: There is increased florbetapir uptake seen in the cortical cerebral gray matter of the temporal, frontal, and parietal lobes, with these regions showing loss of the normal gray-white contrast. There is prominent demonstration of loss of gray-white differentiation in the paramedian parietal lobes. Additional prominent loss of gray-white differentiation in the temporal lobes and RIGHT frontal lobe. Areas of Preserved white  matter and gray matter differentiation in the occipital lobes. The cerebellum has no evidence of abnormal uptake with normal gray-white differentiation.  CT (for attenuation): Moderate atrophy.  No acute findings  FINDINGS: IMPRESSION:  The scan is POSITIVE for brain amyloid and is most consistent with the presence of moderate to frequent neuritic beta-amyloid plaques in the brain.  NOTE: Florbetapir is a radiopharmaceutical indicated for Positron Emission Tomography (PET) imaging of beta-amyloid neuritic plaques in the brains of adult patients with cognitive impairment being evaluated for suspected Alzheimer's disease (AD). A positive scan indicates moderate to frequent plaques, which demonstrates the presence of AD pathology. A negative scan indicates sparse or no plaques, which is inconsistent with a diagnosis of AD. Florbetapir in the is an adjunct to other diagnostic evaluations.   Electronically Signed   By: Suzy Bouchard M.D.   On: 10/07/2016 16:36      Attention span & concentration ability appears normal in casual conversation, he can even change to the [place] language .  Speech is fluent,  no  Dysarthria, but  hoarse-dysphonia and some aphasia. Some stuttering.  Mood and affect are appropriate.  Cranial nerves: taste and smell reported intact.  Pupils are equal and briskly reactive to light. Extraocular movements  intact . Visual fields by finger perimetry are intact. Hearing to finger rub intact.  Facial sensation intact to fine touch. Facial motor strength is symmetric and tongue and uvula move midline. Shoulder shrug was symmetrical.  Motor exam: Normal tone, muscle bulk and symmetric strength in all extremities. Sensory:  Fine touch, pinprick and vibration were tested in all extremities. Proprioception tested in the upper extremities was normal. Coordination: Rapid alternating movements in the fingers/hands was normal. Finger-to-nose maneuver  normal  without evidence of ataxia, dysmetria or tremor. Gait and station: Patient walks without assistive device and is able unassisted to climb up to the exam table. Strength within normal limits. Stance is stable and normal. Tandem gait remains  fragmented.  His Gait is still unsteady and ataxic. Turns either way with 4 Steps. Romberg testing is negative. Status post left hip replacement  2014 .  Deep tendon reflexes: in the  upper and lower extremities are symmetric - Babinski maneuver response is downgoing.  The patient was advised of the nature of the diagnosed dementia disorder , the treatment options and risks for general a health and wellness arising from not treating the condition.  I spent more than 25  minutes of face to face time with the patient and his wife. Greater than 50% of time was spent in counseling and coordination of care. We have discussed the diagnosis and differential and I answered the patient's questions.    Assessment:  After physical and neurologic examination, review of laboratory studies,  Personal review of imaging studies, reports of other /same  Imaging studies ,  Results of polysomnography/ neurophysiology testing and pre-existing  records as far as provided in visit., my assessment is   1)  Moderate to severe memory decline. 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 Slowed EEG.  2)   status post brain radiation for cancer. 3)  Maintains MMSE between 19 and 22. MOCA not performed.    Plan:  Treatment plan and additional workup :   Bradycardia exacerbated - cannot take Aricept!!!! Namenda dose  10 mg bid. from today on.   Stay physically active, get enough sleep, plan cognitive challenging activities for the morning and physical activities for the afternoons.  Moderate well-balanced diet. [employer] 3 times weekly, stay physically active.  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  Rv in 6 month.   Asencion Partridge Ashden Sonnenberg  MD  10/20/2017   CC: Jacob Huddle, Md [address] [place], [place] [address]

## 2017-10-20 NOTE — Patient Instructions (Signed)
Memantine Tablets  What is this medicine?  MEMANTINE (MEM an teen) is used to treat dementia caused by Alzheimer's disease.  This medicine may be used for other purposes; ask your health care provider or pharmacist if you have questions.  COMMON BRAND NAME(S): Namenda  What should I tell my health care provider before I take this medicine?  They need to know if you have any of these conditions:  -difficulty passing urine  -kidney disease  -liver disease  -seizures  -an unusual or allergic reaction to memantine, other medicines, foods, dyes, or preservatives  -pregnant or trying to get pregnant  -breast-feeding  How should I use this medicine?  Take this medicine by mouth with a glass of water. Follow the directions on the prescription label. You may take this medicine with or without food. Take your doses at regular intervals. Do not take your medicine more often than directed. Continue to take your medicine even if you feel better. Do not stop taking except on the advice of your doctor or health care professional.  Talk to your pediatrician regarding the use of this medicine in children. Special care may be needed.  Overdosage: If you think you have taken too much of this medicine contact a poison control center or emergency room at once.  NOTE: This medicine is only for you. Do not share this medicine with others.  What if I miss a dose?  If you miss a dose, take it as soon as you can. If it is almost time for your next dose, take only that dose. Do not take double or extra doses. If you do not take your medicine for several days, contact your health care provider. Your dose may need to be changed.  What may interact with this medicine?  -acetazolamide  -amantadine  -cimetidine  -dextromethorphan  -dofetilide  -hydrochlorothiazide  -ketamine  -metformin  -methazolamide  -quinidine  -ranitidine  -sodium bicarbonate  -triamterene  This list may not describe all possible interactions. Give your health care provider a  list of all the medicines, herbs, non-prescription drugs, or dietary supplements you use. Also tell them if you smoke, drink alcohol, or use illegal drugs. Some items may interact with your medicine.  What should I watch for while using this medicine?  Visit your doctor or health care professional for regular checks on your progress. Check with your doctor or health care professional if there is no improvement in your symptoms or if they get worse.  You may get drowsy or dizzy. Do not drive, use machinery, or do anything that needs mental alertness until you know how this drug affects you. Do not stand or sit up quickly, especially if you are an older patient. This reduces the risk of dizzy or fainting spells. Alcohol can make you more drowsy and dizzy. Avoid alcoholic drinks.  What side effects may I notice from receiving this medicine?  Side effects that you should report to your doctor or health care professional as soon as possible:  -allergic reactions like skin rash, itching or hives, swelling of the face, lips, or tongue  -agitation or a feeling of restlessness  -depressed mood  -dizziness  -hallucinations  -redness, blistering, peeling or loosening of the skin, including inside the mouth  -seizures  -vomiting  Side effects that usually do not require medical attention (report to your doctor or health care professional if they continue or are bothersome):  -constipation  -diarrhea  -headache  -nausea  -trouble sleeping  This   F). Throw away any unused medicine after the expiration date. NOTE: This sheet is a summary. It may not cover all possible information. If you have questions about this  medicine, talk to your doctor, pharmacist, or health care provider.  2018 Elsevier/Gold Standard (2013-08-29 14:10:42)

## 2017-10-23 DIAGNOSIS — Z7901 Long term (current) use of anticoagulants: Secondary | ICD-10-CM | POA: Diagnosis not present

## 2017-10-30 DIAGNOSIS — E538 Deficiency of other specified B group vitamins: Secondary | ICD-10-CM | POA: Diagnosis not present

## 2017-11-09 DIAGNOSIS — L821 Other seborrheic keratosis: Secondary | ICD-10-CM | POA: Diagnosis not present

## 2017-11-09 DIAGNOSIS — D1801 Hemangioma of skin and subcutaneous tissue: Secondary | ICD-10-CM | POA: Diagnosis not present

## 2017-11-09 DIAGNOSIS — L570 Actinic keratosis: Secondary | ICD-10-CM | POA: Diagnosis not present

## 2017-11-09 DIAGNOSIS — Z85828 Personal history of other malignant neoplasm of skin: Secondary | ICD-10-CM | POA: Diagnosis not present

## 2017-11-09 DIAGNOSIS — L853 Xerosis cutis: Secondary | ICD-10-CM | POA: Diagnosis not present

## 2017-11-09 DIAGNOSIS — D225 Melanocytic nevi of trunk: Secondary | ICD-10-CM | POA: Diagnosis not present

## 2017-11-23 DIAGNOSIS — Z7901 Long term (current) use of anticoagulants: Secondary | ICD-10-CM | POA: Diagnosis not present

## 2017-12-01 DIAGNOSIS — E538 Deficiency of other specified B group vitamins: Secondary | ICD-10-CM | POA: Diagnosis not present

## 2017-12-14 ENCOUNTER — Other Ambulatory Visit: Payer: Self-pay | Admitting: Podiatry

## 2017-12-14 ENCOUNTER — Ambulatory Visit (INDEPENDENT_AMBULATORY_CARE_PROVIDER_SITE_OTHER): Payer: Medicare Other

## 2017-12-14 ENCOUNTER — Ambulatory Visit (INDEPENDENT_AMBULATORY_CARE_PROVIDER_SITE_OTHER): Payer: Medicare Other | Admitting: Podiatry

## 2017-12-14 ENCOUNTER — Encounter: Payer: Self-pay | Admitting: Podiatry

## 2017-12-14 DIAGNOSIS — M79671 Pain in right foot: Secondary | ICD-10-CM

## 2017-12-14 DIAGNOSIS — M79676 Pain in unspecified toe(s): Secondary | ICD-10-CM

## 2017-12-14 DIAGNOSIS — B351 Tinea unguium: Secondary | ICD-10-CM

## 2017-12-14 DIAGNOSIS — M7751 Other enthesopathy of right foot: Secondary | ICD-10-CM

## 2017-12-14 DIAGNOSIS — M779 Enthesopathy, unspecified: Secondary | ICD-10-CM

## 2017-12-14 DIAGNOSIS — M79672 Pain in left foot: Secondary | ICD-10-CM

## 2017-12-14 DIAGNOSIS — M778 Other enthesopathies, not elsewhere classified: Secondary | ICD-10-CM

## 2017-12-16 NOTE — Progress Notes (Signed)
Subjective:   Patient ID: Jacob [name], male   DOB: 72 y.o.   MRN: [id number]   HPI Patient presents stating concerned about his nails and also he knows he needs new orthotics   ROS      Objective:  Physical Exam  Neurovascular status intact muscle strength was adequate with patient found to have inflammation of the plantar aspect both feet with moderate depression of the arch and nail disease with thickness     Assessment:  Tendinitis with mycotic nail infection     Plan:  H&P condition reviewed and recommended orthotics and scan for customized orthotics to reduce plantar pressure.  Reappoint when returned

## 2017-12-21 DIAGNOSIS — Z7901 Long term (current) use of anticoagulants: Secondary | ICD-10-CM | POA: Diagnosis not present

## 2018-01-04 DIAGNOSIS — I10 Essential (primary) hypertension: Secondary | ICD-10-CM | POA: Diagnosis not present

## 2018-01-04 DIAGNOSIS — R160 Hepatomegaly, not elsewhere classified: Secondary | ICD-10-CM | POA: Diagnosis not present

## 2018-01-04 DIAGNOSIS — G309 Alzheimer's disease, unspecified: Secondary | ICD-10-CM | POA: Diagnosis not present

## 2018-01-04 DIAGNOSIS — E538 Deficiency of other specified B group vitamins: Secondary | ICD-10-CM | POA: Diagnosis not present

## 2018-01-04 DIAGNOSIS — I482 Chronic atrial fibrillation: Secondary | ICD-10-CM | POA: Diagnosis not present

## 2018-01-04 DIAGNOSIS — R634 Abnormal weight loss: Secondary | ICD-10-CM | POA: Diagnosis not present

## 2018-01-04 DIAGNOSIS — I2510 Atherosclerotic heart disease of native coronary artery without angina pectoris: Secondary | ICD-10-CM | POA: Diagnosis not present

## 2018-01-04 DIAGNOSIS — C61 Malignant neoplasm of prostate: Secondary | ICD-10-CM | POA: Diagnosis not present

## 2018-01-04 DIAGNOSIS — Z79899 Other long term (current) drug therapy: Secondary | ICD-10-CM | POA: Diagnosis not present

## 2018-01-11 ENCOUNTER — Other Ambulatory Visit: Payer: Medicare Other | Admitting: Orthotics

## 2018-01-18 ENCOUNTER — Ambulatory Visit: Payer: Medicare Other | Admitting: Orthotics

## 2018-01-18 DIAGNOSIS — B351 Tinea unguium: Secondary | ICD-10-CM

## 2018-01-18 DIAGNOSIS — M779 Enthesopathy, unspecified: Principal | ICD-10-CM

## 2018-01-18 DIAGNOSIS — Z8546 Personal history of malignant neoplasm of prostate: Secondary | ICD-10-CM | POA: Diagnosis not present

## 2018-01-18 DIAGNOSIS — M778 Other enthesopathies, not elsewhere classified: Secondary | ICD-10-CM

## 2018-01-18 DIAGNOSIS — E538 Deficiency of other specified B group vitamins: Secondary | ICD-10-CM | POA: Diagnosis not present

## 2018-01-18 DIAGNOSIS — L03032 Cellulitis of left toe: Secondary | ICD-10-CM

## 2018-01-18 DIAGNOSIS — Z7901 Long term (current) use of anticoagulants: Secondary | ICD-10-CM | POA: Diagnosis not present

## 2018-01-18 DIAGNOSIS — R634 Abnormal weight loss: Secondary | ICD-10-CM | POA: Diagnosis not present

## 2018-01-18 DIAGNOSIS — L02612 Cutaneous abscess of left foot: Secondary | ICD-10-CM

## 2018-01-18 NOTE — Progress Notes (Signed)
Patient came in today to pick up custom made foot orthotics.  The goals were accomplished and the patient reported no dissatisfaction with said orthotics.  Patient was advised of breakin period and how to report any issues. 

## 2018-02-01 DIAGNOSIS — D171 Benign lipomatous neoplasm of skin and subcutaneous tissue of trunk: Secondary | ICD-10-CM | POA: Diagnosis not present

## 2018-02-01 DIAGNOSIS — Z85828 Personal history of other malignant neoplasm of skin: Secondary | ICD-10-CM | POA: Diagnosis not present

## 2018-02-01 DIAGNOSIS — L821 Other seborrheic keratosis: Secondary | ICD-10-CM | POA: Diagnosis not present

## 2018-02-01 DIAGNOSIS — L723 Sebaceous cyst: Secondary | ICD-10-CM | POA: Diagnosis not present

## 2018-02-15 DIAGNOSIS — Z7901 Long term (current) use of anticoagulants: Secondary | ICD-10-CM | POA: Diagnosis not present

## 2018-03-15 DIAGNOSIS — Z7901 Long term (current) use of anticoagulants: Secondary | ICD-10-CM | POA: Diagnosis not present

## 2018-03-15 DIAGNOSIS — E538 Deficiency of other specified B group vitamins: Secondary | ICD-10-CM | POA: Diagnosis not present

## 2018-03-29 ENCOUNTER — Other Ambulatory Visit: Payer: Self-pay | Admitting: Internal Medicine

## 2018-03-29 DIAGNOSIS — R6881 Early satiety: Secondary | ICD-10-CM

## 2018-03-29 DIAGNOSIS — R1033 Periumbilical pain: Secondary | ICD-10-CM | POA: Diagnosis not present

## 2018-03-31 ENCOUNTER — Other Ambulatory Visit: Payer: Medicare Other

## 2018-04-01 ENCOUNTER — Ambulatory Visit
Admission: RE | Admit: 2018-04-01 | Discharge: 2018-04-01 | Disposition: A | Discharge status: Home / Self Care | Payer: Medicare Other | Source: Ambulatory Visit | Attending: Internal Medicine | Admitting: Internal Medicine

## 2018-04-01 ENCOUNTER — Other Ambulatory Visit: Payer: Self-pay | Admitting: Internal Medicine

## 2018-04-01 DIAGNOSIS — R140 Abdominal distension (gaseous): Secondary | ICD-10-CM | POA: Diagnosis not present

## 2018-04-01 DIAGNOSIS — R6881 Early satiety: Secondary | ICD-10-CM

## 2018-04-05 DIAGNOSIS — R1032 Left lower quadrant pain: Secondary | ICD-10-CM | POA: Diagnosis not present

## 2018-04-05 DIAGNOSIS — R6881 Early satiety: Secondary | ICD-10-CM | POA: Diagnosis not present

## 2018-04-20 ENCOUNTER — Encounter: Payer: Self-pay | Admitting: Neurology

## 2018-04-20 ENCOUNTER — Ambulatory Visit (INDEPENDENT_AMBULATORY_CARE_PROVIDER_SITE_OTHER): Payer: Medicare Other | Admitting: Neurology

## 2018-04-20 ENCOUNTER — Telehealth: Payer: Self-pay | Admitting: *Deleted

## 2018-04-20 VITALS — BP 120/76 | HR 68 | Ht 74.0 in | Wt 188.0 lb

## 2018-04-20 DIAGNOSIS — R634 Abnormal weight loss: Secondary | ICD-10-CM | POA: Diagnosis not present

## 2018-04-20 DIAGNOSIS — F0280 Dementia in other diseases classified elsewhere without behavioral disturbance: Secondary | ICD-10-CM | POA: Diagnosis not present

## 2018-04-20 DIAGNOSIS — G301 Alzheimer's disease with late onset: Secondary | ICD-10-CM | POA: Diagnosis not present

## 2018-04-20 DIAGNOSIS — E538 Deficiency of other specified B group vitamins: Secondary | ICD-10-CM | POA: Diagnosis not present

## 2018-04-20 DIAGNOSIS — Z7901 Long term (current) use of anticoagulants: Secondary | ICD-10-CM | POA: Diagnosis not present

## 2018-04-20 NOTE — Telephone Encounter (Signed)
Office visit scheduled with Tye Savoy, NP for 04/22/18 at 3:00 pm. Patient's wife has been advised and verbalizes understanding.

## 2018-04-20 NOTE — Telephone Encounter (Signed)
-----   Message from Jerene Bears, MD sent at 04/20/2018  4:37 PM EDT ----- Pt needs app visit for unintentional weight loss JMP  ----- Message ----- From: Larey Seat, MD Sent: 04/20/2018   2:21 PM To: Grace Isaac, MD, Jerene Bears, MD

## 2018-04-20 NOTE — Progress Notes (Signed)
[facility]   Provider:  Larey Seat, M D  Referring Provider: Josetta Huddle, MD Primary Care Physician:  Josetta Huddle, MD  Chief Complaint  Patient presents with  . Follow-up    pt with wife , rm 82. pt states he doesnt have any concerns, wife states that he has lost weight. pt's wife states decrease in appetite, he has 13 lbs since last OV. wife states there has been some decrease in memory but not drastic,     HPI:  Jacob [name] is a 72 y.o. male , seen here as a referral  from [name] for an evaluation of neurocognitive decline.   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 He did have a borderline low vitamin B12 level in May of this year, which has been addressed and supplemented by his primary care physician, Dr. Josetta Huddle. The patient has a history of atrial fibrillation with an INR goal of 2-3, has no history of bleeding and has no history of dietary indiscretions with vitamin K noncompliance with medication. He was tested by [name] in early spring of this year, after having shown signs of memory loss with a slow progression over the last couple of years. I do not have the original results from [name] and available here, but the patient indicated that he became increasingly fatigued and somewhat irritated at the length of the testing.The patient saw 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 Over the past 2 years he has noted word finding difficulties to increase. He retired in 2015 and his wife had noticed and gave examples of him not being able to sometimes follow simple directions, sometimes forgetting parts of conversations from the previous day and misplacing or losing  personal items. 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  His past medical history is extensive ; for 17 years his ascending aortic aneurysm has been followed, but recently an enlargement was noticed.  chronic atrial fibrillation, COPD, coronary artery disease with CABG 2 in 2010, history of factor V Leiden deficiency, gouty arthritis, hypercholesterolemia, hyperglycemia, hypertension. He had external beam proton radiation in 2006 prostatectomy 2004 reoccurrences in 2006 at 2013. He has no history of head injury, stroke like symptoms seizures or neurologic infections. I was quite surprised after today's introductory interview with the patient that he would have scored at the lowest percentile. He was apparently unable to complete some of the Tests and subsections of tests. I do wonder if this was #1 bad day and what I see today is a very good day, or if this is actually really a sign off his increasing agitation and indications considering the length of the testing.  09-16-2016 My diagnosis is that of a progressive memory loss disorder, which also is indicated by the MRI report, showing perisylvian and moderate mesial temporal atrophy. There is some D.O. service. This is not unilateral but bilaterally fairly symmetric and I suspect that I cannot attribute this to the proton deep therapy he received. My colleague did  not make it comment about the shape of the cerebellopontine but should be influenced if dementia is induced by alcohol abuse. I would also like to say that the EEG was a normal base trunk rhythm which very much surprised. I would like to treat Jacob [name] is an Alzheimer patient but there is no indication of a vascular dementia. Aricept increased to 10 mg daily. and Namenda  titrationpack ( Dr Marcellus Scott ) .   10-22-2016 Jacob [name] a myeloid PET brain scan was performed on 10/07/2016 and was positive for brain amyloid, presence of moderate neuritic plaques in the brain. A positive scan indicates the presence of Alzheimer's disease. The patient performed today very well on a Mini-Mental Status Examination was 22 out of 30 points. His troubles were in serial 7 calculation and short-term memory but he was oriented to month day and season in place, and he could follow multistep commands. He has not gotten lost, but prefers his wife to drive him. He does not drive at night, in rain and not on highway. She drives him for several years when a trip goes onto the high way.   Interval history from 04/14/2017. I have the pleasure of seeing Mr. Mrs. [name] today for revisit. They had a good holiday season and recently hosted a law school reunion. They report that it was great fun. He has more problems finding words. 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 Since the patient could not tolerate Aricept he was only treated with Namenda. He seems to tolerate this very well.  Interval history from 20 October 2017, Jacob [name] here today, this is a routine revisit to follow the patient's memory and cognitive status.  The patient retired from [employer], he continues to be treated with [employer], he is doing well with word fluency. Today's Mini-Mental status examination scored 15 out of 30 points which is a progression since last years test.  He denies any difficulties with ADL. He recovering from Cataract surgery- has  noticed vision improvement. He wants to drive, but his wife doesn't let him.    04-20-2018,   Wife reports chronic constipation- but no change in appetite,not forgetting any meals- but involuntary Weight loss, about 35 pounds. He looks rather sickly today, not as groomed, an he ost muscle mass. He gave up  all driving. Progressive dementia, not depressed, neither infected . A upper Gi test was normal.  He had a similar weight loss of 25 pounds after a hip surgery, unknown reason- his doctors at [facility] believed this was related to chemo and radiation," hormone therapy- stress on his body ". His MMSE was only 13-30 points, lowest score.   I am worried that he may have a pancreatic illness. Send notes to  Dr Hilarie [name].     Review of Systems: Out of a complete 14 system review, the patient complains of only the following symptoms, and all other reviewed systems are negative. Forgetfulness.    MMSE - Mini Mental State Exam 04/20/2018 10/20/2017 04/14/2017 10/22/2016 09/16/2016 08/11/2016  Orientation to time 1 0 2 3 0 4  Orientation to Place 2 3 4 4 4 2   Registration 3 3 3 3 3 3   Attention/ Calculation 1 1 0 2 1 2   Attention/Calculation-comments no numbers, only D in the word backwards - - - - -  Recall 1 1 2 1 1  0  Language- name 2 objects 0 1  2 2 2 2   Language- repeat 1 1 1 1 1 1   Language- follow 3 step command 3 2 3 3 2 3   Language- read & follow direction 0 1 1 1 1 1   Write a sentence 0 1 1 1 1 1   Copy design 1 1 0 1 1 0  Total score 13 15 19 22 17 19      Family History  Problem Relation Age of Onset  . Cancer Mother   . CAD Mother   . Heart attack Mother   . Heart attack Father   . Valvular heart disease Father   . Thyroid disease Father   . Heart failure Father     Past Medical History:  Diagnosis Date  . Adrenal adenoma    bilateral  . Ascending aortic aneurysm ([place])    monitored by dr dr Daneen Schick-  stable 4.4cm x 4.3cm per CT 06-01-2014  . At risk for sleep apnea    STOP-BANG= 6     SENT TO PCP 03-05-2015  . Borderline diabetes mellitus   . Chronic anticoagulation   . Chronic atrial fibrillation ([place])   . Chronic diastolic heart failure ([place])   . Chronic idiopathic constipation   . Coronary artery disease    cardiologist-  dr Daneen Schick--  s/p  CABG x2 2000/    last myoview 2012-- normal  . Diabetes mellitus without complication ([place])   . Factor 5 Leiden mutation, heterozygous ([place])   . Gross hematuria   . History of adenomatous polyp of colon    2009  . History of basal cell carcinoma excision    forehead  . History of DVT of lower extremity    x2 1980's  . History of gout   . History of prostate cancer oncologist --dr Rafael Bihari [facility]--  no recurrence since 2013   dx 2004--  s/p  radical retropubic prostatectomy/  recurrence 2006  and 2013 s/p radiation at [facility]  and completed Hormone tx  . Hx of radiation therapy    external beam proton radiation 2006  . Hypertension   . Left ureteral stone   . Multiple thyroid nodules    Benign bx done July 2014  . OA (osteoarthritis)   . Radiation proctitis   . Recovering alcoholic in remission ([place])   . S/P CABG x 2    09-17-1999  . Secondary pulmonary hypertension     Past Surgical History:  Procedure Laterality Date  . CARDIAC CATHETERIZATION  09-03-1999  dr Daneen Schick   Abnormal myoview/  80% mLAD,  total occlusion pRCA , collaterals from pRCA segment and left CFX and LAD,  OM1 sig. lesion,  total occlusion very small OM2,  persived LVSF,  elevated end-diastolic pressure  . CARDIOVASCULAR STRESS TEST  03-04-2011   dr Daneen Schick   normal perfusion study/  attenuation artifact in inferior region of myocardium,  no ischemia or infarct/scar,  unable to gate wall motion due to irregular rhythm  . COLONOSCOPY WITH PROPOFOL [name] 12/27/2013   Procedure: COLONOSCOPY WITH PROPOFOL;  Surgeon: Garlan Fair, MD;  Location: [facility];  Service: Endoscopy;  Laterality: [name];  . CORONARY ARTERY BYPASS GRAFT  09-17-1999   dr gerhardt   x2 vessel--  LIMA to LAD and RCA  . CYSTOSCOPY WITH RETROGRADE PYELOGRAM, URETEROSCOPY AND STENT PLACEMENT Left 03/07/2015   Procedure: CYSTOSCOPY WITH RETROGRADE PYELOGRAM, URETEROSCOPY  WITH STONE EXTRACTION AND STENT PLACEMENT;  Surgeon: Rana Snare, MD;   Location: [facility];  Service: Urology;  Laterality: Left;  . EXCISIONAL BX OF MASS, LEFT DISTAL BRACHIUM  08-09-2002  . HEMORRHOID SURGERY  2000  . HOLMIUM LASER APPLICATION Left 02/20/5187   Procedure: HOLMIUM LASER APPLICATION;  Surgeon: Rana Snare, MD;  Location: [facility];  Service: Urology;  Laterality: Left;  . KNEE SURGERY    . LUMBAR DISKECOTMY AND FUSION  1972   L4 -- L5  . RADICAL RETROPUBIC PROSTATECTOMY  02-20-2003  . TONSILLECTOMY  as child  . TOTAL HIP ARTHROPLASTY Left 01/18/2013   Procedure: TOTAL HIP ARTHROPLASTY;  Surgeon: Garald Balding, MD;  Location: [facility];  Service: Orthopedics;  Laterality: Left;  Left Total Hip Arthroplasty  . TRANSTHORACIC ECHOCARDIOGRAM  05-10-2013  dr Daneen Schick   mild LVH/  55-60%/  severe LAE/  mild to moderate MR and TR/  mild RAE/  moderate to severe RV elevated systolic pressure/  IVC demonstates <50% collapse w/ respiration/  due to atrial fib. unable evaluate diastolic function    Current Outpatient Medications  Medication Sig Dispense Refill  . carvedilol (COREG) 3.125 MG tablet Take 1 tablet (3.125 mg total) by mouth 2 (two) times daily with a meal. 180 tablet 3  . Cholecalciferol (VITAMIN D3) 2000 units TABS Take 1 tablet by mouth daily.     . colchicine 0.6 MG tablet Take 0.6 mg by mouth daily.     . Cyanocobalamin (B-12 COMPLIANCE INJECTION IJ) Inject as directed every 30 (thirty) days.     Marland Kitchen dutasteride (AVODART) 0.5 MG capsule Take 0.5 mg by mouth daily.    Marland Kitchen escitalopram (LEXAPRO) 10 MG tablet Take 10 mg by mouth every evening.     . folic acid (FOLVITE) 1 MG tablet Take 1 mg by mouth daily.    . furosemide (LASIX) 40 MG tablet Take 40 mg by mouth daily.    . memantine (NAMENDA) 10 MG tablet Take 1 tablet (10 mg total) by mouth 2 (two) times daily. 180 tablet 3  . metformin (FORTAMET) 1000 MG (OSM) 24 hr tablet Take 1,000 mg by mouth 2 (two) times daily with a meal.     . ramipril (ALTACE)  2.5 MG capsule Take 2.5 mg by mouth daily.    Marland Kitchen warfarin (COUMADIN) 5 MG tablet Take 1-1.5 tablets (5-7.5 mg total) by mouth daily. Take 1 tablet daily except on Sundays take 1 1/2 tablet (7.5mg )     No current facility-administered medications for this visit.     Allergies as of 04/20/2018 - Review Complete 04/20/2018  Allergen Reaction Noted  . Percocet [oxycodone-acetaminophen] Other (See Comments) 01/13/2013  . Allopurinol Other (See Comments) 05/25/2014  . Ciprofloxacin  10/20/2017  . Crestor [rosuvastatin calcium]  06/12/2017  . Dilaudid [hydromorphone hcl] Other (See Comments) 12/08/2013    Vitals: BP 120/76   Pulse 68   Ht 6\' 2"  (1.88 m)   Wt 188 lb (85.3 kg)   BMI 24.14 kg/m  Last Weight:  Wt Readings from Last 1 Encounters:  04/20/18 188 lb (85.3 kg)   CZY:SAYT mass index is 24.14 kg/m.     Last Height:   Ht Readings from Last 1 Encounters:  04/20/18 6\' 2"  (1.88 m)    Physical exam:  General: The patient is awake, alert and appears not in acute distress. The patient is well groomed. Head: Normocephalic, atraumatic. Neck is supple. Mallampati 3  neck circumference:17,  Nasal airflow patent , TMJ click evident . Retrognathia is not seen.  Severe neck stiffness.  Cardiovascular:  Regular rate and rhythm, without  murmurs or carotid bruit, and without distended neck veins. Respiratory: Lungs are clear to auscultation. Skin:  Without evidence of edema, or rash Trunk:  The patient's posture is erect.   Neurologic exam :The patient is awake and alert, oriented to place and time.  Memory subjective  described as impaired;  He does best in the morning. He still recalls names.   MMSE - Mini Mental State Exam 04/20/2018 10/20/2017 04/14/2017  Orientation to time 1 0 2  Orientation to Place 2 3 4   Registration 3 3 3   Attention/ Calculation 1 1 0  Attention/Calculation-comments no numbers, only D in the word backwards - -  Recall 1 1 2   Language- name 2 objects 0 1 2    Language- repeat 1 1 1   Language- follow 3 step command 3 2 3   Language- read & follow direction 0 1 1  Write a sentence 0 1 1  Copy design 1 1 0  Total score 13 15 19    CLINICAL DATA:  Declining memory function. Evaluate for Alzheimer's type dementia.  EXAM: NM PET METABOLIC BRAIN  TECHNIQUE: 10.9 mCi F-18 Florbetapir was injected intravenously. Full-ring PET imaging was performed from the vertex to the skull base. CT data was obtained and used for attenuation correction and anatomic localization.  COMPARISON:  Brain MRI 08/26/2016  Findings: There is increased florbetapir uptake seen in the cortical cerebral gray matter of the temporal, frontal, and parietal lobes, with these regions showing loss of the normal gray-white contrast. There is prominent demonstration of loss of gray-white differentiation in the paramedian parietal lobes. Additional prominent loss of gray-white differentiation in the temporal lobes and RIGHT frontal lobe. Areas of Preserved white matter and gray matter differentiation in the occipital lobes. The cerebellum has no evidence of abnormal uptake with normal gray-white differentiation.  CT (for attenuation): Moderate atrophy.  No acute findings  FINDINGS: IMPRESSION:  The scan is POSITIVE for brain amyloid and is most consistent with the presence of moderate to frequent neuritic beta-amyloid plaques in the brain.  NOTE: Florbetapir is a radiopharmaceutical indicated for Positron Emission Tomography (PET) imaging of beta-amyloid neuritic plaques in the brains of adult patients with cognitive impairment being evaluated for suspected Alzheimer's disease (AD). A positive scan indicates moderate to frequent plaques, which demonstrates the presence of AD pathology. A negative scan indicates sparse or no plaques, which is inconsistent with a diagnosis of AD. Florbetapir in the is an adjunct to other diagnostic  evaluations.   Electronically Signed   By: Suzy Bouchard M.D.   On: 10/07/2016 16:36      Attention span & concentration ability appears normal in casual conversation, he can even change to the [place] language .  Speech is fluent, no Dysarthria, but  hoarse-dysphonia and some aphasia. Some stuttering.  Mood and affect are appropriate.  Cranial nerves: taste and smell reportedly intact.  Pupils are equal and briskly reactive to light. Extraocular movements  intact . Visual fields by finger perimetry are intact. Hearing to finger rub intact.  Facial sensation intact to fine touch. Facial motor strength is symmetric and tongue and uvula move midline. Shoulder shrug was symmetrical.  Motor exam: Normal tone, muscle bulk and symmetric strength in all extremities. Sensory:  Fine touch, pinprick and vibration were tested in all extremities. Proprioception tested in the upper extremities was normal. Coordination: Rapid alternating movements in the fingers/hands was normal. Finger-to-nose maneuver  normal without evidence of ataxia, dysmetria or  tremor. Gait and station: Patient walks without assistive device . He is a little stooped now- new . Strength within normal limits. Stance is stable and normal. Tandem gait remains  fragmented.  His Gait is still unsteady and ataxic. Turns either way with 4 Steps.    Status post left hip replacement  2014 .  Deep tendon reflexes: in the  upper and lower extremities are symmetric - Babinski maneuver response is downgoing. The patient was advised of the nature of the diagnosed dementia disorder , the treatment options and risks for general a health and wellness arising from not treating the condition.  I spent more than 25  minutes of face to face time with the patient and his wife. Greater than 50% of time was spent in counseling and coordination of care. We have discussed the diagnosis and differential and I answered the patient's questions.     Assessment:  After physical and neurologic examination, review of laboratory studies,  Personal review of imaging studies, reports of other /same  Imaging studies ,  Results of polysomnography/ neurophysiology testing and pre-existing records as far as provided in visit., my assessment is   1)  Severe memory decline. 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 Slowed EEG. All confirms Alzheimer's dementia, advanced. status post brain radiation for cancer.  MMSE was 13 points only, no longer driving.  2) unintended weight loss, over 3 month over 30 pounds, some diffuse discomfort, chronic constipation. Lost not just fat, but muscle mass in spite of reportedly eating well.   Plan:  Treatment plan and additional workup :  Call Dr. Hilarie [name] regarding this weight loss, normal upper GI- can we do ultrasound, non contrast abdominal . CT or MRI of  abdomen to look at Pancreatic changes?   Bradycardia exacerbated - cannot take Aricept!!!! Namenda dose  10 mg bid. from today on.   Stay physically active, get enough sleep, plan cognitive challenging activities for the morning and physical activities for the afternoons.  Moderate and  well-balanced diet, good appetite. Marland Kitchen [employer] discontinued- needs a driver. 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  Rv in 4-6 month.   Asencion Partridge Bryn Saline MD  04/20/2018   CC: Josetta Huddle, Md [address] [place], [place] [address]

## 2018-04-22 ENCOUNTER — Ambulatory Visit (INDEPENDENT_AMBULATORY_CARE_PROVIDER_SITE_OTHER)
Admission: RE | Admit: 2018-04-22 | Discharge: 2018-04-22 | Disposition: A | Discharge status: Home / Self Care | Payer: Medicare Other | Source: Ambulatory Visit | Attending: Nurse Practitioner | Admitting: Nurse Practitioner

## 2018-04-22 ENCOUNTER — Ambulatory Visit (INDEPENDENT_AMBULATORY_CARE_PROVIDER_SITE_OTHER): Payer: Medicare Other | Admitting: Nurse Practitioner

## 2018-04-22 ENCOUNTER — Other Ambulatory Visit: Payer: Self-pay | Admitting: Nurse Practitioner

## 2018-04-22 ENCOUNTER — Other Ambulatory Visit (INDEPENDENT_AMBULATORY_CARE_PROVIDER_SITE_OTHER): Payer: Medicare Other

## 2018-04-22 ENCOUNTER — Encounter: Payer: Self-pay | Admitting: Nurse Practitioner

## 2018-04-22 VITALS — BP 116/68 | HR 62 | Ht 75.0 in | Wt 189.0 lb

## 2018-04-22 DIAGNOSIS — R634 Abnormal weight loss: Secondary | ICD-10-CM | POA: Diagnosis not present

## 2018-04-22 DIAGNOSIS — M546 Pain in thoracic spine: Secondary | ICD-10-CM

## 2018-04-22 DIAGNOSIS — R1030 Lower abdominal pain, unspecified: Secondary | ICD-10-CM

## 2018-04-22 DIAGNOSIS — M549 Dorsalgia, unspecified: Secondary | ICD-10-CM | POA: Diagnosis not present

## 2018-04-22 LAB — COMPREHENSIVE METABOLIC PANEL
ALBUMIN: 4.5 g/dL (ref 3.5–5.2)
ALK PHOS: 67 U/L (ref 39–117)
ALT: 15 U/L (ref 0–53)
AST: 16 U/L (ref 0–37)
BUN: 20 mg/dL (ref 6–23)
CALCIUM: 9.7 mg/dL (ref 8.4–10.5)
CO2: 32 mEq/L (ref 19–32)
CREATININE: 1.06 mg/dL (ref 0.40–1.50)
Chloride: 102 mEq/L (ref 96–112)
GFR: 72.58 mL/min (ref >60.00)
Glucose, Bld: 122 mg/dL — ABNORMAL HIGH (ref 70–99)
Potassium: 3.9 mEq/L (ref 3.5–5.1)
SODIUM: 142 meq/L (ref 135–145)
Total Bilirubin: 0.6 mg/dL (ref 0.2–1.2)
Total Protein: 6.9 g/dL (ref 6.0–8.3)

## 2018-04-22 NOTE — Patient Instructions (Addendum)
If you are age 74 or older, your body mass index should be between 23-30. Your Body mass index is 23.62 kg/m. If this is out of the aforementioned range listed, please consider follow up with your Primary Care Provider.  If you are age 65 or younger, your body mass index should be between 19-25. Your Body mass index is 23.62 kg/m. If this is out of the aformentioned range listed, please consider follow up with your Primary Care Provider.   Your provider has requested that you go to the basement level for lab work and x-ray of the spine before leaving today. Press "B" on the elevator. The lab is located at the first door on the left as you exit the elevator.   You have been scheduled for a CT scan of the abdomen and pelvis at [facility] ([address]---this is in the same building as Press [facility]).   You are scheduled on 05/05/18 at 2:30 pm. You should arrive 15 minutes prior to your appointment time for registration. Please follow the written instructions below on the day of your exam:  WARNING: IF YOU ARE ALLERGIC TO IODINE/X-RAY DYE, PLEASE NOTIFY RADIOLOGY IMMEDIATELY AT [phone]! YOU WILL BE GIVEN A 13 HOUR PREMEDICATION PREP.  1) Do not eat anything after 10:30 am (4 hours prior to your test) 2) You have been given 2 bottles of oral contrast to drink. The solution may taste better if refrigerated, but do NOT add ice or any other liquid to this solution. Shake well before drinking.    Drink 1 bottle of contrast @ 12:30 pm (2 hours prior to your exam)  Drink 1 bottle of contrast @ 1:30 pm (1 hour prior to your exam)  You may take any medications as prescribed with a small amount of water except for the following: Metformin, Glucophage, Glucovance, Avandamet, Riomet, Fortamet, Actoplus Met, Janumet, Glumetza or Metaglip. The above medications must be held the day of the exam AND 48 hours after the exam.  The purpose of you drinking the oral contrast is to aid in the  visualization of your intestinal tract. The contrast solution may cause some diarrhea. Before your exam is started, you will be given a small amount of fluid to drink. Depending on your individual set of symptoms, you may also receive an intravenous injection of x-ray contrast/dye. Plan on being at [facility] for 30 minutes or longer, depending on the type of exam you are having performed.  This test typically takes 30-45 minutes to complete.  If you have any questions regarding your exam or if you need to reschedule, you may call the CT department at [phone] between the hours of 8:00 am and 5:00 pm, Monday-Friday.  ________________________________________________________________________ Will call with results.  Thank you for choosing me and [facility].   Tye Savoy, NP

## 2018-04-22 NOTE — Progress Notes (Addendum)
IMPRESSION and PLAN:    #1. 72 yo male with multiple medical problems not limited to AFib on coumadin, CAD / CABG, hx of prostate cancer, and Alzheimers dementia presenting with unexplained weight loss of 25 pounds in last year. Similar scenario in 2014 attributed to all that he had been through with prostate cancer treatment. His weight did finally recover.  -other than intermittent mid lower abdominal pain in absence of urinary sx or bowel sx patient has no symptoms to direct evaluation. Will start with a CT scan to look for underlying malignancy. BMET to check renal function today  -If CT scan negative and continues to lose weight then consider EGD -thoracic spine bone deformity. May be more prominent now with all the weight loss. Wife never noticed it before. Causes patient discomfort to lay on firm surface. Will get spine xray and call with results.   Addendum 06/21/18.  I did not have current labs when I saw the patient late June.  I now have labs from patient's PCP with [facility] physicians.  On 03/29/2018 patient's CBC was normal except for platelet count of 107.  His platelets have been low since at least May 2017 and tend to hover around 100 K.  Hemoglobin was 14.3, MCV 87.  Renal function normal.  Albumin was normal at 4.2, liver chemistries normal.  Serum calcium normal.   #2. Chronic constipation. He had done fairly well managing constipation with suppositories.  Having less BMs lately but also eating less. No rectal bleeding.  -up to to date on colon cancer screening. Last colonoscopy was with [facility] in Feb 2015. The exam was complete, prep "good" with findings of mild distal radiation proctitis -No indication for repeat colonoscopy at this point.   #3. Hx of prostate cancer with brain met? He is s/p prostatectomy and brain radiation  Addendum: Reviewed and agree with initial management. Jerene Bears, MD        HPI:    Chief Complaint: weight loss   Patient is a  72 year old male with multiple medical problems not limited to DM 2, COPD, Factor V Leiden deficiency, chronic atrial fibrillation on Coumadin, CAD / remote CABG, history of prostate cancer, status post prostatectomy, and history of Alzheimer's.  Patient is known to Dr. Hilarie Fredrickson who saw him last in July 2018 for constipation. Last colonoscopy was done by Dr. Wynetta Emery with [facility] February 2015 revealing mild radiation proctitis, otherwise normal.  Repeat exam was recommended in 2020 due to his history of polyps  Patient has been referred by Dr. Brett Fairy for unintentional weight loss. Wife provides most of the history. Patient seen by Dr. Brett Fairy yesterday, felt to have progressive dementia with severe memory decline. He was noted to have lost muscle mass and reported significant weight loss. Patient referred to [place] for further evaluation.  Per wife, recent labs with [name] office were unremarkable. His weight is in fact down ~ 25 pounds since May 2018. Wife says he isn't skipping meals, appetite is sufficient. He has lost so much weight that vertebrae are protruding and causing some back pain if lays on hard surface  He doesn't complain of nausea but does mention mid lower abdominal pain. There isn't any correlation between the pain and BMs. No blood in urine, no dysuria.   UGI series 04/01/18 - silent aspiration into trachea reaching LLL bronchial tree, esophagus normal appearing, good gastric emptying.    Review of systems:   No chest pain, no sob, no  fevers. No urinary sx    Past Medical History:  Diagnosis Date  . Adrenal adenoma    bilateral  . Ascending aortic aneurysm ([place])    monitored by dr dr Daneen Schick-  stable 4.4cm x 4.3cm per CT 06-01-2014  . At risk for sleep apnea    STOP-BANG= 6     SENT TO PCP 03-05-2015  . Borderline diabetes mellitus   . Chronic anticoagulation   . Chronic atrial fibrillation ([place])   . Chronic diastolic heart failure ([place])   . Chronic idiopathic constipation     . Coronary artery disease    cardiologist-  dr Daneen Schick--  s/p  CABG x2 2000/   last myoview 2012-- normal  . Diabetes mellitus without complication ([place])   . Factor 5 Leiden mutation, heterozygous ([place])   . Gross hematuria   . History of adenomatous polyp of colon    2009  . History of basal cell carcinoma excision    forehead  . History of DVT of lower extremity    x2 1980's  . History of gout   . History of prostate cancer oncologist --dr Rafael Bihari [facility]--  no recurrence since 2013   dx 2004--  s/p  radical retropubic prostatectomy/  recurrence 2006  and 2013 s/p radiation at [facility]  and completed Hormone tx  . Hx of radiation therapy    external beam proton radiation 2006  . Hypertension   . Left ureteral stone   . Multiple thyroid nodules    Benign bx done July 2014  . OA (osteoarthritis)   . Radiation proctitis   . Recovering alcoholic in remission ([place])   . S/P CABG x 2    09-17-1999  . Secondary pulmonary hypertension     Patient's surgical history, family medical history, social history, medications and allergies were all reviewed in Epic    Physical Exam:     BP 116/68   Pulse 62   Ht _0  (1.905 m)   Wt 189 lb (85.7 kg)   BMI 23.62 kg/m   GENERAL:  Pleasant male in NAD PSYCH: : Cooperative, normal affect EENT:  conjunctiva pink, mucous membranes moist, neck supple without masses CARDIAC:  RRR,, no peripheral edema PULM: Normal respiratory effort, lungs CTA bilaterally, no wheezing ABDOMEN:  Nondistended, soft, nontender. No obvious masses, no hepatomegaly,  normal bowel sounds SKIN:  turgor, no lesions seen Musculoskeletal:  Unusual appearing bony prominence on thoracic spine NEURO: Alert and oriented x 3, no focal neurologic deficits   Jacob [name] , NP 04/22/2018, 5:21 PM

## 2018-04-26 ENCOUNTER — Encounter: Payer: Self-pay | Admitting: Nurse Practitioner

## 2018-04-27 ENCOUNTER — Telehealth: Payer: Self-pay | Admitting: Neurology

## 2018-04-27 NOTE — Telephone Encounter (Signed)
Patient's wife want's Dr. Brett Fairy to call her because Dr. Dr Hilarie Fredrickson , [facility]. Has a plan and patient has to go there next week and patient's wants to run a few details buy Dr. Brett Fairy first.

## 2018-04-27 NOTE — Telephone Encounter (Signed)
Tried to reach Jacob [name] and Jacob [name] by phone 17.48.  Asked for call back tomorrow. CD

## 2018-04-28 NOTE — Telephone Encounter (Signed)
Pts wife returning Dr. Edwena Felty call, stating she can be reached at [phone]

## 2018-04-29 ENCOUNTER — Telehealth: Payer: Self-pay | Admitting: Nurse Practitioner

## 2018-04-29 NOTE — Telephone Encounter (Signed)
Dr Dohmeier made contact with the patient's wife.

## 2018-04-30 NOTE — Telephone Encounter (Signed)
Left message to advise Jacob [name] is out of the office. What are her questions?  CT is 05/05/18

## 2018-05-04 DIAGNOSIS — Z7901 Long term (current) use of anticoagulants: Secondary | ICD-10-CM | POA: Diagnosis not present

## 2018-05-05 ENCOUNTER — Ambulatory Visit (INDEPENDENT_AMBULATORY_CARE_PROVIDER_SITE_OTHER)
Admission: RE | Admit: 2018-05-05 | Discharge: 2018-05-05 | Disposition: A | Discharge status: Home / Self Care | Payer: Medicare Other | Source: Ambulatory Visit | Attending: Nurse Practitioner | Admitting: Nurse Practitioner

## 2018-05-05 DIAGNOSIS — R1030 Lower abdominal pain, unspecified: Secondary | ICD-10-CM

## 2018-05-05 DIAGNOSIS — R634 Abnormal weight loss: Secondary | ICD-10-CM

## 2018-05-05 DIAGNOSIS — N200 Calculus of kidney: Secondary | ICD-10-CM | POA: Diagnosis not present

## 2018-05-05 DIAGNOSIS — M546 Pain in thoracic spine: Secondary | ICD-10-CM

## 2018-05-05 MED ORDER — IOPAMIDOL (ISOVUE-300) INJECTION 61%
100.0000 mL | Freq: Once | INTRAVENOUS | Status: AC | PRN
  Administered 2018-05-05: 100 mL via INTRAVENOUS

## 2018-05-06 ENCOUNTER — Telehealth: Payer: Self-pay | Admitting: Neurology

## 2018-05-06 ENCOUNTER — Telehealth: Payer: Self-pay | Admitting: Nurse Practitioner

## 2018-05-06 NOTE — Telephone Encounter (Signed)
-----   Message from Larey Seat, MD sent at 05/06/2018  2:16 PM EDT ----- Relieved that pancreas looks normal, no malignancy found. Would add protein shakes and snacks through the day. CD

## 2018-05-06 NOTE — Telephone Encounter (Signed)
See the result note please.

## 2018-05-06 NOTE — Telephone Encounter (Signed)
Called the pt and wife answered. The patient was with her. Informed both that the CT ABD showed that the pancreas looked normal and there was no malignancy found. Informed them Dr Brett Fairy would recommend to add protein shakes and snacks during the day to help. Pt's wife verbalized understanding.

## 2018-05-10 DIAGNOSIS — D0439 Carcinoma in situ of skin of other parts of face: Secondary | ICD-10-CM | POA: Diagnosis not present

## 2018-05-10 DIAGNOSIS — L821 Other seborrheic keratosis: Secondary | ICD-10-CM | POA: Diagnosis not present

## 2018-05-10 DIAGNOSIS — Z85828 Personal history of other malignant neoplasm of skin: Secondary | ICD-10-CM | POA: Diagnosis not present

## 2018-05-10 DIAGNOSIS — D485 Neoplasm of uncertain behavior of skin: Secondary | ICD-10-CM | POA: Diagnosis not present

## 2018-05-10 DIAGNOSIS — D1801 Hemangioma of skin and subcutaneous tissue: Secondary | ICD-10-CM | POA: Diagnosis not present

## 2018-05-10 DIAGNOSIS — D225 Melanocytic nevi of trunk: Secondary | ICD-10-CM | POA: Diagnosis not present

## 2018-05-14 ENCOUNTER — Other Ambulatory Visit: Payer: Self-pay | Admitting: Cardiothoracic Surgery

## 2018-05-14 DIAGNOSIS — I712 Thoracic aortic aneurysm, without rupture, unspecified: Secondary | ICD-10-CM

## 2018-05-19 DIAGNOSIS — D696 Thrombocytopenia, unspecified: Secondary | ICD-10-CM | POA: Diagnosis not present

## 2018-05-19 DIAGNOSIS — I2510 Atherosclerotic heart disease of native coronary artery without angina pectoris: Secondary | ICD-10-CM | POA: Diagnosis not present

## 2018-05-19 DIAGNOSIS — E538 Deficiency of other specified B group vitamins: Secondary | ICD-10-CM | POA: Diagnosis not present

## 2018-05-19 DIAGNOSIS — M109 Gout, unspecified: Secondary | ICD-10-CM | POA: Diagnosis not present

## 2018-05-19 DIAGNOSIS — I482 Chronic atrial fibrillation: Secondary | ICD-10-CM | POA: Diagnosis not present

## 2018-05-19 DIAGNOSIS — C61 Malignant neoplasm of prostate: Secondary | ICD-10-CM | POA: Diagnosis not present

## 2018-05-19 DIAGNOSIS — E042 Nontoxic multinodular goiter: Secondary | ICD-10-CM | POA: Diagnosis not present

## 2018-05-19 DIAGNOSIS — E559 Vitamin D deficiency, unspecified: Secondary | ICD-10-CM | POA: Diagnosis not present

## 2018-05-19 DIAGNOSIS — Z1389 Encounter for screening for other disorder: Secondary | ICD-10-CM | POA: Diagnosis not present

## 2018-05-19 DIAGNOSIS — Z79899 Other long term (current) drug therapy: Secondary | ICD-10-CM | POA: Diagnosis not present

## 2018-05-19 DIAGNOSIS — R1032 Left lower quadrant pain: Secondary | ICD-10-CM | POA: Diagnosis not present

## 2018-05-19 DIAGNOSIS — R6881 Early satiety: Secondary | ICD-10-CM | POA: Diagnosis not present

## 2018-05-19 DIAGNOSIS — E782 Mixed hyperlipidemia: Secondary | ICD-10-CM | POA: Diagnosis not present

## 2018-05-19 DIAGNOSIS — Z0000 Encounter for general adult medical examination without abnormal findings: Secondary | ICD-10-CM | POA: Diagnosis not present

## 2018-05-25 ENCOUNTER — Telehealth: Payer: Self-pay | Admitting: Interventional Cardiology

## 2018-05-25 ENCOUNTER — Ambulatory Visit: Payer: Medicare Other | Admitting: Cardiothoracic Surgery

## 2018-05-25 NOTE — Telephone Encounter (Signed)
New message   Patient's spouse calling, she would like a recommendation for a protein drink for the patient. Patient is losing weight without effort. She has already asked PCP and he was unable to provide info.

## 2018-05-25 NOTE — Telephone Encounter (Signed)
Spoke with Jinny Blossom, PharmD and she said no contraindication with protein shakes and meds pt is on other than they needed to make sure to use one with as little Vit K as possible since pt is on Warfarin.  Spoke with wife and made her aware of recommendations.  Advised wife to contact PCP once they choose which protein shake and let them know about Vitamin K so they can monitor INR.  Advised her that Boost Protein is one option.  Wife verbalized understanding and was appreciative for call.

## 2018-05-26 ENCOUNTER — Inpatient Hospital Stay: Admission: RE | Admit: 2018-05-26 | Payer: Medicare Other | Source: Ambulatory Visit

## 2018-06-01 DIAGNOSIS — I482 Chronic atrial fibrillation: Secondary | ICD-10-CM | POA: Diagnosis not present

## 2018-06-01 DIAGNOSIS — Z8546 Personal history of malignant neoplasm of prostate: Secondary | ICD-10-CM | POA: Diagnosis not present

## 2018-06-01 DIAGNOSIS — M109 Gout, unspecified: Secondary | ICD-10-CM | POA: Diagnosis not present

## 2018-06-01 DIAGNOSIS — E782 Mixed hyperlipidemia: Secondary | ICD-10-CM | POA: Diagnosis not present

## 2018-06-01 DIAGNOSIS — Z79899 Other long term (current) drug therapy: Secondary | ICD-10-CM | POA: Diagnosis not present

## 2018-06-01 DIAGNOSIS — E559 Vitamin D deficiency, unspecified: Secondary | ICD-10-CM | POA: Diagnosis not present

## 2018-06-01 DIAGNOSIS — C61 Malignant neoplasm of prostate: Secondary | ICD-10-CM | POA: Diagnosis not present

## 2018-06-01 DIAGNOSIS — Z7901 Long term (current) use of anticoagulants: Secondary | ICD-10-CM | POA: Diagnosis not present

## 2018-06-01 DIAGNOSIS — D696 Thrombocytopenia, unspecified: Secondary | ICD-10-CM | POA: Diagnosis not present

## 2018-06-01 DIAGNOSIS — E538 Deficiency of other specified B group vitamins: Secondary | ICD-10-CM | POA: Diagnosis not present

## 2018-06-03 ENCOUNTER — Ambulatory Visit (INDEPENDENT_AMBULATORY_CARE_PROVIDER_SITE_OTHER): Payer: Medicare Other | Admitting: Cardiothoracic Surgery

## 2018-06-03 ENCOUNTER — Ambulatory Visit
Admission: RE | Admit: 2018-06-03 | Discharge: 2018-06-03 | Disposition: A | Discharge status: Home / Self Care | Payer: Medicare Other | Source: Ambulatory Visit | Attending: Cardiothoracic Surgery | Admitting: Cardiothoracic Surgery

## 2018-06-03 VITALS — BP 125/71 | HR 72 | Resp 20 | Ht 74.0 in | Wt 185.0 lb

## 2018-06-03 DIAGNOSIS — I712 Thoracic aortic aneurysm, without rupture, unspecified: Secondary | ICD-10-CM

## 2018-06-03 MED ORDER — IOPAMIDOL (ISOVUE-370) INJECTION 76%
75.0000 mL | Freq: Once | INTRAVENOUS | Status: AC | PRN
  Administered 2018-06-03: 75 mL via INTRAVENOUS

## 2018-06-03 NOTE — Patient Instructions (Signed)

## 2018-06-03 NOTE — Progress Notes (Signed)
[address]       [place] [address]             [phone]                    Jacob [name] [facility] Medical Record #[id number] Date of Birth: 12-24-1945  Referring: Belva Crome, MD Primary Care: Josetta Huddle, MD  Chief Complaint:    Chief Complaint  Patient presents with  . Thoracic Aortic Aneurysm    1 year f/u with Chest CTA    History of Present Illness:    Jacob [name] 72 y.o. male is seen in the office for follow-up of known dilated ascending aorta.  A follow-up CT scan was performed today with contrast.  The patient and his wife note that he continues to have trouble with memory.  He has also had significant weight loss over the past year down to 186 pounds, GI evaluation was done.    The patient has known coronary occlusive disease having had coronary artery bypass grafting done by me, 2 vessels with the mammary to the LAD  18 years ago. The patient's had no further coronary artery problems, he has not had a repeat  Cardiac catheterization. He does have chronic atrial fibrillation and has been on Coumadin. 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 PET scan in 2013 and subsequent CT scans in 2014, 2015, and 2715 show mildly dilated ascending aorta 4.3 cm at the mid ascending aorta and 4.5 cm at the sinus of Valsalva. Echo shows a 3 leaflet aortic valve. Patient has no definite family history of aortic dissection. His father did die suddenly in his 74s while working under his house this was presumed to be a myocardial infarction but not confirmed.      Current Activity/ Functional Status:  Patient is independent with mobility/ambulation, transfers, ADL's, IADL's.   Zubrod Score: At the time of surgery this patient's most appropriate activity status/level should be described as: []     0    Normal activity, no symptoms [x]     1    Restricted in physical strenuous activity but  ambulatory, able to do out light work []     2    Ambulatory and capable of self care, unable to do work activities, up and about               >50 % of waking hours                              []     3    Only limited self care, in bed greater than 50% of waking hours []     4    Completely disabled, no self care, confined to bed or chair []     5    Moribund   Past Medical History:  Diagnosis Date  . Adrenal adenoma    bilateral  . Ascending aortic aneurysm ([place])    monitored by dr dr Daneen Schick-  stable 4.4cm x 4.3cm per CT 06-01-2014  . At risk for sleep apnea    STOP-BANG= 6     SENT TO PCP 03-05-2015  . Borderline diabetes mellitus   . Chronic anticoagulation   . Chronic atrial fibrillation ([place])   . Chronic diastolic heart failure ([place])   . Chronic idiopathic constipation   .  Coronary artery disease    cardiologist-  dr Daneen Schick--  s/p  CABG x2 2000/   last myoview 2012-- normal  . Diabetes mellitus without complication ([place])   . Factor 5 Leiden mutation, heterozygous ([place])   . Gross hematuria   . History of adenomatous polyp of colon    2009  . History of basal cell carcinoma excision    forehead  . History of DVT of lower extremity    x2 1980's  . History of gout   . History of prostate cancer oncologist --dr Rafael Bihari [facility]--  no recurrence since 2013   dx 2004--  s/p  radical retropubic prostatectomy/  recurrence 2006  and 2013 s/p radiation at [facility]  and completed Hormone tx  . Hx of radiation therapy    external beam proton radiation 2006  . Hypertension   . Left ureteral stone   . Multiple thyroid nodules    Benign bx done July 2014  . OA (osteoarthritis)   . Radiation proctitis   . Recovering alcoholic in remission ([place])   . S/P CABG x 2    09-17-1999  . Secondary pulmonary hypertension     Past Surgical History:  Procedure Laterality Date  . CARDIAC CATHETERIZATION  09-03-1999  dr Daneen Schick   Abnormal myoview/  80% mLAD,  total  occlusion pRCA , collaterals from pRCA segment and left CFX and LAD,  OM1 sig. lesion,  total occlusion very small OM2,  persived LVSF,  elevated end-diastolic pressure  . CARDIOVASCULAR STRESS TEST  03-04-2011   dr Daneen Schick   normal perfusion study/  attenuation artifact in inferior region of myocardium,  no ischemia or infarct/scar,  unable to gate wall motion due to irregular rhythm  . COLONOSCOPY WITH PROPOFOL [name] 12/27/2013   Procedure: COLONOSCOPY WITH PROPOFOL;  Surgeon: Garlan Fair, MD;  Location: [facility];  Service: Endoscopy;  Laterality: [name];  . CORONARY ARTERY BYPASS GRAFT  09-17-1999   dr Ranetta Armacost   x2 vessel--  LIMA to LAD and RCA  . CYSTOSCOPY WITH RETROGRADE PYELOGRAM, URETEROSCOPY AND STENT PLACEMENT Left 03/07/2015   Procedure: CYSTOSCOPY WITH RETROGRADE PYELOGRAM, URETEROSCOPY  WITH STONE EXTRACTION AND STENT PLACEMENT;  Surgeon: Rana Snare, MD;  Location: [facility];  Service: Urology;  Laterality: Left;  . EXCISIONAL BX OF MASS, LEFT DISTAL BRACHIUM  08-09-2002  . HEMORRHOID SURGERY  2000  . HOLMIUM LASER APPLICATION Left 9/67/8938   Procedure: HOLMIUM LASER APPLICATION;  Surgeon: Rana Snare, MD;  Location: [facility];  Service: Urology;  Laterality: Left;  . KNEE SURGERY    . LUMBAR DISKECOTMY AND FUSION  1972   L4 -- L5  . RADICAL RETROPUBIC PROSTATECTOMY  02-20-2003  . TONSILLECTOMY  as child  . TOTAL HIP ARTHROPLASTY Left 01/18/2013   Procedure: TOTAL HIP ARTHROPLASTY;  Surgeon: Garald Balding, MD;  Location: [facility];  Service: Orthopedics;  Laterality: Left;  Left Total Hip Arthroplasty  . TRANSTHORACIC ECHOCARDIOGRAM  05-10-2013  dr Daneen Schick   mild LVH/  55-60%/  severe LAE/  mild to moderate MR and TR/  mild RAE/  moderate to severe RV elevated systolic pressure/  IVC demonstates <50% collapse w/ respiration/  due to atrial fib. unable evaluate diastolic function    Family History  Problem Relation Age of Onset  .  Cancer Mother   . CAD Mother   . Heart attack Mother   . Heart attack Father   . Valvular heart disease  Father   . Thyroid disease Father   . Heart failure Father     Social History   Socioeconomic History  . Marital status: Married    Spouse name: Not on file  . Number of children: Not on file  . Years of education: Not on file  . Highest education level: Not on file  Occupational History  . Occupation: retired Forensic psychologist  Social Needs  . Financial resource strain: Not on file  . Food insecurity:    Worry: Not on file    Inability: Not on file  . Transportation needs:    Medical: Not on file    Non-medical: Not on file  Tobacco Use  . Smoking status: Former Smoker    Packs/day: 1.00    Years: 15.00    Pack years: 15.00    Last attempt to quit: 11/24/1985    Years since quitting: 32.5  . Smokeless tobacco: Never Used  Substance and Sexual Activity  . Alcohol use: No    Comment: Recovery alcoholic --  attends AA  . Drug use: No  . Sexual activity: Not on file  Lifestyle  . Physical activity:    Days per week: Not on file    Minutes per session: Not on file  . Stress: Not on file  Relationships  . Social connections:    Talks on phone: Not on file    Gets together: Not on file    Attends religious service: Not on file    Active member of club or organization: Not on file    Attends meetings of clubs or organizations: Not on file    Relationship status: Not on file  . Intimate partner violence:    Fear of current or ex partner: Not on file    Emotionally abused: Not on file    Physically abused: Not on file    Forced sexual activity: Not on file  Other Topics Concern  . Not on file  Social History Narrative  . Not on file    Social History   Tobacco Use  Smoking Status Former Smoker  . Packs/day: 1.00  . Years: 15.00  . Pack years: 15.00  . Last attempt to quit: 11/24/1985  . Years since quitting: 32.5  Smokeless Tobacco Never Used    Social History     Substance and Sexual Activity  Alcohol Use No   Comment: Recovery alcoholic --  attends AA     Allergies  Allergen Reactions  . Percocet [Oxycodone-Acetaminophen] Other (See Comments)    Paranoid  . Allopurinol Other (See Comments)    Thrombocytopenia   . Ciprofloxacin   . Crestor [Rosuvastatin Calcium]     Possible asthenia with severe weight loss 2014  . Dilaudid [Hydromorphone Hcl] Other (See Comments)    paranoid    Current Outpatient Medications  Medication Sig Dispense Refill  . carvedilol (COREG) 3.125 MG tablet Take 1 tablet (3.125 mg total) by mouth 2 (two) times daily with a meal. 180 tablet 3  . Cholecalciferol (VITAMIN D3) 2000 units TABS Take 1 tablet by mouth daily.     . colchicine 0.6 MG tablet Take 0.6 mg by mouth daily.     . Cyanocobalamin (B-12 COMPLIANCE INJECTION IJ) Inject as directed every 30 (thirty) days.     Marland Kitchen dutasteride (AVODART) 0.5 MG capsule Take 0.5 mg by mouth daily.    Marland Kitchen escitalopram (LEXAPRO) 10 MG tablet Take 10 mg by mouth every evening.     . folic  acid (FOLVITE) 1 MG tablet Take 1 mg by mouth daily.    . furosemide (LASIX) 40 MG tablet Take 40 mg by mouth daily.    . memantine (NAMENDA) 10 MG tablet Take 1 tablet (10 mg total) by mouth 2 (two) times daily. 180 tablet 3  . metformin (FORTAMET) 1000 MG (OSM) 24 hr tablet Take 1,000 mg by mouth 2 (two) times daily with a meal.     . Multiple Vitamins-Minerals (PRESERVISION AREDS) TABS Take 2 tablets by mouth daily.    . ramipril (ALTACE) 2.5 MG capsule Take 2.5 mg by mouth daily.    Mar[place] Kitchen warfarin (COUMADIN) 5 MG tablet Take 1-1.5 tablets (5-7.5 mg total) by mouth daily. Take 1 tablet daily except on Sundays take 1 1/2 tablet (7.5mg )     No current facility-administered medications for this visit.       Review of Systems:     Cardiac Review of Systems: Y or N  Chest Pain [  N ]  Resting SOB [ N ] Exertional SOB  [ N]  Orthopnea Aqua.Slicker  ]   Pedal Edema [ N ]    Palpitations [  N] Syncope  N  ]   Presyncope [ N ]  General Review of Systems: [Y] = yes [  ]=no Constitional: recent weight change Blue.[place]  ];  Wt loss over the last 12 months [place]   ] anorexia [ Y ]; fatigue [  ]; nausea [  ]; night sweats [  ]; fever [  ]; or chills [  ];          Dental: poor dentition[  ]; Last Dentist visit:   Eye : blurred vision [  ]; diplopia [   ]; vision changes [  ];  Amaurosis fugax[  ]; Resp: cough [  ];  wheezing[  ];  hemoptysis[  ]; shortness of breath[  ]; paroxysmal nocturnal dyspnea[  ]; dyspnea on exertion[  ]; or orthopnea[  ];  GI:  gallstones[  ], vomiting[  ];  dysphagia[  ]; melena[  ];  hematochezia [  ]; heartburn[  ];   Hx of  Colonoscopy[  ]; GU: kidney stones [  ]; hematuria[  ];   dysuria [  ];  nocturia[  ];  history of     obstruction [  ]; urinary frequency [  ]             Skin: rash, swelling[  ];, hair loss[  ];  peripheral edema[  ];  or itching[  ]; Musculosketetal: myalgias[  ];  joint swelling[  ];  joint erythema[  ];  joint pain[  ];  back pain[  ];  Heme/Lymph: bruising[  ];  bleeding[  ];  anemia[  ];  Neuro: TIA[  ];  headaches[  ];  stroke[  ];  vertigo[  ];  seizures[  ];   paresthesias[  ];  difficulty walking[  ];  Psych:depression[  ]; anxiety[  ];  Endocrine: diabetes[  ];  thyroid dysfunction[ y ];  Immunizations: Flu up to date [  ]; Pneumococcal up to date [  ];  Other:  Physical Exam: BP 125/71   Pulse 72   Resp 20   Ht 6\' 2"  (1.88 m)   Wt 185 lb (83.9 kg)   SpO2 98% Comment: RA  BMI 23.75 kg/m   PHYSICAL EXAMINATION: General appearance: alert, cooperative, appears older than stated age and no distress Neck: no adenopathy, no carotid  bruit, no JVD, supple, symmetrical, trachea midline and thyroid not enlarged, symmetric, no tenderness/mass/nodules Lymph nodes: Cervical, supraclavicular, and axillary nodes normal. Resp: clear to auscultation bilaterally Cardio: irregularly irregular rhythm GI: soft, non-tender; bowel sounds normal; no masses,  no  organomegaly Extremities: extremities normal, atraumatic, no cyanosis or edema and Homans sign is negative, no sign of DVT Neurologic: Mental status: Alert, oriented, thought content appropriate, Patient has obvious trouble remembering details, constantly asking his wife for answers, and readily admits he has a poor memory and has difficulty picking words  Diagnostic Studies & Laboratory data:     Recent Radiology Findings:   Ct Abdomen Pelvis W Contrast  Result Date: 05/05/2018 CLINICAL DATA:  Lower abdominal pain.  Loss of weight.  Back pain. EXAM: CT ABDOMEN AND PELVIS WITH CONTRAST TECHNIQUE: Multidetector CT imaging of the abdomen and pelvis was performed using the standard protocol following bolus administration of intravenous contrast. CONTRAST:  152mL ISOVUE-300 IOPAMIDOL (ISOVUE-300) INJECTION 61% COMPARISON:  CT scan February 26, 2015 FINDINGS: Lower chest: No acute abnormality. Hepatobiliary: Hepatic steatosis. The liver, gallbladder, and portal vein are otherwise unchanged and unremarkable. Pancreas: Unremarkable. No pancreatic ductal dilatation or surrounding inflammatory changes. Spleen: Numerous small cysts in the spleen.  No suspicious masses. Adrenals/Urinary Tract: Thickening of the left adrenal gland is stable, likely hyperplasia. A nodule in the right adrenal gland measures 14 mm today, unchanged by my measurement. There is a nonobstructive stone in the left kidney on series 2, image 17 measuring 5 mm. No other renal stones are noted. Extrarenal pelvises are identified. No hydronephrosis or acute perinephric stranding. No ureterectasis or ureteral stone. The bladder is unremarkable. Stomach/Bowel: The stomach and small bowel are normal. Moderate fecal loading is seen in the left side of the colon and distal transverse colon. The remainder of the colon is normal. The appendix is not seen but there is no secondary evidence of appendicitis. Vascular/Lymphatic: Atherosclerosis is seen in the  nonaneurysmal aorta, iliac vessels, and femoral vessels. No dissection. No adenopathy. Reproductive: Prostate is unremarkable. Other: No abdominal wall hernia or abnormality. No abdominopelvic ascites. Musculoskeletal: Left hip replacement. No change in the lumbar spine. Degenerative changes noted. IMPRESSION: 1. No acute abnormalities are identified. 2. Thickening of the left adrenal gland consistent with hyperplasia is stable. A right adrenal nodule measuring 14 mm is stable. 3. Nonobstructive stone in the left kidney. 4. The appendix is not visualized but there is no evidence of appendicitis. 5. Fecal loading in the left side of the colon. 6. Atherosclerosis in the abdominal aorta. Electronically Signed   By: Dorise Bullion III M.D   On: 05/05/2018 16:22   Ct Angio Chest Aorta W/cm &/or Wo/cm  Result Date: 06/03/2018 CLINICAL DATA:  Follow-up thoracic aortic aneurysm EXAM: CT ANGIOGRAPHY CHEST WITH CONTRAST TECHNIQUE: Multidetector CT imaging of the chest was performed using the standard protocol during bolus administration of intravenous contrast. Multiplanar CT image reconstructions and MIPs were obtained to evaluate the vascular anatomy. CONTRAST:  71mL ISOVUE-370 IOPAMIDOL (ISOVUE-370) INJECTION 76% COMPARISON:  05/14/2017 FINDINGS: Cardiovascular: Maximal diameter of the ascending aorta at the sinus of Valsalva, sino-tubular junction, and ascending aorta are 4.4 cm, 3.4 cm, and 4.2 cm. There is no evidence of aortic dissection. There is no obvious intramural hematoma. Aortic atherosclerotic calcifications of the aortic arch and great vessels. Great vessels are patent. Advanced 3 level coronary artery calcifications. Status post CABG. Left atrium is moderately dilated. Stable low-density in the anterior atrial appendage. Mediastinum/Nodes: No abnormal mediastinal adenopathy. Stable  right thyroid mass. Left thyroid calcification is unchanged. No pericardial effusion. Lungs/Pleura: Subsegmental atelectasis  at the lung bases. No pneumothorax or pleural effusion. Upper Abdomen: There is a small amount of free fluid anterior to the liver. There is nonspecific stranding in the adjacent intraperitoneal fat. Prominence of the adrenal glands is stable. Musculoskeletal: No new vertebral compression deformity. Chronic compression fractures are noted. No acute rib fracture. Post sternotomy with healing. Review of the MIP images confirms the above findings. IMPRESSION: Aneurysmal dilatation of the ascending aorta is not significantly changed at 4.2 cm. Aortic Atherosclerosis (ICD10-I70.0). Electronically Signed   By: Marybelle Killings M.D.   On: 06/03/2018 14:21    I have independently reviewed the above radiology studies  and reviewed the findings with the patient.   Recent Lab Findings: Lab Results  Component Value Date   WBC 4.5 02/02/2017   HGB 13.0 02/02/2017   HCT 39.8 02/02/2017   PLT 88 (L) 02/02/2017   GLUCOSE 122 (H) 04/22/2018   ALT 15 04/22/2018   AST 16 04/22/2018   NA 142 04/22/2018   K 3.9 04/22/2018   CL 102 04/22/2018   CREATININE 1.06 04/22/2018   BUN 20 04/22/2018   CO2 32 04/22/2018   INR 1.94 02/02/2017   HGBA1C 5.8 (H) 01/18/2013   CT chest :  Ct Abdomen Pelvis W Contrast  Result Date: 05/05/2018 CLINICAL DATA:  Lower abdominal pain.  Loss of weight.  Back pain. EXAM: CT ABDOMEN AND PELVIS WITH CONTRAST TECHNIQUE: Multidetector CT imaging of the abdomen and pelvis was performed using the standard protocol following bolus administration of intravenous contrast. CONTRAST:  163mL ISOVUE-300 IOPAMIDOL (ISOVUE-300) INJECTION 61% COMPARISON:  CT scan February 26, 2015 FINDINGS: Lower chest: No acute abnormality. Hepatobiliary: Hepatic steatosis. The liver, gallbladder, and portal vein are otherwise unchanged and unremarkable. Pancreas: Unremarkable. No pancreatic ductal dilatation or surrounding inflammatory changes. Spleen: Numerous small cysts in the spleen.  No suspicious masses.  Adrenals/Urinary Tract: Thickening of the left adrenal gland is stable, likely hyperplasia. A nodule in the right adrenal gland measures 14 mm today, unchanged by my measurement. There is a nonobstructive stone in the left kidney on series 2, image 17 measuring 5 mm. No other renal stones are noted. Extrarenal pelvises are identified. No hydronephrosis or acute perinephric stranding. No ureterectasis or ureteral stone. The bladder is unremarkable. Stomach/Bowel: The stomach and small bowel are normal. Moderate fecal loading is seen in the left side of the colon and distal transverse colon. The remainder of the colon is normal. The appendix is not seen but there is no secondary evidence of appendicitis. Vascular/Lymphatic: Atherosclerosis is seen in the nonaneurysmal aorta, iliac vessels, and femoral vessels. No dissection. No adenopathy. Reproductive: Prostate is unremarkable. Other: No abdominal wall hernia or abnormality. No abdominopelvic ascites. Musculoskeletal: Left hip replacement. No change in the lumbar spine. Degenerative changes noted. IMPRESSION: 1. No acute abnormalities are identified. 2. Thickening of the left adrenal gland consistent with hyperplasia is stable. A right adrenal nodule measuring 14 mm is stable. 3. Nonobstructive stone in the left kidney. 4. The appendix is not visualized but there is no evidence of appendicitis. 5. Fecal loading in the left side of the colon. 6. Atherosclerosis in the abdominal aorta. Electronically Signed   By: Dorise Bullion III M.D   On: 05/05/2018 16:22   Ct Angio Chest Aorta W/cm &/or Wo/cm  Result Date: 06/03/2018 CLINICAL DATA:  Follow-up thoracic aortic aneurysm EXAM: CT ANGIOGRAPHY CHEST WITH CONTRAST TECHNIQUE: Multidetector CT imaging  of the chest was performed using the standard protocol during bolus administration of intravenous contrast. Multiplanar CT image reconstructions and MIPs were obtained to evaluate the vascular anatomy. CONTRAST:  59mL  ISOVUE-370 IOPAMIDOL (ISOVUE-370) INJECTION 76% COMPARISON:  05/14/2017 FINDINGS: Cardiovascular: Maximal diameter of the ascending aorta at the sinus of Valsalva, sino-tubular junction, and ascending aorta are 4.4 cm, 3.4 cm, and 4.2 cm. There is no evidence of aortic dissection. There is no obvious intramural hematoma. Aortic atherosclerotic calcifications of the aortic arch and great vessels. Great vessels are patent. Advanced 3 level coronary artery calcifications. Status post CABG. Left atrium is moderately dilated. Stable low-density in the anterior atrial appendage. Mediastinum/Nodes: No abnormal mediastinal adenopathy. Stable right thyroid mass. Left thyroid calcification is unchanged. No pericardial effusion. Lungs/Pleura: Subsegmental atelectasis at the lung bases. No pneumothorax or pleural effusion. Upper Abdomen: There is a small amount of free fluid anterior to the liver. There is nonspecific stranding in the adjacent intraperitoneal fat. Prominence of the adrenal glands is stable. Musculoskeletal: No new vertebral compression deformity. Chronic compression fractures are noted. No acute rib fracture. Post sternotomy with healing. Review of the MIP images confirms the above findings. IMPRESSION: Aneurysmal dilatation of the ascending aorta is not significantly changed at 4.2 cm. Aortic Atherosclerosis (ICD10-I70.0). Electronically Signed   By: Marybelle Killings M.D.   On: 06/03/2018 14:21   CLINICAL DATA:  F/u to aortic aneurysm  EXAM: CT ANGIOGRAPHY CHEST WITH CONTRAST  TECHNIQUE: Multidetector CT imaging of the chest was performed using the standard protocol during bolus administration of intravenous contrast. Multiplanar CT image reconstructions and MIPs were obtained to evaluate the vascular anatomy.  CONTRAST:  100 mL Isovue 370 IV  COMPARISON:  CT abdomen 02/26/2015, chest 06/01/2014  FINDINGS: Vascular: Right arm IV contrast administration. The SVC is patent. Right atrial  enlargement. Mild right ventral to the ventricular dilatation. Satisfactory opacification of pulmonary arteries noted, and there is no evidence of pulmonary emboli. Patent bilateral pulmonary veins drain into the left atrium. Left atrial enlargement. Incomplete opacification distally in the left atrial appendage suggesting thrombus; findings reviewed with Dr. Weber Cooks, who concurs. Moderate coronary calcifications. Previous CABG. Thoracic aortic segmental diameters as follows:  4.4 cm sinuses of Valsalva  3.6 cm sino-tubular junction  4.5 cm proximal ascending (previously 4.4 cm)  3.4 cm distal ascending/proximal arch  2.7 cm distal arch  3.3 cm proximal descending  2.5 cm distal descending  Scattered atheromatous calcifications in the distal arch and descending thoracic segment. No intra luminal thrombus. No dissection or stenosis. Classic 3 vessel brachiocephalic arterial origin anatomy without proximal stenosis. Tortuous distal descending segment.  Mediastinum/Lymph Nodes: 3.6 cm mass from the inferior margin right lobe of the thyroid. No hilar or mediastinal adenopathy. No pericardial effusion.  Lungs/Pleura: Patchy infiltrate or subsegmental atelectasis in posterior basal segment left lower lobe. Minimal dependent atelectasis posteriorly in both lower lobes. No pleural effusion. No pneumothorax.  Upper abdomen: No acute findings.  Musculoskeletal: Stable T7 and L1 compression deformities. Previous median sternotomy.  Review of the MIP images confirms the above findings.  IMPRESSION: 1. 4.5 cm ascending aortic aneurysm without complicating features. Recommend semi-annual imaging followup by CTA or MRA and referral to cardiothoracic surgery if not already obtained. This recommendation follows 2010 ACCF/AHA/AATS/ACR/ASA/SCA/SCAI/SIR/STS/SVM Guidelines for the Diagnosis and Management of Patients With Thoracic Aortic Disease. Circulation. 2010; 121:  W431-V400 2. Thrombus versus incomplete opacification in the left atrial appendage. Correlate with TEE. 3. Patchy infiltrate or subsegmental atelectasis, posterior left lower lobe. 4. Right  thyroid mass as previously described.   Electronically Signed   By: Lucrezia Europe M.D.   On: 07/25/2016 14:08  ECHO: 2016 History:   PMH:  Ascending aortic aneurysm. Acquired from the patient and from the patient&'s chart.  Atrial fibrillation. Coronary artery disease.  Congestive heart failure.  Risk factors: Hypertension.  ------------------------------------------------------------------- Study Conclusions  - Left ventricle: The cavity size was normal. There was mild   concentric hypertrophy. Systolic function was normal. The   estimated ejection fraction was in the range of 60% to 65%. Wall   motion was normal; there were no regional wall motion   abnormalities. There was no evidence of elevated ventricular   filling pressure by Doppler parameters. - Aorta: Ascending aortic diameter: 41 mm (S). - Ascending aorta: The ascending aorta was mildly dilated. - Mitral valve: Calcified annulus. - Left atrium: The atrium was moderately dilated. - Right atrium: The atrium was mildly dilated. - Pulmonary arteries: Systolic pressure was mildly increased. PA   peak pressure: 33 mm Hg (S).  Echocardiography.  M-mode, complete 2D, spectral Doppler, and color Doppler.  Birthdate:  Patient birthdate: 11-Feb-1946.  Age:  Patient is 72 yr old.  Sex:  Gender: male.    BMI: 28.6 kg/m^2.  Blood pressure:     127/62  Patient status:  Outpatient.  Study date: Study date: 05/29/2015. Study time: 01:52 PM.  Location:  [facility]  -------------------------------------------------------------------  ------------------------------------------------------------------- Left ventricle:  The cavity size was normal. There was mild concentric hypertrophy. Systolic function was normal. The  estimated ejection fraction was in the range of 60% to 65%. Wall motion was normal; there were no regional wall motion abnormalities. There was no evidence of elevated ventricular filling pressure by Doppler parameters.  ------------------------------------------------------------------- Aortic valve:   Trileaflet; normal thickness leaflets. Mobility was not restricted.  Doppler:  Transvalvular velocity was within the normal range. There was no stenosis. There was no regurgitation.   ------------------------------------------------------------------- Aorta:  Ascending aorta: The ascending aorta was mildly dilated.  ------------------------------------------------------------------- Mitral valve:   Calcified annulus. Mobility was not restricted. Doppler:  Transvalvular velocity was within the normal range. There was no evidence for stenosis. There was no regurgitation.    Peak gradient (D): 3 mm Hg.  ------------------------------------------------------------------- Left atrium:  The atrium was moderately dilated.  ------------------------------------------------------------------- Right ventricle:  The cavity size was normal. Wall thickness was normal. Systolic function was normal.  ------------------------------------------------------------------- Pulmonic valve:   Poorly visualized.  Structurally normal valve. Cusp separation was normal.  Doppler:  Transvalvular velocity was within the normal range. There was no evidence for stenosis. There was trivial regurgitation.  ------------------------------------------------------------------- Tricuspid valve:   Structurally normal valve.    Doppler: Transvalvular velocity was within the normal range. There was mild regurgitation.  ------------------------------------------------------------------- Pulmonary artery:   The main pulmonary artery was normal-sized. Systolic pressure was mildly  increased.  ------------------------------------------------------------------- Right atrium:  The atrium was mildly dilated.  ------------------------------------------------------------------- Pericardium:  There was no pericardial effusion.  ------------------------------------------------------------------- Systemic veins: Inferior vena cava: The vessel was dilated. The respirophasic diameter changes were in the normal range (= 50%), consistent with normal central venous pressure.  ------------------------------------------------------------------- Measurements   Left ventricle                           Value        Reference  LV ID, ED, PLAX chordal  45.5  mm     43 - 52  LV ID, ES, PLAX chordal                  28.8  mm     23 - 38  LV fx shortening, PLAX chordal           37    %      >=29  LV PW thickness, ED                      12.1  mm     ---------  IVS/LV PW ratio, ED                      1.04         <=1.3  Stroke volume, 2D                        60    ml     ---------  Stroke volume/bsa, 2D                    25    ml/m^2 ---------  LV e&', lateral                           15    cm/s   ---------  LV E/e&', lateral                         5.3          ---------  LV e&', medial                            7.51  cm/s   ---------  LV E/e&', medial                          10.59        ---------  LV e&', average                           11.26 cm/s   ---------  LV E/e&', average                         7.06         ---------    Ventricular septum                       Value        Reference  IVS thickness, ED                        12.6  mm     ---------    LVOT                                     Value        Reference  LVOT ID, S                               20    mm     ---------  LVOT area  3.14  cm^2   ---------  LVOT ID                                  20    mm     ---------  LVOT peak velocity, S                     101   cm/s   ---------  LVOT mean velocity, S                    66.2  cm/s   ---------  LVOT VTI, S                              19.1  cm     ---------  LVOT peak gradient, S                    4     mm Hg  ---------  Stroke volume (SV), LVOT DP              60    ml     ---------  Stroke index (SV/bsa), LVOT DP           25.4  ml/m^2 ---------    Aortic valve                             Value        Reference  Aortic regurg pressure half-time         459   ms     ---------    Aorta                                    Value        Reference  Aortic root ID, ED                       38    mm     ---------  Ascending aorta ID, A-P, S               41    mm     ---------    Left atrium                              Value        Reference  LA ID, A-P, ES                           56    mm     ---------  LA ID/bsa, A-P                   (H)     2.37  cm/m^2 <=2.2  LA volume, S                             106   ml     ---------  LA volume/bsa, S  44.9  ml/m^2 ---------  LA volume, ES, 1-p A4C                   119   ml     ---------  LA volume/bsa, ES, 1-p A4C               50.4  ml/m^2 ---------  LA volume, ES, 1-p A2C                   86    ml     ---------  LA volume/bsa, ES, 1-p A2C               36.4  ml/m^2 ---------    Mitral valve                             Value        Reference  Mitral E-wave peak velocity              79.5  cm/s   ---------  Mitral A-wave peak velocity              51.3  cm/s   ---------  Mitral deceleration time         (H)     236   ms     150 - 230  Mitral peak gradient, D                  3     mm Hg  ---------  Mitral E/A ratio, peak                   1.5          ---------    Pulmonary arteries                       Value        Reference  PA pressure, S, DP               (H)     33    mm Hg  <=30    Tricuspid valve                          Value        Reference  Tricuspid regurg peak velocity           250   cm/s    ---------  Tricuspid peak RV-RA gradient            25    mm Hg  ---------    Systemic veins                           Value        Reference  Estimated CVP                            8     mm Hg  ---------    Right ventricle                          Value        Reference  RV pressure, S, DP               (H)     33  mm Hg  <=30  RV s&', lateral, S                        12.9  cm/s   ---------  Legend: (L)  and  (H)  mark values outside specified reference range.  ------------------------------------------------------------------- Prepared and Electronically Authenticated by  Candee Furbish, M.D. 2016-07-05T15:03:40  Aortic Size Index=     4.5    /Body surface area is 2.09 meters squared. =1.99  < 2.75 cm/m2      4% risk per year 2.75 to 4.25          8% risk per year > 4.25 cm/m2    20% risk per year   Assessment / Plan:   #1 4.4 cm ascending aortic aneurysm unchanged.  At this point I would not recommend any surgical intervention and recommended follow-up CTA in 1 year patient is wife are agreeable with this #2 aortic valve is a trileaflet valve with normal thickness no evidence of stenosis #3 chronic atrial fibrillation on Coumadin #4 cognitive memory problems #5 history of prostatic cancer with bony metastasis-currently controlled #6 unexplained weight loss being evaluated by GI and primary care    Grace Isaac MD      [address] [place] [address] Office [phone]   Beeper [phone]  06/03/2018 3:38 PM

## 2018-07-02 DIAGNOSIS — Z7901 Long term (current) use of anticoagulants: Secondary | ICD-10-CM | POA: Diagnosis not present

## 2018-07-02 DIAGNOSIS — E538 Deficiency of other specified B group vitamins: Secondary | ICD-10-CM | POA: Diagnosis not present

## 2018-07-19 DIAGNOSIS — I482 Chronic atrial fibrillation: Secondary | ICD-10-CM | POA: Diagnosis not present

## 2018-07-19 DIAGNOSIS — G309 Alzheimer's disease, unspecified: Secondary | ICD-10-CM | POA: Diagnosis not present

## 2018-07-19 DIAGNOSIS — M109 Gout, unspecified: Secondary | ICD-10-CM | POA: Diagnosis not present

## 2018-07-19 DIAGNOSIS — C61 Malignant neoplasm of prostate: Secondary | ICD-10-CM | POA: Diagnosis not present

## 2018-07-19 DIAGNOSIS — E538 Deficiency of other specified B group vitamins: Secondary | ICD-10-CM | POA: Diagnosis not present

## 2018-07-19 DIAGNOSIS — D696 Thrombocytopenia, unspecified: Secondary | ICD-10-CM | POA: Diagnosis not present

## 2018-07-19 DIAGNOSIS — E782 Mixed hyperlipidemia: Secondary | ICD-10-CM | POA: Diagnosis not present

## 2018-07-19 DIAGNOSIS — E559 Vitamin D deficiency, unspecified: Secondary | ICD-10-CM | POA: Diagnosis not present

## 2018-08-03 DIAGNOSIS — Z7901 Long term (current) use of anticoagulants: Secondary | ICD-10-CM | POA: Diagnosis not present

## 2018-08-03 DIAGNOSIS — Z23 Encounter for immunization: Secondary | ICD-10-CM | POA: Diagnosis not present

## 2018-08-17 ENCOUNTER — Encounter: Payer: Self-pay | Admitting: Interventional Cardiology

## 2018-08-25 DIAGNOSIS — M25572 Pain in left ankle and joints of left foot: Secondary | ICD-10-CM | POA: Diagnosis not present

## 2018-08-25 DIAGNOSIS — S93492A Sprain of other ligament of left ankle, initial encounter: Secondary | ICD-10-CM | POA: Diagnosis not present

## 2018-08-31 ENCOUNTER — Encounter: Payer: Self-pay | Admitting: Interventional Cardiology

## 2018-08-31 ENCOUNTER — Ambulatory Visit (INDEPENDENT_AMBULATORY_CARE_PROVIDER_SITE_OTHER): Payer: Medicare Other | Admitting: Interventional Cardiology

## 2018-08-31 VITALS — BP 106/74 | HR 63 | Ht 75.0 in | Wt 181.6 lb

## 2018-08-31 DIAGNOSIS — I482 Chronic atrial fibrillation, unspecified: Secondary | ICD-10-CM | POA: Diagnosis not present

## 2018-08-31 DIAGNOSIS — G301 Alzheimer's disease with late onset: Secondary | ICD-10-CM | POA: Diagnosis not present

## 2018-08-31 DIAGNOSIS — F0280 Dementia in other diseases classified elsewhere without behavioral disturbance: Secondary | ICD-10-CM

## 2018-08-31 DIAGNOSIS — J418 Mixed simple and mucopurulent chronic bronchitis: Secondary | ICD-10-CM | POA: Diagnosis not present

## 2018-08-31 DIAGNOSIS — Z7901 Long term (current) use of anticoagulants: Secondary | ICD-10-CM | POA: Diagnosis not present

## 2018-08-31 DIAGNOSIS — I712 Thoracic aortic aneurysm, without rupture: Secondary | ICD-10-CM | POA: Diagnosis not present

## 2018-08-31 DIAGNOSIS — D6851 Activated protein C resistance: Secondary | ICD-10-CM

## 2018-08-31 DIAGNOSIS — I5032 Chronic diastolic (congestive) heart failure: Secondary | ICD-10-CM | POA: Diagnosis not present

## 2018-08-31 DIAGNOSIS — I7121 Aneurysm of the ascending aorta, without rupture: Secondary | ICD-10-CM

## 2018-08-31 DIAGNOSIS — I25708 Atherosclerosis of coronary artery bypass graft(s), unspecified, with other forms of angina pectoris: Secondary | ICD-10-CM

## 2018-08-31 NOTE — Patient Instructions (Signed)
Medication Instructions:  Your physician recommends that you continue on your current medications as directed. Please refer to the Current Medication list given to you today.  If you need a refill on your cardiac medications before your next appointment, please call your pharmacy.   Lab work: None If you have labs (blood work) drawn today and your tests are completely normal, you will receive your results only by: Marland Kitchen MyChart Message (if you have MyChart) OR . A paper copy in the mail If you have any lab test that is abnormal or we need to change your treatment, we will call you to review the results.  Testing/Procedures: None  Follow-Up: At [facility], you and your health needs are our priority.  As part of our continuing mission to provide you with exceptional heart care, we have created designated Provider Care Teams.  These Care Teams include your primary Cardiologist (physician) and Advanced Practice Providers (APPs -  Physician Assistants and Nurse Practitioners) who all work together to provide you with the care you need, when you need it. You will need a follow up appointment in 1 years.  Please call our office 2 months in advance to schedule this appointment.  You may see Dr. Tamala Julian or one of the following Advanced Practice Providers on your designated Care Team:   Truitt Merle, NP Cecilie Kicks, NP . Kathyrn Drown, NP  Any Other Special Instructions Will Be Listed Below (If Applicable).

## 2018-08-31 NOTE — Progress Notes (Signed)
Cardiology Office Note:    Date:  08/31/2018   ID:  Jacob [name], DOB 1946-04-28, MRN [id number]  PCP:  Josetta Huddle, MD  Cardiologist:  No primary care provider on file.   Referring MD: Josetta Huddle, MD   Chief Complaint  Patient presents with  . Atrial Fibrillation  . Coronary Artery Disease  . Thoracic Aortic Aneurysm    History of Present Illness:    Jacob [name] is a 71 y.o. male with a hx of chronic atrial fibrillation, chronic anticoagulation, coronary artery disease with remote coronary bypass grafting 2002, ascending aortic aneurysm, hypertension, and COPD   Jacob [name] is accompanied by his wife.  He reminds me that he has Alzheimer's.  He complains that he has buttocks discomfort because of muscle wasting and feels that the skin is just drooping without muscle to support it.  This seems to be a preoccupation in the conversation today.  There is no bleeding or skin breakdown.  He denies orthopnea, PND, angina, syncope, edema, and PND.  He has lost weight.  Blood pressure has subsequently decreased.  He is compliant with current medical regimen.    Past Medical History:  Diagnosis Date  . Adrenal adenoma    bilateral  . Ascending aortic aneurysm ([place])    monitored by dr dr Daneen Schick-  stable 4.4cm x 4.3cm per CT 06-01-2014  . At risk for sleep apnea    STOP-BANG= 6     SENT TO PCP 03-05-2015  . Borderline diabetes mellitus   . Chronic anticoagulation   . Chronic atrial fibrillation   . Chronic diastolic heart failure ([place])   . Chronic idiopathic constipation   . Coronary artery disease    cardiologist-  dr Daneen Schick--  s/p  CABG x2 2000/   last myoview 2012-- normal  . Diabetes mellitus without complication ([place])   . Factor 5 Leiden mutation, heterozygous ([place])   . Gross hematuria   . History of adenomatous polyp of colon    2009  . History of basal cell carcinoma excision    forehead  . History of DVT of lower extremity    x2 1980's  . History  of gout   . History of prostate cancer oncologist --dr Rafael Bihari [facility]--  no recurrence since 2013   dx 2004--  s/p  radical retropubic prostatectomy/  recurrence 2006  and 2013 s/p radiation at [facility]  and completed Hormone tx  . Hx of radiation therapy    external beam proton radiation 2006  . Hypertension   . Left ureteral stone   . Multiple thyroid nodules    Benign bx done July 2014  . OA (osteoarthritis)   . Radiation proctitis   . Recovering alcoholic in remission ([place])   . S/P CABG x 2    09-17-1999  . Secondary pulmonary hypertension     Past Surgical History:  Procedure Laterality Date  . CARDIAC CATHETERIZATION  09-03-1999  dr Daneen Schick   Abnormal myoview/  80% mLAD,  total occlusion pRCA , collaterals from pRCA segment and left CFX and LAD,  OM1 sig. lesion,  total occlusion very small OM2,  persived LVSF,  elevated end-diastolic pressure  . CARDIOVASCULAR STRESS TEST  03-04-2011   dr Daneen Schick   normal perfusion study/  attenuation artifact in inferior region of myocardium,  no ischemia or infarct/scar,  unable to gate wall motion due to irregular rhythm  . COLONOSCOPY WITH PROPOFOL [name] 12/27/2013   Procedure: COLONOSCOPY  WITH PROPOFOL;  Surgeon: Garlan Fair, MD;  Location: [facility];  Service: Endoscopy;  Laterality: [name];  . CORONARY ARTERY BYPASS GRAFT  09-17-1999   dr gerhardt   x2 vessel--  LIMA to LAD and RCA  . CYSTOSCOPY WITH RETROGRADE PYELOGRAM, URETEROSCOPY AND STENT PLACEMENT Left 03/07/2015   Procedure: CYSTOSCOPY WITH RETROGRADE PYELOGRAM, URETEROSCOPY  WITH STONE EXTRACTION AND STENT PLACEMENT;  Surgeon: Rana Snare, MD;  [facility]  Service: Urology;  Laterality: Left;  . EXCISIONAL BX OF MASS, LEFT DISTAL BRACHIUM  08-09-2002  . HEMORRHOID SURGERY  2000  . HOLMIUM LASER APPLICATION Left 6/78/9381   Procedure: HOLMIUM LASER APPLICATION;  Surgeon: Rana Snare, MD;  Location: [facility];   Service: Urology;  Laterality: Left;  . KNEE SURGERY    . LUMBAR DISKECOTMY AND FUSION  1972   L4 -- L5  . RADICAL RETROPUBIC PROSTATECTOMY  02-20-2003  . TONSILLECTOMY  as child  . TOTAL HIP ARTHROPLASTY Left 01/18/2013   Procedure: TOTAL HIP ARTHROPLASTY;  Surgeon: Garald Balding, MD;  Location: [facility];  Service: Orthopedics;  Laterality: Left;  Left Total Hip Arthroplasty  . TRANSTHORACIC ECHOCARDIOGRAM  05-10-2013  dr Daneen Schick   mild LVH/  55-60%/  severe LAE/  mild to moderate MR and TR/  mild RAE/  moderate to severe RV elevated systolic pressure/  IVC demonstates <50% collapse w/ respiration/  due to atrial fib. unable evaluate diastolic function    Current Medications: Current Meds  Medication Sig  . carvedilol (COREG) 3.125 MG tablet Take 1 tablet (3.125 mg total) by mouth 2 (two) times daily with a meal.  . Cholecalciferol (VITAMIN D3) 2000 units TABS Take 1 tablet by mouth daily.   . colchicine 0.6 MG tablet Take 0.6 mg by mouth daily.   . Cyanocobalamin (B-12 COMPLIANCE INJECTION IJ) Inject as directed every 30 (thirty) days.   Marland Kitchen dutasteride (AVODART) 0.5 MG capsule Take 0.5 mg by mouth daily.  Marland Kitchen escitalopram (LEXAPRO) 10 MG tablet Take 10 mg by mouth every evening.   . folic acid (FOLVITE) 1 MG tablet Take 1 mg by mouth daily.  . furosemide (LASIX) 40 MG tablet Take 40 mg by mouth daily.  . Melatonin 3-2 MG TABS Take 3 mg by mouth at bedtime.  . memantine (NAMENDA) 10 MG tablet Take 1 tablet (10 mg total) by mouth 2 (two) times daily.  . Multiple Vitamins-Minerals (PRESERVISION AREDS) TABS Take 2 tablets by mouth daily.  . ramipril (ALTACE) 2.5 MG capsule Take 2.5 mg by mouth daily.  Marland Kitchen warfarin (COUMADIN) 5 MG tablet Take 1-1.5 tablets (5-7.5 mg total) by mouth daily. Take 1 tablet daily except on Sundays take 1 1/2 tablet (7.5mg )     Allergies:   Percocet [oxycodone-acetaminophen]; Allopurinol; Ciprofloxacin; Crestor [rosuvastatin calcium]; and Dilaudid [hydromorphone  hcl]   Social History   Socioeconomic History  . Marital status: Married    Spouse name: Not on file  . Number of children: Not on file  . Years of education: Not on file  . Highest education level: Not on file  Occupational History  . Occupation: retired Forensic psychologist  Social Needs  . Financial resource strain: Not on file  . Food insecurity:    Worry: Not on file    Inability: Not on file  . Transportation needs:    Medical: Not on file    Non-medical: Not on file  Tobacco Use  . Smoking status: Former Smoker    Packs/day: 1.00  Years: 15.00    Pack years: 15.00    Last attempt to quit: 11/24/1985    Years since quitting: 32.7  . Smokeless tobacco: Never Used  Substance and Sexual Activity  . Alcohol use: No    Comment: Recovery alcoholic --  attends AA  . Drug use: No  . Sexual activity: Not on file  Lifestyle  . Physical activity:    Days per week: Not on file    Minutes per session: Not on file  . Stress: Not on file  Relationships  . Social connections:    Talks on phone: Not on file    Gets together: Not on file    Attends religious service: Not on file    Active member of club or organization: Not on file    Attends meetings of clubs or organizations: Not on file    Relationship status: Not on file  Other Topics Concern  . Not on file  Social History Narrative  . Not on file     Family History: The patient's family history includes CAD in his mother; Cancer in his mother; Heart attack in his father and mother; Heart failure in his father; Thyroid disease in his father; Valvular heart disease in his father.  ROS:   Please see the history of present illness.    He complains of unexpected weight loss.  Loss of muscle mass in his buttocks.  All other systems reviewed and are negative.  EKGs/Labs/Other Studies Reviewed:    The following studies were reviewed today:   CT scan of the chest with contrast July 2019: IMPRESSION: Aneurysmal dilatation of the  ascending aorta is not significantly changed at 4.2 cm.   Aortic Atherosclerosis (ICD10-I70.0).   EKG:  EKG is  ordered today.  The ekg ordered today demonstrates atrial fibrillation with ventricular response at 63 bpm.  Nonspecific T wave flattening.  No significant change when compared to the prior tracing performed In March 2018.  Recent Labs: 04/22/2018: ALT 15; BUN 20; Creatinine, Ser 1.06; Potassium 3.9; Sodium 142  Recent Lipid Panel No results found for: CHOL, TRIG, HDL, CHOLHDL, VLDL, LDLCALC, LDLDIRECT  Physical Exam:    VS:  BP 106/74   Pulse 63   Ht 6\' 3"  (1.905 m)   Wt 181 lb 9.6 oz (82.4 kg)   BMI 22.70 kg/m      Wt Readings from Last 3 Encounters:  08/31/18 181 lb 9.6 oz (82.4 kg)  06/03/18 185 lb (83.9 kg)  04/22/18 189 lb (85.7 kg)     GEN: Slender/somewhat frail appearing in no acute distress HEENT: Normal NECK: No JVD. LYMPHATICS: No lymphadenopathy CARDIAC: Irregularly irregular RR, no murmur, no gallop, no edema. VASCULAR: 2+ bilateral radial and posterior tibial bilateral pulses.  No bruits. RESPIRATORY:  Clear to auscultation without rales, wheezing or rhonchi  ABDOMEN: Soft, non-tender, non-distended, No pulsatile mass, MUSCULOSKELETAL: No deformity  SKIN: Warm and dry NEUROLOGIC:  Alert and oriented x 3 PSYCHIATRIC:  Normal affect   ASSESSMENT:    1. Coronary artery disease of bypass graft of native heart with stable angina pectoris ([place])   2. Chronic atrial fibrillation   3. Chronic diastolic heart failure ([place])   4. Factor 5 Leiden mutation, heterozygous ([place])   5. Late onset Alzheimer's disease without behavioral disturbance ([place])   6. Ascending aortic aneurysm ([place])   7. Mixed simple and mucopurulent chronic bronchitis ([facility])    PLAN:    In order of problems listed above:  1. Stable  without angina.  Bypass surgery completed in 2000.  In absence of ischemic symptoms no nuclear or functional testing is indicated at this time. 2. Chronic  atrial fibrillation with controlled rate.  No change in therapy is indicated. 3. No evidence of volume overload. 4. Chronic anticoagulation therapy with Coumadin.  Followed in Coumadin clinic at [facility]. 5. Not discussed but clearly present. 6. Stable aortic root size.  Followed by Dr. Servando Snare.  Mention to the family perhaps decreasing frequency of CT scan of the will leave this to Dr. Servando Snare.  Clinical follow-up with me in 1 year.  No functional testing at this time.    Medication Adjustments/Labs and Tests Ordered: Current medicines are reviewed at length with the patient today.  Concerns regarding medicines are outlined above.  Orders Placed This Encounter  Procedures  . EKG 12-Lead   No orders of the defined types were placed in this encounter.   Patient Instructions  Medication Instructions:  Your physician recommends that you continue on your current medications as directed. Please refer to the Current Medication list given to you today.  If you need a refill on your cardiac medications before your next appointment, please call your pharmacy.   Lab work: None If you have labs (blood work) drawn today and your tests are completely normal, you will receive your results only by: Marland Kitchen MyChart Message (if you have MyChart) OR . A paper copy in the mail If you have any lab test that is abnormal or we need to change your treatment, we will call you to review the results.  Testing/Procedures: None  Follow-Up: At [facility], you and your health needs are our priority.  As part of our continuing mission to provide you with exceptional heart care, we have created designated Provider Care Teams.  These Care Teams include your primary Cardiologist (physician) and Advanced Practice Providers (APPs -  Physician Assistants and Nurse Practitioners) who all work together to provide you with the care you need, when you need it. You will need a follow up appointment in 1 years.  Please call  our office 2 months in advance to schedule this appointment.  You may see Dr. Tamala Julian or one of the following Advanced Practice Providers on your designated Care Team:   Truitt Merle, NP Cecilie Kicks, NP . Kathyrn Drown, NP  Any Other Special Instructions Will Be Listed Below (If Applicable).         Signed, Sinclair Grooms, MD  08/31/2018 3:36 PM    [place]

## 2018-09-03 DIAGNOSIS — E538 Deficiency of other specified B group vitamins: Secondary | ICD-10-CM | POA: Diagnosis not present

## 2018-09-07 DIAGNOSIS — M25572 Pain in left ankle and joints of left foot: Secondary | ICD-10-CM | POA: Diagnosis not present

## 2018-09-13 ENCOUNTER — Other Ambulatory Visit: Payer: Self-pay | Admitting: Interventional Cardiology

## 2018-09-22 DIAGNOSIS — M25572 Pain in left ankle and joints of left foot: Secondary | ICD-10-CM | POA: Diagnosis not present

## 2018-09-29 DIAGNOSIS — I2510 Atherosclerotic heart disease of native coronary artery without angina pectoris: Secondary | ICD-10-CM | POA: Diagnosis not present

## 2018-09-29 DIAGNOSIS — I482 Chronic atrial fibrillation, unspecified: Secondary | ICD-10-CM | POA: Diagnosis not present

## 2018-09-29 DIAGNOSIS — Z8546 Personal history of malignant neoplasm of prostate: Secondary | ICD-10-CM | POA: Diagnosis not present

## 2018-09-29 DIAGNOSIS — F341 Dysthymic disorder: Secondary | ICD-10-CM | POA: Diagnosis not present

## 2018-09-29 DIAGNOSIS — G309 Alzheimer's disease, unspecified: Secondary | ICD-10-CM | POA: Diagnosis not present

## 2018-09-29 DIAGNOSIS — D649 Anemia, unspecified: Secondary | ICD-10-CM | POA: Diagnosis not present

## 2018-09-29 DIAGNOSIS — E782 Mixed hyperlipidemia: Secondary | ICD-10-CM | POA: Diagnosis not present

## 2018-09-29 DIAGNOSIS — E785 Hyperlipidemia, unspecified: Secondary | ICD-10-CM | POA: Diagnosis not present

## 2018-09-29 DIAGNOSIS — I5032 Chronic diastolic (congestive) heart failure: Secondary | ICD-10-CM | POA: Diagnosis not present

## 2018-09-29 DIAGNOSIS — I10 Essential (primary) hypertension: Secondary | ICD-10-CM | POA: Diagnosis not present

## 2018-09-29 DIAGNOSIS — C61 Malignant neoplasm of prostate: Secondary | ICD-10-CM | POA: Diagnosis not present

## 2018-10-07 ENCOUNTER — Encounter: Payer: Self-pay | Admitting: Neurology

## 2018-10-07 ENCOUNTER — Ambulatory Visit (INDEPENDENT_AMBULATORY_CARE_PROVIDER_SITE_OTHER): Payer: Medicare Other | Admitting: Neurology

## 2018-10-07 VITALS — BP 120/79 | HR 73 | Ht 75.0 in | Wt 179.0 lb

## 2018-10-07 DIAGNOSIS — G301 Alzheimer's disease with late onset: Secondary | ICD-10-CM | POA: Diagnosis not present

## 2018-10-07 DIAGNOSIS — F0280 Dementia in other diseases classified elsewhere without behavioral disturbance: Secondary | ICD-10-CM

## 2018-10-07 DIAGNOSIS — I25708 Atherosclerosis of coronary artery bypass graft(s), unspecified, with other forms of angina pectoris: Secondary | ICD-10-CM | POA: Diagnosis not present

## 2018-10-07 MED ORDER — ESCITALOPRAM OXALATE 10 MG PO TABS: 15.0000 mg | ORAL_TABLET | Freq: Every day | ORAL | 2 refills | Status: DC

## 2018-10-07 NOTE — Patient Instructions (Signed)
Alzheimer Disease Caregiver Guide A person who has Alzheimer disease may not be able to take care of himself or herself. He or she may need help with simple tasks. The tips below can help you care for the person. Memory loss and confusion If the person is confused or cannot remember things:  Stay calm.  Respond with a short answer.  Avoid correcting him or her in a way that sounds like scolding.  Try not to take it personally, even if he or she forgets your name.  Behavior changes The person may go through behavior changes. This can include depression, anxiety, anger, or seeing things that are not there. When behavior changes:  Try not to take behavior changes personally.  Stay calm and patient.  Do not argue or try to convince the person about a specific point.  Know that these changes are part of the disease process. Try to work through it.  Tips to lessen frustration  Make appointments and do daily tasks when the person is at his or her best.  Take your time. Simple tasks may take longer. Allow plenty of time to complete tasks.  Limit choices for the person.  Involve the person in what you are doing.  Stick to a routine.  Avoid new or crowded places, if possible.  Use simple words, short sentences, and a calm voice. Only give 1 direction at a time.  Buy clothes and shoes that are easy to put on and take off.  Let people help if they offer. Home safety  Keep floors clear. Remove rugs, magazine racks, and floor lamps.  Keep hallways well lit.  Put a handrail and nonslip mat in the bathtub or shower.  Put childproof locks on cabinets that have dangerous items in them. These items include medicine, alcohol, guns, toxic cleaning items, sharp tools, matches, or lighters.  Place locks on doors where the person cannot see or reach them. This helps the person to not wander out of the house and get lost.  Be prepared for emergencies. Keep a list of emergency phone  numbers and addresses in a handy area. Plans for the future  Talk about finances. ? Talk about money management. People with Alzheimer disease have trouble managing their money as the disease gets worse. ? Get help from professional advisors about financial and legal matters.  Talk about future care. ? Choose a power of attorney. This is someone who can make decisions for the person with Alzheimer disease when he or she can no longer do so. ? Talk about driving and when it is the right time to stop. The person's doctor can help with this. ? If the person lives alone, make sure he or she is safe. Some people need extra help at home. Other people need more care at a nursing home or care center. Support groups Some benefits of joining a support group include:  Learning ways to manage stress.  Sharing experiences with others.  Getting emotional comfort and support.  Learning new caregiving skills as the disease progresses.  Knowing what community resources are available and taking advantage of them.  Get help if:  The person has a fever.  The person has a sudden behavior change that does not get better with calming strategies.  The person is unable to manage his or her living situation.  The person threatens you or anyone else, including himself or herself.  You are no longer able to care for the person. This information is not   intended to replace advice given to you by your health care provider. Make sure you discuss any questions you have with your health care provider. Document Released: 02/02/2012 Document Revised: 04/17/2016 Document Reviewed: 12/31/2011 Elsevier Interactive Patient Education  2017 Elsevier Inc.  

## 2018-10-07 NOTE — Progress Notes (Signed)
[facility]   Provider:  Larey Seat, M D  Referring Provider: Josetta Huddle, MD Primary Care Physician:  Josetta Huddle, MD  Chief Complaint  Patient presents with  . Follow-up    pt with wife, rm 11 pt states no concerns things are ok, wife mentioned he has still had increase in weightloss but appetite is about the same.     HPI:  Jacob [name] is a 72 y.o. male patient seen in a RV on  10-07-2018 .  He continues to lose weight and muscle mass, has extremely thin extremities.  He is eating, has a good appetite, high caloric food, healthy foods, protein.  He is cleared by abdominal CT for any malignancy, especially pancreatic.  Cognitive decline is still progressive - lowest MMSE score to date.  Is his is the dementia part of the weight loss ?   MMSE - Mini Mental State Exam 10/07/2018 04/20/2018 10/20/2017 04/14/2017 10/22/2016 09/16/2016 08/11/2016  Orientation to time 0 1 0 2 3 0 4  Orientation to Place 2 2 3 4 4 4 2   Registration 3 3 3 3 3 3 3   Attention/ Calculation 0 1 1 0 2 1 2   Attention/Calculation-comments - no numbers, only D in the word backwards - - - - -  Recall 0 1 1 2 1 1  0  Language- name 2 objects 1 0 1 2 2 2 2   Language- repeat 1 1 1 1 1 1 1   Language- follow 3 step command 2 3 2 3 3 2 3   Language- read & follow direction 0 0 1 1 1 1 1   Write a sentence 0 0 1 1 1 1 1   Copy design 0 1 1 0 1 1 0  Total score 9 13 15 19 22 17 19          Seen here as a referral  from [name] for an evaluation of neurocognitive decline.Mr. 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 He did have a borderline low vitamin B12 level in May of this year, which has been addressed and supplemented by his primary care physician, Dr. Josetta Huddle. The patient has a history of atrial fibrillation with an INR goal of 2-3, has no history of bleeding and has no history of dietary  indiscretions with vitamin K noncompliance with medication. He was tested by [name] in early spring of this year, after having shown signs of memory loss with a slow progression over the last couple of years. I do not have the original results from [name] and available here, but the patient indicated that he became increasingly fatigued and somewhat irritated at the length of the testing.The patient saw 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 Over the past 2 years he has noted word finding difficulties to increase. He retired in 2015 and his wife had noticed and gave examples of him not being able to sometimes follow simple directions, sometimes forgetting parts of conversations from the previous day and misplacing or losing personal items. 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  His past medical history is extensive ; for 17 years his ascending aortic aneurysm has been followed, but recently an enlargement was noticed.  chronic atrial fibrillation, COPD, coronary artery disease with CABG 2 in 2010, history of factor V Leiden deficiency, gouty arthritis, hypercholesterolemia, hyperglycemia, hypertension. He had external beam proton radiation in 2006 prostatectomy 2004 reoccurrences in 2006 at 2013. He has no history of head injury, stroke like symptoms seizures or neurologic infections. I was quite surprised after today's introductory interview with the patient that he would have scored at the lowest percentile. He was apparently unable to complete some of the Tests and subsections of  tests. I do wonder if this was #1 bad day and what I see today is a very good day, or if this is actually really a sign off his increasing agitation and indications considering the length of the testing.  09-16-2016 My diagnosis is that of a progressive memory loss disorder, which also is indicated by the MRI report, showing perisylvian and moderate mesial temporal atrophy. There is some D.O. service. This is not unilateral but bilaterally fairly symmetric and I suspect that I cannot attribute this to the proton deep therapy he received. My colleague did not make it comment about the shape of the cerebellopontine but should be influenced if dementia is induced by alcohol abuse. I would also like to say that the EEG was a normal base trunk rhythm which very much surprised. I would like to treat Mr. Tangen is an Alzheimer patient but there is no indication of a vascular dementia. Aricept increased to 10 mg daily. and Namenda titrationpack ( Dr Marcellus Scott ) .   10-22-2016 Mr. Whistler a myeloid PET brain scan was performed on 10/07/2016 and was positive for brain amyloid, presence of moderate neuritic plaques in the brain. A positive scan indicates the presence of Alzheimer's disease. The patient performed today very well on a Mini-Mental Status Examination was 22 out of 30 points. His troubles were in serial 7 calculation and short-term memory but he was oriented to month day and season in place, and he could follow multistep commands. He has not gotten lost, but prefers his wife to drive him. He does not drive at night, in rain and not on highway. She drives him for several years when a trip goes onto the high way.   Interval history from 04/14/2017. I have the pleasure of seeing Mr. Mrs. Nouri today for revisit. They had a good holiday season and recently hosted a law school reunion. They report that it was great fun. He has more problems finding words. 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 Since the patient could not tolerate Aricept he was only treated with Namenda. He seems to tolerate this very well.  Interval history from 20 October 2017, Mr. Mrs. Ledgerwood here today, this is a routine revisit to follow the patient's memory and cognitive status.  The patient retired from [employer], he continues to be treated with [employer], he is doing well with word fluency. Today's Mini-Mental status examination scored 15 out of 30 points which is a progression since last years test.  He denies any difficulties with ADL. He recovering from Cataract surgery- has  noticed vision improvement. He wants to drive, but his wife doesn't let him.    04-20-2018,   Wife reports chronic constipation- but no  change in appetite,not forgetting any meals- but involuntary Weight loss, about 35 pounds. He looks rather sickly today, not as groomed, an he ost muscle mass. He gave up all driving. Progressive dementia, not depressed, neither infected . A upper Gi test was normal.  He had a similar weight loss of 25 pounds after a hip surgery, unknown reason- his doctors at [facility] believed this was related to chemo and radiation," hormone therapy- stress on his body ". His MMSE was only 13-30 points, lowest score.  I am worried that he may have a pancreatic illness. Sending notes to Dr Hilarie Fredrickson.     Review of Systems: Out of a complete 14 system review, the patient complains of only the following symptoms, and all other reviewed systems are negative. Forgetfulness.    MMSE - Mini Mental State Exam 10/07/2018 04/20/2018 10/20/2017 04/14/2017 10/22/2016 09/16/2016 08/11/2016  Orientation to time 0 1 0 2 3 0 4  Orientation to Place 2 2 3 4 4 4 2   Registration 3 3 3 3 3 3 3   Attention/ Calculation 0 1 1 0 2 1 2   Attention/Calculation-comments - no numbers, only D in the word backwards - - - - -  Recall 0 1 1 2 1 1  0  Language- name 2 objects 1 0 1 2 2 2 2     Language- repeat 1 1 1 1 1 1 1   Language- follow 3 step command 2 3 2 3 3 2 3   Language- read & follow direction 0 0 1 1 1 1 1   Write a sentence 0 0 1 1 1 1 1   Copy design 0 1 1 0 1 1 0  Total score 9 13 15 19 22 17 19      Family History  Problem Relation Age of Onset  . Cancer Mother   . CAD Mother   . Heart attack Mother   . Heart attack Father   . Valvular heart disease Father   . Thyroid disease Father   . Heart failure Father     Past Medical History:  Diagnosis Date  . Adrenal adenoma    bilateral  . Ascending aortic aneurysm ([facility] [place])    monitored by dr dr Daneen Schick-  stable 4.4cm x 4.3cm per CT 06-01-2014  . At risk for sleep apnea    STOP-BANG= 6     SENT TO PCP 03-05-2015  . Borderline diabetes mellitus   . Chronic anticoagulation   . Chronic atrial fibrillation   . Chronic diastolic heart failure ([place])   . Chronic idiopathic constipation   . Coronary artery disease    cardiologist-  dr Daneen Schick--  s/p  CABG x2 2000/   last myoview 2012-- normal  . Diabetes mellitus without complication ([place])   . Factor 5 Leiden mutation, heterozygous ([place])   . Gross hematuria   . History of adenomatous polyp of colon    2009  . History of basal cell carcinoma excision    forehead  . History of DVT of lower extremity    x2 1980's  . History of gout   . History of prostate cancer oncologist --dr Rafael Bihari [facility]--  no recurrence since 2013   dx 2004--  s/p  radical retropubic prostatectomy/  recurrence 2006  and 2013 s/p radiation at [facility]  and completed Hormone tx  . Hx of radiation therapy    external beam proton radiation 2006  . Hypertension   . Left ureteral stone   . Multiple  thyroid nodules    Benign bx done July 2014  . OA (osteoarthritis)   . Radiation proctitis   . Recovering alcoholic in remission ([place])   . S/P CABG x 2    09-17-1999  . Secondary pulmonary hypertension     Past Surgical History:  Procedure Laterality Date  . CARDIAC  CATHETERIZATION  09-03-1999  dr Daneen Schick   Abnormal myoview/  80% mLAD,  total occlusion pRCA , collaterals from pRCA segment and left CFX and LAD,  OM1 sig. lesion,  total occlusion very small OM2,  persived LVSF,  elevated end-diastolic pressure  . CARDIOVASCULAR STRESS TEST  03-04-2011   dr Daneen Schick   normal perfusion study/  attenuation artifact in inferior region of myocardium,  no ischemia or infarct/scar,  unable to gate wall motion due to irregular rhythm  . COLONOSCOPY WITH PROPOFOL [name] 12/27/2013   Procedure: COLONOSCOPY WITH PROPOFOL;  Surgeon: Garlan Fair, MD;  Location: [facility];  Service: Endoscopy;  Laterality: [name];  . CORONARY ARTERY BYPASS GRAFT  09-17-1999   dr gerhardt   x2 vessel--  LIMA to LAD and RCA  . CYSTOSCOPY WITH RETROGRADE PYELOGRAM, URETEROSCOPY AND STENT PLACEMENT Left 03/07/2015   Procedure: CYSTOSCOPY WITH RETROGRADE PYELOGRAM, URETEROSCOPY  WITH STONE EXTRACTION AND STENT PLACEMENT;  Surgeon: Rana Snare, MD;  Location[facility];  Service: Urology;  Laterality: Left;  . EXCISIONAL BX OF MASS, LEFT DISTAL BRACHIUM  08-09-2002  . HEMORRHOID SURGERY  2000  . HOLMIUM LASER APPLICATION Left 1/61/0960   Procedure: HOLMIUM LASER APPLICATION;  Surgeon: Rana Snare, MD;  [facility];  Service: Urology;  Laterality: Left;  . KNEE SURGERY    . LUMBAR DISKECOTMY AND FUSION  1972   L4 -- L5  . RADICAL RETROPUBIC PROSTATECTOMY  02-20-2003  . TONSILLECTOMY  as child  . TOTAL HIP ARTHROPLASTY Left 01/18/2013   Procedure: TOTAL HIP ARTHROPLASTY;  Surgeon: Garald Balding, MD;  Location: [facility];  Service: Orthopedics;  Laterality: Left;  Left Total Hip Arthroplasty  . TRANSTHORACIC ECHOCARDIOGRAM  05-10-2013  dr Daneen Schick   mild LVH/  55-60%/  severe LAE/  mild to moderate MR and TR/  mild RAE/  moderate to severe RV elevated systolic pressure/  IVC demonstates <50% collapse w/ respiration/  due to atrial fib. unable  evaluate diastolic function    Current Outpatient Medications  Medication Sig Dispense Refill  . carvedilol (COREG) 3.125 MG tablet TAKE 1 TABLET BY MOUTH TWICE A DAY WITH A MEAL 180 tablet 3  . Cholecalciferol (VITAMIN D3) 2000 units TABS Take 1 tablet by mouth daily.     . colchicine 0.6 MG tablet Take 0.6 mg by mouth daily.     . Cyanocobalamin (B-12 COMPLIANCE INJECTION IJ) Inject as directed every 30 (thirty) days.     Marland Kitchen dutasteride (AVODART) 0.5 MG capsule Take 0.5 mg by mouth daily.    Marland Kitchen escitalopram (LEXAPRO) 10 MG tablet Take 10 mg by mouth every evening.     . folic acid (FOLVITE) 1 MG tablet Take 1 mg by mouth daily.    . furosemide (LASIX) 40 MG tablet Take 40 mg by mouth daily.    . Melatonin 3-2 MG TABS Take 3 mg by mouth at bedtime.    . memantine (NAMENDA) 10 MG tablet Take 1 tablet (10 mg total) by mouth 2 (two) times daily. 180 tablet 3  . Multiple Vitamins-Minerals (PRESERVISION AREDS) TABS Take 2 tablets by mouth daily.    Marland Kitchen  ramipril (ALTACE) 2.5 MG capsule Take 2.5 mg by mouth daily.    Marland Kitchen warfarin (COUMADIN) 5 MG tablet Take 1-1.5 tablets (5-7.5 mg total) by mouth daily. Take 1 tablet daily except on Sundays take 1 1/2 tablet (7.5mg )     No current facility-administered medications for this visit.     Allergies as of 10/07/2018 - Review Complete 10/07/2018  Allergen Reaction Noted  . Percocet [oxycodone-acetaminophen] Other (See Comments) 01/13/2013  . Allopurinol Other (See Comments) 05/25/2014  . Ciprofloxacin  10/20/2017  . Crestor [rosuvastatin calcium]  06/12/2017  . Dilaudid [hydromorphone hcl] Other (See Comments) 12/08/2013    Vitals: BP 120/79   Pulse 73   Ht 6\' 3"  (1.905 m)   Wt 179 lb (81.2 kg)   BMI 22.37 kg/m  Last Weight:  Wt Readings from Last 1 Encounters:  10/07/18 179 lb (81.2 kg)   KPT:WSFK mass index is 22.37 kg/m.     Last Height:   Ht Readings from Last 1 Encounters:  10/07/18 6\' 3"  (1.905 m)    Physical exam:  General: The  patient is awake, alert and appears not in acute distress. The patient is well groomed. Head: Normocephalic, atraumatic. Neck is supple. Mallampati 3  neck circumference:17,  Nasal airflow patent , TMJ click evident . Retrognathia is not seen.  Severe neck stiffness.  Cardiovascular:  Regular rate and rhythm, without  murmurs or carotid bruit, and without distended neck veins. Respiratory: Lungs are clear to auscultation. Skin:  Without evidence of edema, or rash Trunk:  The patient's posture is erect.   Neurologic exam :The patient is awake and alert, oriented to place and time.  Memory subjective  described as impaired;  He does best in the morning. He still recalls names.   MMSE - Mini Mental State Exam 10/07/2018 04/20/2018 10/20/2017  Orientation to time 0 1 0  Orientation to Place 2 2 3   Registration 3 3 3   Attention/ Calculation 0 1 1  Attention/Calculation-comments - no numbers, only D in the word backwards -  Recall 0 1 1  Language- name 2 objects 1 0 1  Language- repeat 1 1 1   Language- follow 3 step command 2 3 2   Language- read & follow direction 0 0 1  Write a sentence 0 0 1  Copy design 0 1 1  Total score 9 13 15    CLINICAL DATA:  Declining memory function. Evaluate for Alzheimer's type dementia.  EXAM: NM PET METABOLIC BRAIN  TECHNIQUE: 10.9 mCi F-18 Florbetapir was injected intravenously. Full-ring PET imaging was performed from the vertex to the skull base. CT data was obtained and used for attenuation correction and anatomic localization.  COMPARISON:  Brain MRI 08/26/2016  Findings: There is increased florbetapir uptake seen in the cortical cerebral gray matter of the temporal, frontal, and parietal lobes, with these regions showing loss of the normal gray-white contrast. There is prominent demonstration of loss of gray-white differentiation in the paramedian parietal lobes. Additional prominent loss of gray-white differentiation in the temporal  lobes and RIGHT frontal lobe. Areas of Preserved white matter and gray matter differentiation in the occipital lobes. The cerebellum has no evidence of abnormal uptake with normal gray-white differentiation.  CT (for attenuation): Moderate atrophy.  No acute findings  FINDINGS: IMPRESSION:  The scan is POSITIVE for brain amyloid and is most consistent with the presence of moderate to frequent neuritic beta-amyloid plaques in the brain.  NOTE: Florbetapir is a radiopharmaceutical indicated for Positron Emission Tomography (PET) imaging of  beta-amyloid neuritic plaques in the brains of adult patients with cognitive impairment being evaluated for suspected Alzheimer's disease (AD). A positive scan indicates moderate to frequent plaques, which demonstrates the presence of AD pathology. A negative scan indicates sparse or no plaques, which is inconsistent with a diagnosis of AD. Florbetapir in the is an adjunct to other diagnostic evaluations.   Electronically Signed   By: Suzy Bouchard M.D.   On: 10/07/2016 16:36      Attention span & concentration ability appears normal in casual conversation, he can no longer change to the [place] language as he used to.  Speech , hoarse-dysphonia and some aphasia. Some stuttering.  Mood and affect are appropriate- he defers answers to all questions to his wife. .  Cranial nerves: taste and smell reportedly intact.  Pupils are equal and briskly reactive to light.  Hearing to finger rub intact.   Facial sensation intact to fine touch. Facial motor strength is symmetric and tongue and uvula move midline. Shoulder shrug was symmetrical.  Motor exam: Normal tone is normal - muscle bulk is decreasing , but symmetric strength in all extremities Gait and station: Patient walks without assistive device . He is a little stooped now- new . Strength within normal limits. Stance is stable and normal. Tandem gait remains  fragmented.  His Gait is  still unsteady and ataxic. Turns either way with 4 Steps.    I spent more than 25  minutes of face to face time with the patient and his wife. Greater than 50% of time was spent in counseling and coordination of care. We have discussed the diagnosis and differential and I answered the patient's questions.    1)  Severe memory decline, now raeching a MMSE of 9 points. 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  Slowed EEG.  All confirms Alzheimer's dementia, advanced.  Status post brain radiation for cancer in the past.   no longer driving.  2) unintended weight loss,   Bradycardia exacerbated - cannot take Aricept!!!! Namenda dose 10 mg bid. from today on.  Increased Lexapro to 15 mg daily.    Stay physically active, get enough sleep, plan cognitive challenging activities for the morning and physical activities for the afternoons. Moderate and well-balanced diet, since he has a good appetite. .  Rv with Np or MD in 6 month.   Asencion Partridge Glorene Leitzke MD  10/07/2018   CC: Josetta Huddle, Md [address] [place], [place] [address]

## 2018-10-08 DIAGNOSIS — Z7901 Long term (current) use of anticoagulants: Secondary | ICD-10-CM | POA: Diagnosis not present

## 2018-10-08 DIAGNOSIS — E538 Deficiency of other specified B group vitamins: Secondary | ICD-10-CM | POA: Diagnosis not present

## 2018-10-11 ENCOUNTER — Telehealth: Payer: Self-pay | Admitting: Interventional Cardiology

## 2018-10-11 NOTE — Telephone Encounter (Signed)
Last office visit 10/08, the patient's wife said that Dr. Tamala Julian was concerned about his weight loss. He has lost an additional 5 lbs since 10/08. The patient eats 4 meals a day and he is still losing weight.   Sending to Dr. Tamala Julian for recommendations.

## 2018-10-11 NOTE — Telephone Encounter (Signed)
New Message   Pt c/o medication issue:  1. Name of Medication: furosemide (LASIX) 40 MG tablet  2. How are you currently taking this medication (dosage and times per day)? Take 40 mg by mouth daily  3. Are you having a reaction (difficulty breathing--STAT)? no  4. What is your medication issue? Pt wife states Dr. Tamala Julian asked them to watch the pt weight and if he lost anymore that some adjustments need to be made to this medications. Pt's wife says the pt lost 5 more lbs.Please call

## 2018-10-12 NOTE — Telephone Encounter (Signed)
Lease have a TSH and T4 drawn with the diagnoses weight loss.

## 2018-10-12 NOTE — Telephone Encounter (Signed)
Spoke with the patient's wife, she stated that they went to neurologist last week and had testing done to rule out pancreatic cancer. The results all came back normal. They have seen [name] multiple times about his weight loss. He has had multiple lab tests and his last TSH was normal and it was in the past month. The patient's wife did not want to do the TSH and T4 since they already had a test recently. The patient has an appointment with [name] on 12/2 and they will address his concerns than.   The patient's wife asked if he should still take lasix? Sending to Dr. Tamala Julian.

## 2018-10-12 NOTE — Telephone Encounter (Signed)
I do not believe this is related to his heart. They should speak to the primary care physician concerning the weight loss.  I am concerned about it but have no solution.  Is he eating?

## 2018-10-12 NOTE — Telephone Encounter (Signed)
If the systolic blood pressures are generally < 120 mmHg, stopping lasix could be done but needs to weigh daily and observe for swelling/dyspnea/rapid weight gain > 5 lbs

## 2018-10-13 NOTE — Telephone Encounter (Signed)
Spoke with the patient's wife, she expressed understanding and said that his blood pressure are usually around 120s plus. Advised her to continue taking lasix until he is consistently lower blood pressure. She stated the patient has been doing well on lasix. She had no further questions.

## 2018-11-05 DIAGNOSIS — Z7901 Long term (current) use of anticoagulants: Secondary | ICD-10-CM | POA: Diagnosis not present

## 2018-11-05 DIAGNOSIS — R634 Abnormal weight loss: Secondary | ICD-10-CM | POA: Diagnosis not present

## 2018-11-09 DIAGNOSIS — D0439 Carcinoma in situ of skin of other parts of face: Secondary | ICD-10-CM | POA: Diagnosis not present

## 2018-11-09 DIAGNOSIS — D485 Neoplasm of uncertain behavior of skin: Secondary | ICD-10-CM | POA: Diagnosis not present

## 2018-11-09 DIAGNOSIS — L821 Other seborrheic keratosis: Secondary | ICD-10-CM | POA: Diagnosis not present

## 2018-11-09 DIAGNOSIS — D1801 Hemangioma of skin and subcutaneous tissue: Secondary | ICD-10-CM | POA: Diagnosis not present

## 2018-11-09 DIAGNOSIS — D225 Melanocytic nevi of trunk: Secondary | ICD-10-CM | POA: Diagnosis not present

## 2018-11-09 DIAGNOSIS — Z85828 Personal history of other malignant neoplasm of skin: Secondary | ICD-10-CM | POA: Diagnosis not present

## 2018-11-25 DIAGNOSIS — F341 Dysthymic disorder: Secondary | ICD-10-CM | POA: Diagnosis not present

## 2018-11-25 DIAGNOSIS — I4891 Unspecified atrial fibrillation: Secondary | ICD-10-CM | POA: Diagnosis not present

## 2018-11-25 DIAGNOSIS — G309 Alzheimer's disease, unspecified: Secondary | ICD-10-CM | POA: Diagnosis not present

## 2018-11-25 DIAGNOSIS — I2510 Atherosclerotic heart disease of native coronary artery without angina pectoris: Secondary | ICD-10-CM | POA: Diagnosis not present

## 2018-11-25 DIAGNOSIS — R634 Abnormal weight loss: Secondary | ICD-10-CM | POA: Diagnosis not present

## 2018-11-25 DIAGNOSIS — I10 Essential (primary) hypertension: Secondary | ICD-10-CM | POA: Diagnosis not present

## 2018-11-25 DIAGNOSIS — E538 Deficiency of other specified B group vitamins: Secondary | ICD-10-CM | POA: Diagnosis not present

## 2018-11-25 DIAGNOSIS — Z7901 Long term (current) use of anticoagulants: Secondary | ICD-10-CM | POA: Diagnosis not present

## 2018-12-11 ENCOUNTER — Encounter (HOSPITAL_COMMUNITY): Payer: Self-pay | Admitting: Emergency Medicine

## 2018-12-11 ENCOUNTER — Emergency Department (HOSPITAL_COMMUNITY): Payer: Medicare Other

## 2018-12-11 ENCOUNTER — Emergency Department (HOSPITAL_COMMUNITY)
Admission: EM | Admit: 2018-12-11 | Discharge: 2018-12-11 | Disposition: A | Discharge status: Home / Self Care | Payer: Medicare Other | Attending: Emergency Medicine | Admitting: Emergency Medicine

## 2018-12-11 DIAGNOSIS — I482 Chronic atrial fibrillation, unspecified: Secondary | ICD-10-CM | POA: Insufficient documentation

## 2018-12-11 DIAGNOSIS — Z7901 Long term (current) use of anticoagulants: Secondary | ICD-10-CM | POA: Insufficient documentation

## 2018-12-11 DIAGNOSIS — Z86718 Personal history of other venous thrombosis and embolism: Secondary | ICD-10-CM | POA: Diagnosis not present

## 2018-12-11 DIAGNOSIS — R55 Syncope and collapse: Secondary | ICD-10-CM | POA: Diagnosis not present

## 2018-12-11 DIAGNOSIS — Z8546 Personal history of malignant neoplasm of prostate: Secondary | ICD-10-CM | POA: Insufficient documentation

## 2018-12-11 DIAGNOSIS — I4891 Unspecified atrial fibrillation: Secondary | ICD-10-CM | POA: Diagnosis not present

## 2018-12-11 DIAGNOSIS — Z87891 Personal history of nicotine dependence: Secondary | ICD-10-CM | POA: Diagnosis not present

## 2018-12-11 DIAGNOSIS — I5032 Chronic diastolic (congestive) heart failure: Secondary | ICD-10-CM | POA: Insufficient documentation

## 2018-12-11 DIAGNOSIS — I2510 Atherosclerotic heart disease of native coronary artery without angina pectoris: Secondary | ICD-10-CM | POA: Diagnosis not present

## 2018-12-11 DIAGNOSIS — S0990XA Unspecified injury of head, initial encounter: Secondary | ICD-10-CM | POA: Diagnosis not present

## 2018-12-11 DIAGNOSIS — Z79899 Other long term (current) drug therapy: Secondary | ICD-10-CM | POA: Diagnosis not present

## 2018-12-11 DIAGNOSIS — E119 Type 2 diabetes mellitus without complications: Secondary | ICD-10-CM | POA: Insufficient documentation

## 2018-12-11 DIAGNOSIS — Z951 Presence of aortocoronary bypass graft: Secondary | ICD-10-CM | POA: Diagnosis not present

## 2018-12-11 DIAGNOSIS — I110 Hypertensive heart disease with heart failure: Secondary | ICD-10-CM | POA: Insufficient documentation

## 2018-12-11 DIAGNOSIS — I951 Orthostatic hypotension: Secondary | ICD-10-CM

## 2018-12-11 LAB — COMPREHENSIVE METABOLIC PANEL
ALBUMIN: 3.6 g/dL (ref 3.5–5.0)
ALT: 16 U/L (ref 0–44)
ANION GAP: 9 (ref 5–15)
AST: 22 U/L (ref 15–41)
Alkaline Phosphatase: 65 U/L (ref 38–126)
BILIRUBIN TOTAL: 0.8 mg/dL (ref 0.3–1.2)
BUN: 18 mg/dL (ref 8–23)
CO2: 27 mmol/L (ref 22–32)
Calcium: 9.2 mg/dL (ref 8.9–10.3)
Chloride: 106 mmol/L (ref 98–111)
Creatinine, Ser: 0.87 mg/dL (ref 0.61–1.24)
GFR calc Af Amer: >60 mL/min (ref >60)
GFR calc non Af Amer: >60 mL/min (ref >60)
GLUCOSE: 114 mg/dL — AB (ref 70–99)
POTASSIUM: 3.8 mmol/L (ref 3.5–5.1)
SODIUM: 142 mmol/L (ref 135–145)
TOTAL PROTEIN: 5.7 g/dL — AB (ref 6.5–8.1)

## 2018-12-11 LAB — CBC WITH DIFFERENTIAL/PLATELET
ABS IMMATURE GRANULOCYTES: 0.01 10*3/uL (ref 0.00–0.07)
BASOS PCT: 1 %
Basophils Absolute: 0 10*3/uL (ref 0.0–0.1)
Eosinophils Absolute: 0 10*3/uL (ref 0.0–0.5)
Eosinophils Relative: 1 %
HCT: 40.9 % (ref 39.0–52.0)
HEMOGLOBIN: 13 g/dL (ref 13.0–17.0)
IMMATURE GRANULOCYTES: 0 %
LYMPHS PCT: 22 %
Lymphs Abs: 0.8 10*3/uL (ref 0.7–4.0)
MCH: 28.6 pg (ref 26.0–34.0)
MCHC: 31.8 g/dL (ref 30.0–36.0)
MCV: 90.1 fL (ref 80.0–100.0)
Monocytes Absolute: 0.3 10*3/uL (ref 0.1–1.0)
Monocytes Relative: 9 %
NEUTROS ABS: 2.3 10*3/uL (ref 1.7–7.7)
NEUTROS PCT: 67 %
PLATELETS: 93 10*3/uL — AB (ref 150–400)
RBC: 4.54 MIL/uL (ref 4.22–5.81)
RDW: 14.6 % (ref 11.5–15.5)
WBC: 3.5 10*3/uL — ABNORMAL LOW (ref 4.0–10.5)
nRBC: 0 % (ref 0.0–0.2)

## 2018-12-11 LAB — PROTIME-INR
INR: 2.64
PROTHROMBIN TIME: 27.8 s — AB (ref 11.4–15.2)

## 2018-12-11 LAB — I-STAT TROPONIN, ED: TROPONIN I, POC: 0.01 ng/mL (ref 0.00–0.08)

## 2018-12-11 MED ORDER — SODIUM CHLORIDE 0.9 % IV BOLUS
500.0000 mL | Freq: Once | INTRAVENOUS | Status: AC
  Administered 2018-12-11: 500 mL via INTRAVENOUS

## 2018-12-11 NOTE — Discharge Instructions (Addendum)
Your INR was 2.6 today.  Your blood pressure was slightly low.  Stop taking your ramipril (altace) and follow up with your doctor in the next few days for recheck.

## 2018-12-11 NOTE — ED Notes (Signed)
D/C REVIEWED WITH PATIENT AND SPOUSE

## 2018-12-11 NOTE — ED Triage Notes (Signed)
Patient arrived via [facility] from home related to a witnessed syncope. Patient's wife reports that she witnessed a 2 minutes syncope.  NT:ZGYF, takes warfarin

## 2018-12-11 NOTE — ED Notes (Signed)
Got patient undress on the monitor did ekg shown to Dr. Ralene Bathe patient is resting with family at bedside and call bell in reach

## 2018-12-11 NOTE — ED Provider Notes (Signed)
[facility] EMERGENCY DEPARTMENT Provider Note   CSN: [id number] Arrival date & time: 12/11/18  0848     History   Chief Complaint No chief complaint on file.   HPI Jacob [name] is a 73 y.o. male.  The history is provided by the patient, medical records, the EMS personnel and the spouse. No language interpreter was used.   Jacob [name] is a 73 y.o. male who presents to the Emergency Department complaining of syncope. Level V caveat due to confusion. He presents to the emergency department by EMS for evaluation following a syncopal event. History is provided by EMS and the patient's wife. He was sitting on the edge of the bed this morning when he slid down to the floor and was unconscious for about two minutes. Patient does not recall what occurred earlier today. He denies any current complaints or recent illnesses. He does have a history of atrial fibrillation and takes Coumadin. He denies any fevers, chest pain, headache, vomiting, diarrhea. His wife reports that he slid off the bed and was semi conscious versus unconscious with his eyes fluttered back. She left the bedside to call EMS so complete duration of LOC is unclear or if there was full loss of consciousness. Total episode lasted less than two minutes. She states that he has a history of similar episodes when he has a panic attack and he was anxious over trying to shave this morning.   Past Medical History:  Diagnosis Date  . Adrenal adenoma    bilateral  . Ascending aortic aneurysm ([place])    monitored by dr dr Daneen Schick-  stable 4.4cm x 4.3cm per CT 06-01-2014  . At risk for sleep apnea    STOP-BANG= 6     SENT TO PCP 03-05-2015  . Borderline diabetes mellitus   . Chronic anticoagulation   . Chronic atrial fibrillation   . Chronic diastolic heart failure ([place])   . Chronic idiopathic constipation   . Coronary artery disease    cardiologist-  dr Daneen Schick--  s/p  CABG x2 2000/   last myoview 2012--  normal  . Diabetes mellitus without complication ([place])   . Factor 5 Leiden mutation, heterozygous ([place])   . Gross hematuria   . History of adenomatous polyp of colon    2009  . History of basal cell carcinoma excision    forehead  . History of DVT of lower extremity    x2 1980's  . History of gout   . History of prostate cancer oncologist --dr Rafael Bihari [facility]--  no recurrence since 2013   dx 2004--  s/p  radical retropubic prostatectomy/  recurrence 2006  and 2013 s/p radiation at [facility]  and completed Hormone tx  . Hx of radiation therapy    external beam proton radiation 2006  . Hypertension   . Left ureteral stone   . Multiple thyroid nodules    Benign bx done July 2014  . OA (osteoarthritis)   . Radiation proctitis   . Recovering alcoholic in remission ([place])   . S/P CABG x 2    09-17-1999  . Secondary pulmonary hypertension     Patient Active Problem List   Diagnosis Date Noted  . Weight loss, unintentional 04/20/2018  . Late onset Alzheimer's disease without behavioral disturbance ([place]) 10/20/2017  . 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 Alcohol dependence with uncomplicated  withdrawal ([place]) 08/11/2016  . Ascending aortic aneurysm ([place]) 05/29/2014  . Chronic diastolic heart failure ([place]) 05/29/2014  . Pulmonary hypertension ([place]) 05/25/2014  . Essential hypertension, benign 05/25/2014  . Hepatomegaly 05/25/2014  . CAD (coronary artery disease) of artery bypass graft 05/25/2014  . COPD (chronic obstructive pulmonary disease) ([place]) 05/25/2014  . Hyperglycemia 05/25/2014  . Hematoma 07/23/2013  . Near syncope 07/23/2013  . Neurologic cardiac syncope 01/20/2013    Class: Chronic  . Atrial fibrillation ([place]) 01/20/2013  . Factor 5 Leiden mutation, heterozygous ([place]) 01/20/2013  . Osteoarthritis of left hip 01/18/2013    Past Surgical History:    Procedure Laterality Date  . CARDIAC CATHETERIZATION  09-03-1999  dr Daneen Schick   Abnormal myoview/  80% mLAD,  total occlusion pRCA , collaterals from pRCA segment and left CFX and LAD,  OM1 sig. lesion,  total occlusion very small OM2,  persived LVSF,  elevated end-diastolic pressure  . CARDIOVASCULAR STRESS TEST  03-04-2011   dr Daneen Schick   normal perfusion study/  attenuation artifact in inferior region of myocardium,  no ischemia or infarct/scar,  unable to gate wall motion due to irregular rhythm  . COLONOSCOPY WITH PROPOFOL [name] 12/27/2013   Procedure: COLONOSCOPY WITH PROPOFOL;  Surgeon: Garlan Fair, MD;  Location: [facility];  Service: Endoscopy;  Laterality: [name];  . CORONARY ARTERY BYPASS GRAFT  09-17-1999   dr gerhardt   x2 vessel--  LIMA to LAD and RCA  . CYSTOSCOPY WITH RETROGRADE PYELOGRAM, URETEROSCOPY AND STENT PLACEMENT Left 03/07/2015   Procedure: CYSTOSCOPY WITH RETROGRADE PYELOGRAM, URETEROSCOPY  WITH STONE EXTRACTION AND STENT PLACEMENT;  Surgeon: Rana Snare, MD;  Location[facility];  Service: Urology;  Laterality: Left;  . EXCISIONAL BX OF MASS, LEFT DISTAL BRACHIUM  08-09-2002  . HEMORRHOID SURGERY  2000  . HOLMIUM LASER APPLICATION Left 3/81/0175   Procedure: HOLMIUM LASER APPLICATION;  Surgeon: Rana Snare, MD;  Location: [facility];  Service: Urology;  Laterality: Left;  . KNEE SURGERY    . LUMBAR DISKECOTMY AND FUSION  1972   L4 -- L5  . RADICAL RETROPUBIC PROSTATECTOMY  02-20-2003  . TONSILLECTOMY  as child  . TOTAL HIP ARTHROPLASTY Left 01/18/2013   Procedure: TOTAL HIP ARTHROPLASTY;  Surgeon: Garald Balding, MD;  Location: [facility];  Service: Orthopedics;  Laterality: Left;  Left Total Hip Arthroplasty  . TRANSTHORACIC ECHOCARDIOGRAM  05-10-2013  dr Daneen Schick   mild LVH/  55-60%/  severe LAE/  mild to moderate MR and TR/  mild RAE/  moderate to severe RV elevated systolic pressure/  IVC demonstates <50% collapse w/  respiration/  due to atrial fib. unable evaluate diastolic function        Home Medications    Prior to Admission medications   Medication Sig Start Date End Date Taking? Authorizing Provider  carvedilol (COREG) 3.125 MG tablet TAKE 1 TABLET BY MOUTH TWICE A DAY WITH A MEAL Patient taking differently: Take 3.125 mg by mouth 2 (two) times daily with a meal.  09/13/18  Yes Belva Crome, MD  Cholecalciferol (VITAMIN D3) 2000 units TABS Take 1 tablet by mouth daily.    Yes [provider]  colchicine 0.6 MG tablet Take 0.6 mg by mouth daily.    Yes [provider]  Cyanocobalamin (B-12 COMPLIANCE INJECTION IJ) Inject as directed every 30 (thirty) days.    Yes [provider]  dutasteride (AVODART) 0.5 MG capsule Take 0.5 mg by mouth daily.  Yes [provider]  escitalopram (LEXAPRO) 10 MG tablet Take 1.5 tablets (15 mg total) by mouth daily. Patient taking differently: Take 10 mg by mouth daily.  10/07/18  Yes Dohmeier, Asencion Partridge, MD  folic acid (FOLVITE) 1 MG tablet Take 1 mg by mouth daily.   Yes [provider]  furosemide (LASIX) 40 MG tablet Take 40 mg by mouth daily.   Yes [provider]  Melatonin 3-2 MG TABS Take 3 mg by mouth at bedtime.   Yes [provider]  memantine (NAMENDA) 10 MG tablet Take 1 tablet (10 mg total) by mouth 2 (two) times daily. 10/20/17  Yes Dohmeier, Asencion Partridge, MD  Multiple Vitamins-Minerals (PRESERVISION AREDS) TABS Take 2 tablets by mouth daily.   Yes [provider]  ramipril (ALTACE) 2.5 MG capsule Take 2.5 mg by mouth daily.   Yes [provider]  warfarin (COUMADIN) 5 MG tablet Take 1-1.5 tablets (5-7.5 mg total) by mouth daily. Take 1 tablet daily except on Sundays take 1 1/2 tablet (7.5mg ) Patient taking differently: Take 5-7.5 mg by mouth See admin instructions. 7.5mg  on Tuesday and Friday then 5mg  on all other days of the week 06/05/15  Yes Belva Crome, MD    Family  History Family History  Problem Relation Age of Onset  . Cancer Mother   . CAD Mother   . Heart attack Mother   . Heart attack Father   . Valvular heart disease Father   . Thyroid disease Father   . Heart failure Father     Social History Social History   Tobacco Use  . Smoking status: Former Smoker    Packs/day: 1.00    Years: 15.00    Pack years: 15.00    Last attempt to quit: 11/24/1985    Years since quitting: 33.0  . Smokeless tobacco: Never Used  Substance Use Topics  . Alcohol use: No    Comment: Recovery alcoholic --  attends AA  . Drug use: No     Allergies   Percocet [oxycodone-acetaminophen]; Allopurinol; Ciprofloxacin; Crestor [rosuvastatin calcium]; and Dilaudid [hydromorphone hcl]   Review of Systems Review of Systems  All other systems reviewed and are negative.    Physical Exam Updated Vital Signs BP 118/74   Pulse 87   Temp 97.8 F (36.6 C) (Oral)   Resp 20   Ht 6\' 3"  (1.905 m)   Wt 79.4 kg   SpO2 100%   BMI 21.87 kg/m   Physical Exam Vitals signs and nursing note reviewed.  Constitutional:      Appearance: He is well-developed.  HENT:     Head: Normocephalic and atraumatic.  Cardiovascular:     Rate and Rhythm: Normal rate and regular rhythm.     Heart sounds: No murmur.  Pulmonary:     Effort: Pulmonary effort is normal. No respiratory distress.     Breath sounds: Normal breath sounds.  Abdominal:     Palpations: Abdomen is soft.     Tenderness: There is no abdominal tenderness. There is no guarding or rebound.  Musculoskeletal:        General: No swelling or tenderness.     Comments: 2+ DP pulses bilaterally  Skin:    General: Skin is warm and dry.  Neurological:     Mental Status: He is alert.     Comments: Oriented to person and place. Disoriented to time. No asymmetry of facial muscles. No pronator drift. Five out of five strength in all four extremities  with sensation to light touch intact in all four extremities. Visual  fields are grossly intact.  Psychiatric:        Mood and Affect: Mood normal.        Behavior: Behavior normal.      ED Treatments / Results  Labs (all labs ordered are listed, but only abnormal results are displayed) Labs Reviewed  COMPREHENSIVE METABOLIC PANEL - Abnormal; Notable for the following components:      Result Value   Glucose, Bld 114 (*)    Total Protein 5.7 (*)    All other components within normal limits  CBC WITH DIFFERENTIAL/PLATELET - Abnormal; Notable for the following components:   WBC 3.5 (*)    Platelets 93 (*)    All other components within normal limits  PROTIME-INR - Abnormal; Notable for the following components:   Prothrombin Time 27.8 (*)    All other components within normal limits  URINALYSIS, ROUTINE W REFLEX MICROSCOPIC  I-STAT TROPONIN, ED    EKG EKG Interpretation  Date/Time:  Saturday December 11 2018 08:50:32 EST Ventricular Rate:  72 PR Interval:    QRS Duration: 100 QT Interval:  410 QTC Calculation: 449 R Axis:   24 Text Interpretation:  Atrial fibrillation RSR' in V1 or V2, probably normal variant No significant change since last tracing Confirmed by Quintella Reichert [phone]) on 12/11/2018 9:03:09 AM   Radiology Dg Chest 2 View  Result Date: 12/11/2018 CLINICAL DATA:  Syncope EXAM: CHEST - 2 VIEW COMPARISON:  Chest radiograph February 02, 2017 and chest CT June 03, 2018 FINDINGS: There is no appreciable edema or consolidation. Heart size and pulmonary vascularity are normal. There is aortic atherosclerosis with aorta somewhat tortuous. No evident adenopathy. Patient is status post median sternotomy. There is anterior wedging of a midthoracic vertebral body, stable. IMPRESSION: No edema or consolidation. Stable cardiac silhouette. There is aortic atherosclerosis. Aortic Atherosclerosis (ICD10-I70.0). Electronically Signed   By: Lowella Grip III M.D.   On: 12/11/2018 10:53   Ct Head Wo Contrast  Result Date: 12/11/2018 CLINICAL  DATA:  Pain following fall.  Syncopal episode. EXAM: CT HEAD WITHOUT CONTRAST TECHNIQUE: Contiguous axial images were obtained from the base of the skull through the vertex without intravenous contrast. COMPARISON:  Head CT October 07, 2016 and brain MRI August 26, 2016 FINDINGS: Brain: There is stable mild diffuse atrophy. There is no intracranial mass, hemorrhage, extra-axial fluid collection, or midline shift. There is slight small vessel disease in the centra semiovale bilaterally. No acute infarct is demonstrable. Vascular: There is no appreciable hyperdense vessel. There is calcification in the distal right vertebral artery. There is calcification in both carotid siphon regions. Skull: The bony calvarium appears intact. Sinuses/Orbits: There is mucosal thickening in several ethmoid air cells. Other visualized paranasal sinuses are clear. Orbits appear symmetric bilaterally. Other: Mastoid air cells are clear. IMPRESSION: Stable atrophy with mild periventricular small vessel disease. No evident acute infarct. No mass or hemorrhage. There are foci of arterial vascular calcification. There is mucosal thickening in several ethmoid air cells. Electronically Signed   By: Lowella Grip III M.D.   On: 12/11/2018 10:14    Procedures Procedures (including critical care time)  Medications Ordered in ED Medications  sodium chloride 0.9 % bolus 500 mL (0 mLs Intravenous Stopped 12/11/18 1101)     Initial Impression / Assessment and Plan / ED Course  I have reviewed the triage vital signs and the nursing notes.  Pertinent labs & imaging results that were available  during my care of the patient were reviewed by me and considered in my medical decision making (see chart for details).     Patient with history of a fib, DVT, prostate cancer and aortic thoracic aneurysm here for evaluation following syncopal events. He is asymptomatic in the emergency department. Wife states that he has had prior similar  episodes in the past related to panic attacks. He does have some mild ortho stasis that is asymptomatic in the department. He is in atrial fibrillation with a heart rate in the 70s. Labs demonstrate stable thrombocytopenia. He has a therapeutic INR of 2.6. Presentation is not consistent with PE, ACS, dissection, ruptured aneurysm, life-threatening arrhythmia. Discussed with patient and wife discontinuing his ACE inhibitor with outpatient follow-up as well as return precautions.  Final Clinical Impressions(s) / ED Diagnoses   Final diagnoses:  Syncope, unspecified syncope type  Orthostasis    ED Discharge Orders    None       Quintella Reichert, MD 12/11/18 1115

## 2018-12-13 DIAGNOSIS — R55 Syncope and collapse: Secondary | ICD-10-CM | POA: Diagnosis not present

## 2018-12-30 DIAGNOSIS — E538 Deficiency of other specified B group vitamins: Secondary | ICD-10-CM | POA: Diagnosis not present

## 2018-12-30 DIAGNOSIS — Z7901 Long term (current) use of anticoagulants: Secondary | ICD-10-CM | POA: Diagnosis not present

## 2018-12-30 DIAGNOSIS — I482 Chronic atrial fibrillation, unspecified: Secondary | ICD-10-CM | POA: Diagnosis not present

## 2018-12-30 DIAGNOSIS — R55 Syncope and collapse: Secondary | ICD-10-CM | POA: Diagnosis not present

## 2019-01-10 ENCOUNTER — Other Ambulatory Visit: Payer: Self-pay | Admitting: Neurology

## 2019-01-25 ENCOUNTER — Encounter: Payer: Self-pay | Admitting: Internal Medicine

## 2019-01-28 DIAGNOSIS — Z7901 Long term (current) use of anticoagulants: Secondary | ICD-10-CM | POA: Diagnosis not present

## 2019-01-28 DIAGNOSIS — E538 Deficiency of other specified B group vitamins: Secondary | ICD-10-CM | POA: Diagnosis not present

## 2019-03-02 ENCOUNTER — Telehealth: Payer: Self-pay | Admitting: Neurology

## 2019-03-02 NOTE — Telephone Encounter (Signed)
Pts wife called in and stated she wants to discuss her husbands weight. He has went down 15lbs since last visit

## 2019-03-02 NOTE — Telephone Encounter (Signed)
Called the wife back and spoke with her.She states that despite the patient eating 4 times a day and eating dessert he has lost 15 more pounds since the last office visit with Dr Brett Fairy in which he was already 30 lbs under weight at that visit. She knew scans and test had been done and Dr Dohmeier had addressed this previously but she wanted to know Dr Dohmeier's thoughts. I advised the patient's wife that Dr Brett Fairy is currently on vacation at this time until the middle of next week. I asked had she reached out to the PCP Dr Inda Merlin on this matter. She states that she has and he was blunt (which she liked) and told her that he may not survive another year with being this malnourished. She really wanted to bring this mainly to Dr Dohmeier's attention and have Dr Dohmeier call her and discuss further and just give honest opinion on what she thinks. She knows before it was mentioned this was not typical of dementia patient's in this stage. He is still currently feeding and dressing himself.She just doesn't know how his body can maintain with continuing to loose weight. I advised that since she has already spoke with PCP regarding this concern and sounded like she just wants to talk to Dr Brett Fairy and get her opinion, I informed her I would send this information to Dr Brett Fairy to review. I did advise the pt's wife that she may not get a response until Dr Brett Fairy returns from vacation. She verbalized understanding and was appreciative for talking with her.

## 2019-03-07 NOTE — Telephone Encounter (Signed)
I was able to talk at length with Mrs. Krakowski.  Her main concern is that her husband is continuing to lose weight, but his cognitive decline has progressed parallel to this development, but she also states that his personality seems not to have changed and he seems not to be anxious.  When she asked Mr. Buchheit primary care physician for a direct answer to her question if this is related to dementia and how long her husband's life expectancy may be, she received an estimated survival time of 12 month. I agreed that in the absence of any gastrointestinal disorder, the absence of any malignancy ( for which we have searched for long time), and in the presence of a good appetite, I have no other explanation for his weight and muscle mass loss especially.   I feel unable to give her an exact survival time but I do agree that somewhere between 6 and 18 months is the most likely range.   Mrs. Anastasia stated that she keeps her husband isolated  In this time of Corvid19 danger, they have video  interactions with children and grandchildren, and that she is grateful he is not in pain and not difficult to live with.    I encouraged her to continue taking such great care of him and that we will have a face-to-face or another video conference in 1- to 3 months.

## 2019-03-07 NOTE — Telephone Encounter (Signed)
FYI for Nurse Mardene Celeste, only reached VM. Offered to call her back at 3 Pm.  This was my first message Ieft. CD

## 2019-03-17 DIAGNOSIS — I482 Chronic atrial fibrillation, unspecified: Secondary | ICD-10-CM | POA: Diagnosis not present

## 2019-03-21 DIAGNOSIS — I4891 Unspecified atrial fibrillation: Secondary | ICD-10-CM | POA: Diagnosis not present

## 2019-03-21 DIAGNOSIS — I10 Essential (primary) hypertension: Secondary | ICD-10-CM | POA: Diagnosis not present

## 2019-03-21 DIAGNOSIS — G309 Alzheimer's disease, unspecified: Secondary | ICD-10-CM | POA: Diagnosis not present

## 2019-03-21 DIAGNOSIS — D649 Anemia, unspecified: Secondary | ICD-10-CM | POA: Diagnosis not present

## 2019-03-21 DIAGNOSIS — E782 Mixed hyperlipidemia: Secondary | ICD-10-CM | POA: Diagnosis not present

## 2019-03-21 DIAGNOSIS — I2510 Atherosclerotic heart disease of native coronary artery without angina pectoris: Secondary | ICD-10-CM | POA: Diagnosis not present

## 2019-03-21 DIAGNOSIS — I5032 Chronic diastolic (congestive) heart failure: Secondary | ICD-10-CM | POA: Diagnosis not present

## 2019-03-21 DIAGNOSIS — F341 Dysthymic disorder: Secondary | ICD-10-CM | POA: Diagnosis not present

## 2019-03-21 DIAGNOSIS — Z8546 Personal history of malignant neoplasm of prostate: Secondary | ICD-10-CM | POA: Diagnosis not present

## 2019-03-21 DIAGNOSIS — I482 Chronic atrial fibrillation, unspecified: Secondary | ICD-10-CM | POA: Diagnosis not present

## 2019-03-21 DIAGNOSIS — E785 Hyperlipidemia, unspecified: Secondary | ICD-10-CM | POA: Diagnosis not present

## 2019-03-21 DIAGNOSIS — C61 Malignant neoplasm of prostate: Secondary | ICD-10-CM | POA: Diagnosis not present

## 2019-03-31 ENCOUNTER — Other Ambulatory Visit: Payer: Self-pay | Admitting: Cardiothoracic Surgery

## 2019-03-31 DIAGNOSIS — I712 Thoracic aortic aneurysm, without rupture, unspecified: Secondary | ICD-10-CM

## 2019-04-05 NOTE — Telephone Encounter (Signed)
Due to current COVID 19 pandemic, our office is severely reducing in office visits until further notice, in order to minimize the risk to our patients and healthcare providers.   Spoke with patient's wife regarding patient's upcoming appointment. She states that she does not think that a virtual visit or in-office visit would be beneficial at this time, but she agrees to a telephone visit. She believes that this would be less stressful for the patient. I advised that she will receive a call from RN prior to appointment to update patient's chart/medications, as well as a call from front office staff 30 minutes prior to appt. She understands that Dr. Brett Fairy will call for the appointment.  Pt understands that although there may be some limitations with this type of visit, we will take all precautions to reduce any security or privacy concerns.  Pt understands that this will be treated like an in office visit and we will file with pt's insurance, and there may be a patient responsible charge related to this service.

## 2019-04-06 ENCOUNTER — Encounter: Payer: Self-pay | Admitting: Neurology

## 2019-04-06 NOTE — Telephone Encounter (Signed)
Current weight is 162lb

## 2019-04-06 NOTE — Addendum Note (Signed)
Addended by: Darleen Crocker on: 04/06/2019 02:44 PM   Modules accepted: Orders

## 2019-04-06 NOTE — Telephone Encounter (Signed)
Called the patient to review their chart and made sure that everything was up to date. Patient informed they received the e-mail/text message for the visit. Instructed to make sure they hold on to the e-mail/text for the upcoming appointment as it is necessary to access their appointment. Instructed the patient that apx 30 min prior to the appointment the front staff will contact them to make sure they are ready to go for their appointment in case there is any need for troubleshooting it can be completed prior to the appointment time. Reminded the patient once more that this is treated as a Office visit and the patient must be prepared for the visit and ready at the time of their appointment preferably in a well lit area where they have good connection for the visit. Pt verbalized understanding.  Patient will be by phone and ready at the time of the visit.

## 2019-04-07 ENCOUNTER — Ambulatory Visit: Payer: Medicare Other | Admitting: Neurology

## 2019-04-11 ENCOUNTER — Encounter: Payer: Self-pay | Admitting: Neurology

## 2019-04-11 ENCOUNTER — Other Ambulatory Visit: Payer: Self-pay

## 2019-04-11 ENCOUNTER — Ambulatory Visit (INDEPENDENT_AMBULATORY_CARE_PROVIDER_SITE_OTHER): Payer: Medicare Other | Admitting: Neurology

## 2019-04-11 DIAGNOSIS — C7951 Secondary malignant neoplasm of bone: Secondary | ICD-10-CM

## 2019-04-11 DIAGNOSIS — I5032 Chronic diastolic (congestive) heart failure: Secondary | ICD-10-CM

## 2019-04-11 DIAGNOSIS — I7121 Aneurysm of the ascending aorta, without rupture: Secondary | ICD-10-CM

## 2019-04-11 DIAGNOSIS — J418 Mixed simple and mucopurulent chronic bronchitis: Secondary | ICD-10-CM

## 2019-04-11 DIAGNOSIS — F0280 Dementia in other diseases classified elsewhere without behavioral disturbance: Secondary | ICD-10-CM

## 2019-04-11 DIAGNOSIS — I712 Thoracic aortic aneurysm, without rupture: Secondary | ICD-10-CM

## 2019-04-11 DIAGNOSIS — G301 Alzheimer's disease with late onset: Secondary | ICD-10-CM | POA: Diagnosis not present

## 2019-04-11 DIAGNOSIS — F10230 Alcohol dependence with withdrawal, uncomplicated: Secondary | ICD-10-CM | POA: Diagnosis not present

## 2019-04-11 DIAGNOSIS — D6851 Activated protein C resistance: Secondary | ICD-10-CM

## 2019-04-11 DIAGNOSIS — C61 Malignant neoplasm of prostate: Secondary | ICD-10-CM

## 2019-04-11 MED ORDER — ESCITALOPRAM OXALATE 10 MG PO TABS: 15.0000 mg | ORAL_TABLET | Freq: Every day | ORAL | 2 refills | Status: AC

## 2019-04-11 MED ORDER — MEMANTINE HCL 10 MG PO TABS: 10.0000 mg | ORAL_TABLET | Freq: Two times a day (BID) | ORAL | 3 refills | Status: AC

## 2019-04-11 NOTE — Progress Notes (Signed)
Virtual Visit via Telephone Note  I connected with Jacob [name] on 04/11/19 at  1:00 PM EDT by telephone and verified that I am speaking with the correct person using two identifiers.  Location: Patient: wife at home.  Provider: at [facility]   I discussed the limitations, risks, security and privacy concerns of performing an evaluation and management service by telephone and the availability of in person appointments. I also discussed with the patient that there may be a patient responsible charge related to this service. The patient expressed understanding and agreed to proceed.   History of Present Illness: advancing dementia, worries about weight loss. He has a great appetite.   Last conversation 03-07-2019. Weight was 168 pounds, now 158 pounds.   Observations/Objective: new very raspy voice, lots of phlegm, coughing after each meal and each drink. Dysphonia, dysphagia - swallowing apraxia.     Assessment and Plan: I offered repeated swallowing evaluation. Had a test in 2014 at [facility].  The unintended Weight loss continues- and he has seen PCP, GI and [facility]. Including Endoscopy, barium swallowing, cancer screening/ CT scan.   Wife is interested in keeping him in comfort.     Follow Up Instructions:  phone call for support every 3 month.    I discussed the assessment and treatment plan with the patient. The patient was provided an opportunity to ask questions and all were answered. The patient agreed with the plan and demonstrated an understanding of the instructions.   The patient was advised to call back or seek an in-person evaluation if the symptoms worsen or if the condition fails to improve as anticipated.  I provided 15 minutes of non-face-to-face time during this encounter.   Larey Seat, MD

## 2019-04-14 DIAGNOSIS — I482 Chronic atrial fibrillation, unspecified: Secondary | ICD-10-CM | POA: Diagnosis not present

## 2019-05-04 DIAGNOSIS — I10 Essential (primary) hypertension: Secondary | ICD-10-CM | POA: Diagnosis not present

## 2019-05-04 DIAGNOSIS — F0280 Dementia in other diseases classified elsewhere without behavioral disturbance: Secondary | ICD-10-CM | POA: Diagnosis not present

## 2019-05-04 DIAGNOSIS — Z8546 Personal history of malignant neoplasm of prostate: Secondary | ICD-10-CM | POA: Diagnosis not present

## 2019-05-04 DIAGNOSIS — Z741 Need for assistance with personal care: Secondary | ICD-10-CM | POA: Diagnosis not present

## 2019-05-04 DIAGNOSIS — F329 Major depressive disorder, single episode, unspecified: Secondary | ICD-10-CM | POA: Diagnosis not present

## 2019-05-04 DIAGNOSIS — Z681 Body mass index (BMI) 19 or less, adult: Secondary | ICD-10-CM | POA: Diagnosis not present

## 2019-05-04 DIAGNOSIS — E43 Unspecified severe protein-calorie malnutrition: Secondary | ICD-10-CM | POA: Diagnosis not present

## 2019-05-04 DIAGNOSIS — I4891 Unspecified atrial fibrillation: Secondary | ICD-10-CM | POA: Diagnosis not present

## 2019-05-04 DIAGNOSIS — N400 Enlarged prostate without lower urinary tract symptoms: Secondary | ICD-10-CM | POA: Diagnosis not present

## 2019-05-04 DIAGNOSIS — R1310 Dysphagia, unspecified: Secondary | ICD-10-CM | POA: Diagnosis not present

## 2019-05-04 DIAGNOSIS — D682 Hereditary deficiency of other clotting factors: Secondary | ICD-10-CM | POA: Diagnosis not present

## 2019-05-04 DIAGNOSIS — G309 Alzheimer's disease, unspecified: Secondary | ICD-10-CM | POA: Diagnosis not present

## 2019-05-06 DIAGNOSIS — I10 Essential (primary) hypertension: Secondary | ICD-10-CM | POA: Diagnosis not present

## 2019-05-06 DIAGNOSIS — I4891 Unspecified atrial fibrillation: Secondary | ICD-10-CM | POA: Diagnosis not present

## 2019-05-06 DIAGNOSIS — E43 Unspecified severe protein-calorie malnutrition: Secondary | ICD-10-CM | POA: Diagnosis not present

## 2019-05-06 DIAGNOSIS — R1310 Dysphagia, unspecified: Secondary | ICD-10-CM | POA: Diagnosis not present

## 2019-05-06 DIAGNOSIS — F329 Major depressive disorder, single episode, unspecified: Secondary | ICD-10-CM | POA: Diagnosis not present

## 2019-05-06 DIAGNOSIS — D682 Hereditary deficiency of other clotting factors: Secondary | ICD-10-CM | POA: Diagnosis not present

## 2019-05-07 DIAGNOSIS — I959 Hypotension, unspecified: Secondary | ICD-10-CM | POA: Diagnosis not present

## 2019-05-07 DIAGNOSIS — I4891 Unspecified atrial fibrillation: Secondary | ICD-10-CM | POA: Diagnosis not present

## 2019-05-07 DIAGNOSIS — R197 Diarrhea, unspecified: Secondary | ICD-10-CM | POA: Diagnosis not present

## 2019-05-07 DIAGNOSIS — R0902 Hypoxemia: Secondary | ICD-10-CM | POA: Diagnosis not present

## 2019-05-07 DIAGNOSIS — R55 Syncope and collapse: Secondary | ICD-10-CM | POA: Diagnosis not present

## 2019-05-10 ENCOUNTER — Telehealth: Payer: Self-pay | Admitting: Neurology

## 2019-05-10 DIAGNOSIS — I10 Essential (primary) hypertension: Secondary | ICD-10-CM | POA: Diagnosis not present

## 2019-05-10 DIAGNOSIS — D682 Hereditary deficiency of other clotting factors: Secondary | ICD-10-CM | POA: Diagnosis not present

## 2019-05-10 DIAGNOSIS — F329 Major depressive disorder, single episode, unspecified: Secondary | ICD-10-CM | POA: Diagnosis not present

## 2019-05-10 DIAGNOSIS — R1310 Dysphagia, unspecified: Secondary | ICD-10-CM | POA: Diagnosis not present

## 2019-05-10 DIAGNOSIS — E43 Unspecified severe protein-calorie malnutrition: Secondary | ICD-10-CM | POA: Diagnosis not present

## 2019-05-10 DIAGNOSIS — I4891 Unspecified atrial fibrillation: Secondary | ICD-10-CM | POA: Diagnosis not present

## 2019-05-10 NOTE — Telephone Encounter (Signed)
Received a letter indicating the patient has been admitted to hospice care through [facility].

## 2019-05-12 DIAGNOSIS — E538 Deficiency of other specified B group vitamins: Secondary | ICD-10-CM | POA: Diagnosis not present

## 2019-05-12 DIAGNOSIS — Z8546 Personal history of malignant neoplasm of prostate: Secondary | ICD-10-CM | POA: Diagnosis not present

## 2019-05-12 DIAGNOSIS — G309 Alzheimer's disease, unspecified: Secondary | ICD-10-CM | POA: Diagnosis not present

## 2019-05-12 DIAGNOSIS — I4891 Unspecified atrial fibrillation: Secondary | ICD-10-CM | POA: Diagnosis not present

## 2019-05-12 DIAGNOSIS — I482 Chronic atrial fibrillation, unspecified: Secondary | ICD-10-CM | POA: Diagnosis not present

## 2019-05-12 DIAGNOSIS — K625 Hemorrhage of anus and rectum: Secondary | ICD-10-CM | POA: Diagnosis not present

## 2019-05-20 DIAGNOSIS — D682 Hereditary deficiency of other clotting factors: Secondary | ICD-10-CM | POA: Diagnosis not present

## 2019-05-20 DIAGNOSIS — I4891 Unspecified atrial fibrillation: Secondary | ICD-10-CM | POA: Diagnosis not present

## 2019-05-20 DIAGNOSIS — E43 Unspecified severe protein-calorie malnutrition: Secondary | ICD-10-CM | POA: Diagnosis not present

## 2019-05-20 DIAGNOSIS — R1310 Dysphagia, unspecified: Secondary | ICD-10-CM | POA: Diagnosis not present

## 2019-05-20 DIAGNOSIS — F329 Major depressive disorder, single episode, unspecified: Secondary | ICD-10-CM | POA: Diagnosis not present

## 2019-05-20 DIAGNOSIS — I10 Essential (primary) hypertension: Secondary | ICD-10-CM | POA: Diagnosis not present

## 2019-05-23 DIAGNOSIS — E43 Unspecified severe protein-calorie malnutrition: Secondary | ICD-10-CM | POA: Diagnosis not present

## 2019-05-23 DIAGNOSIS — D682 Hereditary deficiency of other clotting factors: Secondary | ICD-10-CM | POA: Diagnosis not present

## 2019-05-23 DIAGNOSIS — R1310 Dysphagia, unspecified: Secondary | ICD-10-CM | POA: Diagnosis not present

## 2019-05-23 DIAGNOSIS — F329 Major depressive disorder, single episode, unspecified: Secondary | ICD-10-CM | POA: Diagnosis not present

## 2019-05-23 DIAGNOSIS — I10 Essential (primary) hypertension: Secondary | ICD-10-CM | POA: Diagnosis not present

## 2019-05-23 DIAGNOSIS — I4891 Unspecified atrial fibrillation: Secondary | ICD-10-CM | POA: Diagnosis not present

## 2019-05-25 DIAGNOSIS — F0280 Dementia in other diseases classified elsewhere without behavioral disturbance: Secondary | ICD-10-CM | POA: Diagnosis not present

## 2019-05-25 DIAGNOSIS — G309 Alzheimer's disease, unspecified: Secondary | ICD-10-CM | POA: Diagnosis not present

## 2019-05-25 DIAGNOSIS — R1310 Dysphagia, unspecified: Secondary | ICD-10-CM | POA: Diagnosis not present

## 2019-05-25 DIAGNOSIS — E43 Unspecified severe protein-calorie malnutrition: Secondary | ICD-10-CM | POA: Diagnosis not present

## 2019-05-25 DIAGNOSIS — Z681 Body mass index (BMI) 19 or less, adult: Secondary | ICD-10-CM | POA: Diagnosis not present

## 2019-05-25 DIAGNOSIS — Z8546 Personal history of malignant neoplasm of prostate: Secondary | ICD-10-CM | POA: Diagnosis not present

## 2019-05-25 DIAGNOSIS — I4891 Unspecified atrial fibrillation: Secondary | ICD-10-CM | POA: Diagnosis not present

## 2019-05-25 DIAGNOSIS — Z741 Need for assistance with personal care: Secondary | ICD-10-CM | POA: Diagnosis not present

## 2019-05-25 DIAGNOSIS — I10 Essential (primary) hypertension: Secondary | ICD-10-CM | POA: Diagnosis not present

## 2019-05-25 DIAGNOSIS — N400 Enlarged prostate without lower urinary tract symptoms: Secondary | ICD-10-CM | POA: Diagnosis not present

## 2019-05-25 DIAGNOSIS — F329 Major depressive disorder, single episode, unspecified: Secondary | ICD-10-CM | POA: Diagnosis not present

## 2019-05-25 DIAGNOSIS — D682 Hereditary deficiency of other clotting factors: Secondary | ICD-10-CM | POA: Diagnosis not present

## 2019-05-30 DIAGNOSIS — F329 Major depressive disorder, single episode, unspecified: Secondary | ICD-10-CM | POA: Diagnosis not present

## 2019-05-30 DIAGNOSIS — R1310 Dysphagia, unspecified: Secondary | ICD-10-CM | POA: Diagnosis not present

## 2019-05-30 DIAGNOSIS — E43 Unspecified severe protein-calorie malnutrition: Secondary | ICD-10-CM | POA: Diagnosis not present

## 2019-05-30 DIAGNOSIS — I10 Essential (primary) hypertension: Secondary | ICD-10-CM | POA: Diagnosis not present

## 2019-05-30 DIAGNOSIS — I4891 Unspecified atrial fibrillation: Secondary | ICD-10-CM | POA: Diagnosis not present

## 2019-05-30 DIAGNOSIS — D682 Hereditary deficiency of other clotting factors: Secondary | ICD-10-CM | POA: Diagnosis not present

## 2019-06-07 ENCOUNTER — Telehealth: Payer: Self-pay | Admitting: Neurology

## 2019-06-07 NOTE — Telephone Encounter (Signed)
Called the patient wife back and spoke with her. Since April patient has lost even more weight. He is down to 140 lbs. She states that the patient is still eating fairly decent amount. He has been admitted through hospice care. Advised the wife something she could try is premier protein shakes over the counter for the patient. It has about 30 grams of protein. Patient was appreciative. She really wanted to discuss this with Dr Brett Fairy and I had a opening at 1:30 pm on thurs. I cancelled the aug apt and put them down for telephone visit. The number is [phone] to call for the visit. She was appreciative.

## 2019-06-07 NOTE — Telephone Encounter (Signed)
Pts wife is calling in requesting a call back to discuss his alzheimer's and weight loss

## 2019-06-08 DIAGNOSIS — I4891 Unspecified atrial fibrillation: Secondary | ICD-10-CM | POA: Diagnosis not present

## 2019-06-08 DIAGNOSIS — F329 Major depressive disorder, single episode, unspecified: Secondary | ICD-10-CM | POA: Diagnosis not present

## 2019-06-08 DIAGNOSIS — D682 Hereditary deficiency of other clotting factors: Secondary | ICD-10-CM | POA: Diagnosis not present

## 2019-06-08 DIAGNOSIS — E43 Unspecified severe protein-calorie malnutrition: Secondary | ICD-10-CM | POA: Diagnosis not present

## 2019-06-08 DIAGNOSIS — R1310 Dysphagia, unspecified: Secondary | ICD-10-CM | POA: Diagnosis not present

## 2019-06-08 DIAGNOSIS — I10 Essential (primary) hypertension: Secondary | ICD-10-CM | POA: Diagnosis not present

## 2019-06-09 ENCOUNTER — Ambulatory Visit (INDEPENDENT_AMBULATORY_CARE_PROVIDER_SITE_OTHER): Admitting: Neurology

## 2019-06-09 ENCOUNTER — Other Ambulatory Visit: Payer: Self-pay

## 2019-06-09 ENCOUNTER — Encounter: Payer: Self-pay | Admitting: Neurology

## 2019-06-09 DIAGNOSIS — F329 Major depressive disorder, single episode, unspecified: Secondary | ICD-10-CM | POA: Diagnosis not present

## 2019-06-09 DIAGNOSIS — G309 Alzheimer's disease, unspecified: Secondary | ICD-10-CM | POA: Diagnosis not present

## 2019-06-09 DIAGNOSIS — I4891 Unspecified atrial fibrillation: Secondary | ICD-10-CM | POA: Diagnosis not present

## 2019-06-09 DIAGNOSIS — R1310 Dysphagia, unspecified: Secondary | ICD-10-CM | POA: Diagnosis not present

## 2019-06-09 DIAGNOSIS — D682 Hereditary deficiency of other clotting factors: Secondary | ICD-10-CM | POA: Diagnosis not present

## 2019-06-09 DIAGNOSIS — F0280 Dementia in other diseases classified elsewhere without behavioral disturbance: Secondary | ICD-10-CM | POA: Diagnosis not present

## 2019-06-09 DIAGNOSIS — I10 Essential (primary) hypertension: Secondary | ICD-10-CM | POA: Diagnosis not present

## 2019-06-09 DIAGNOSIS — E43 Unspecified severe protein-calorie malnutrition: Secondary | ICD-10-CM | POA: Diagnosis not present

## 2019-06-09 NOTE — Progress Notes (Signed)
Virtual Visit via Telephone Note  I connected with Jacob [name] on 06/09/19 at  1:30 PM EDT by telephone and verified that I am speaking with the correct person using two identifiers.  Location: Patient: wife at home.  Provider: at [facility]   I discussed the limitations, risks, security and privacy concerns of performing an evaluation and management service by telephone and the availability of in person appointments. I also discussed with the patient that there may be a patient responsible charge related to this service. The patient expressed understanding and agreed to proceed.   History of Present Illness: advancing dementia, worries about weight loss. He has a great appetite.   Last phone conversation 04-11-2019 with a weight of 158 pounds, on 03-07-2019. Weight was 168 pounds, now 140 pounds.  There has been no GI or neoplastic reason or cause found.    Observations/Objective: new very raspy voice, lots of phlegm, coughing after each meal and each drink. Dysphonia, dysphagia - swallowing apraxia.  Failure to thrive- still walking, sleeping well. He eats 3 meals a day.    Assessment and Plan: I offered repeated swallowing evaluation. Had a test in 2014 at [facility].  The unintended Weight loss continues and he is much too light- when I seen him last in person he weighted 180 pounds-  and he has seen PCP, GI and [facility]- including endoscopy, barium swallowing, cancer screening/ CT scan.   Wife is interested in keeping him in comfort- has home hospice care since 05-12-2019.   Follow Up Instructions: Phone call for support every 1-2 month. patient is declining fast- accelerated weight loss.    I discussed the assessment and treatment plan with the patient. The patient's wife was provided an opportunity to ask questions and all were answered. The patient's spouse agreed with the plan and demonstrated an understanding of the instructions.   The patient was advised to call back or  seek an in-person evaluation if the symptoms worsen or if the condition fails to improve as anticipated.  I provided 12 minutes of non-face-to-face time during this encounter.   Larey Seat, MD   06-09-2019

## 2019-06-14 DIAGNOSIS — I4891 Unspecified atrial fibrillation: Secondary | ICD-10-CM | POA: Diagnosis not present

## 2019-06-14 DIAGNOSIS — I10 Essential (primary) hypertension: Secondary | ICD-10-CM | POA: Diagnosis not present

## 2019-06-14 DIAGNOSIS — F329 Major depressive disorder, single episode, unspecified: Secondary | ICD-10-CM | POA: Diagnosis not present

## 2019-06-14 DIAGNOSIS — R1310 Dysphagia, unspecified: Secondary | ICD-10-CM | POA: Diagnosis not present

## 2019-06-14 DIAGNOSIS — D682 Hereditary deficiency of other clotting factors: Secondary | ICD-10-CM | POA: Diagnosis not present

## 2019-06-14 DIAGNOSIS — E43 Unspecified severe protein-calorie malnutrition: Secondary | ICD-10-CM | POA: Diagnosis not present

## 2019-06-16 ENCOUNTER — Other Ambulatory Visit: Payer: Medicare Other

## 2019-06-16 ENCOUNTER — Ambulatory Visit: Payer: Medicare Other | Admitting: Cardiothoracic Surgery

## 2019-06-17 DIAGNOSIS — R1310 Dysphagia, unspecified: Secondary | ICD-10-CM | POA: Diagnosis not present

## 2019-06-17 DIAGNOSIS — I4891 Unspecified atrial fibrillation: Secondary | ICD-10-CM | POA: Diagnosis not present

## 2019-06-17 DIAGNOSIS — E43 Unspecified severe protein-calorie malnutrition: Secondary | ICD-10-CM | POA: Diagnosis not present

## 2019-06-17 DIAGNOSIS — I10 Essential (primary) hypertension: Secondary | ICD-10-CM | POA: Diagnosis not present

## 2019-06-17 DIAGNOSIS — F329 Major depressive disorder, single episode, unspecified: Secondary | ICD-10-CM | POA: Diagnosis not present

## 2019-06-17 DIAGNOSIS — D682 Hereditary deficiency of other clotting factors: Secondary | ICD-10-CM | POA: Diagnosis not present

## 2019-06-21 DIAGNOSIS — F329 Major depressive disorder, single episode, unspecified: Secondary | ICD-10-CM | POA: Diagnosis not present

## 2019-06-21 DIAGNOSIS — I10 Essential (primary) hypertension: Secondary | ICD-10-CM | POA: Diagnosis not present

## 2019-06-21 DIAGNOSIS — D682 Hereditary deficiency of other clotting factors: Secondary | ICD-10-CM | POA: Diagnosis not present

## 2019-06-21 DIAGNOSIS — E43 Unspecified severe protein-calorie malnutrition: Secondary | ICD-10-CM | POA: Diagnosis not present

## 2019-06-21 DIAGNOSIS — R1310 Dysphagia, unspecified: Secondary | ICD-10-CM | POA: Diagnosis not present

## 2019-06-21 DIAGNOSIS — I4891 Unspecified atrial fibrillation: Secondary | ICD-10-CM | POA: Diagnosis not present

## 2019-06-22 DIAGNOSIS — E43 Unspecified severe protein-calorie malnutrition: Secondary | ICD-10-CM | POA: Diagnosis not present

## 2019-06-22 DIAGNOSIS — I10 Essential (primary) hypertension: Secondary | ICD-10-CM | POA: Diagnosis not present

## 2019-06-22 DIAGNOSIS — F329 Major depressive disorder, single episode, unspecified: Secondary | ICD-10-CM | POA: Diagnosis not present

## 2019-06-22 DIAGNOSIS — I4891 Unspecified atrial fibrillation: Secondary | ICD-10-CM | POA: Diagnosis not present

## 2019-06-22 DIAGNOSIS — R1310 Dysphagia, unspecified: Secondary | ICD-10-CM | POA: Diagnosis not present

## 2019-06-22 DIAGNOSIS — D682 Hereditary deficiency of other clotting factors: Secondary | ICD-10-CM | POA: Diagnosis not present

## 2019-06-23 DIAGNOSIS — R1310 Dysphagia, unspecified: Secondary | ICD-10-CM | POA: Diagnosis not present

## 2019-06-23 DIAGNOSIS — F329 Major depressive disorder, single episode, unspecified: Secondary | ICD-10-CM | POA: Diagnosis not present

## 2019-06-23 DIAGNOSIS — I4891 Unspecified atrial fibrillation: Secondary | ICD-10-CM | POA: Diagnosis not present

## 2019-06-23 DIAGNOSIS — E43 Unspecified severe protein-calorie malnutrition: Secondary | ICD-10-CM | POA: Diagnosis not present

## 2019-06-23 DIAGNOSIS — I10 Essential (primary) hypertension: Secondary | ICD-10-CM | POA: Diagnosis not present

## 2019-06-23 DIAGNOSIS — D682 Hereditary deficiency of other clotting factors: Secondary | ICD-10-CM | POA: Diagnosis not present

## 2019-06-25 DIAGNOSIS — I10 Essential (primary) hypertension: Secondary | ICD-10-CM | POA: Diagnosis not present

## 2019-06-25 DIAGNOSIS — R1310 Dysphagia, unspecified: Secondary | ICD-10-CM | POA: Diagnosis not present

## 2019-06-25 DIAGNOSIS — I4891 Unspecified atrial fibrillation: Secondary | ICD-10-CM | POA: Diagnosis not present

## 2019-06-25 DIAGNOSIS — Z681 Body mass index (BMI) 19 or less, adult: Secondary | ICD-10-CM | POA: Diagnosis not present

## 2019-06-25 DIAGNOSIS — E43 Unspecified severe protein-calorie malnutrition: Secondary | ICD-10-CM | POA: Diagnosis not present

## 2019-06-25 DIAGNOSIS — N400 Enlarged prostate without lower urinary tract symptoms: Secondary | ICD-10-CM | POA: Diagnosis not present

## 2019-06-25 DIAGNOSIS — F0280 Dementia in other diseases classified elsewhere without behavioral disturbance: Secondary | ICD-10-CM | POA: Diagnosis not present

## 2019-06-25 DIAGNOSIS — Z741 Need for assistance with personal care: Secondary | ICD-10-CM | POA: Diagnosis not present

## 2019-06-25 DIAGNOSIS — G309 Alzheimer's disease, unspecified: Secondary | ICD-10-CM | POA: Diagnosis not present

## 2019-06-25 DIAGNOSIS — D682 Hereditary deficiency of other clotting factors: Secondary | ICD-10-CM | POA: Diagnosis not present

## 2019-06-25 DIAGNOSIS — Z8546 Personal history of malignant neoplasm of prostate: Secondary | ICD-10-CM | POA: Diagnosis not present

## 2019-06-25 DIAGNOSIS — F329 Major depressive disorder, single episode, unspecified: Secondary | ICD-10-CM | POA: Diagnosis not present

## 2019-06-27 DIAGNOSIS — I10 Essential (primary) hypertension: Secondary | ICD-10-CM | POA: Diagnosis not present

## 2019-06-27 DIAGNOSIS — R1310 Dysphagia, unspecified: Secondary | ICD-10-CM | POA: Diagnosis not present

## 2019-06-27 DIAGNOSIS — F329 Major depressive disorder, single episode, unspecified: Secondary | ICD-10-CM | POA: Diagnosis not present

## 2019-06-27 DIAGNOSIS — I4891 Unspecified atrial fibrillation: Secondary | ICD-10-CM | POA: Diagnosis not present

## 2019-06-27 DIAGNOSIS — D682 Hereditary deficiency of other clotting factors: Secondary | ICD-10-CM | POA: Diagnosis not present

## 2019-06-27 DIAGNOSIS — E43 Unspecified severe protein-calorie malnutrition: Secondary | ICD-10-CM | POA: Diagnosis not present

## 2019-06-28 DIAGNOSIS — I10 Essential (primary) hypertension: Secondary | ICD-10-CM | POA: Diagnosis not present

## 2019-06-28 DIAGNOSIS — F329 Major depressive disorder, single episode, unspecified: Secondary | ICD-10-CM | POA: Diagnosis not present

## 2019-06-28 DIAGNOSIS — I4891 Unspecified atrial fibrillation: Secondary | ICD-10-CM | POA: Diagnosis not present

## 2019-06-28 DIAGNOSIS — E43 Unspecified severe protein-calorie malnutrition: Secondary | ICD-10-CM | POA: Diagnosis not present

## 2019-06-28 DIAGNOSIS — R1310 Dysphagia, unspecified: Secondary | ICD-10-CM | POA: Diagnosis not present

## 2019-06-28 DIAGNOSIS — D682 Hereditary deficiency of other clotting factors: Secondary | ICD-10-CM | POA: Diagnosis not present

## 2019-07-13 ENCOUNTER — Ambulatory Visit: Payer: Medicare Other | Admitting: Neurology

## 2019-07-26 DEATH — deceased
